# Patient Record
Sex: Male | Born: 1960 | Race: Asian | Hispanic: No | Marital: Single | Smoking: Former smoker
Health system: Southern US, Community
[De-identification: ages and names within clinical notes are randomized; demographics above are authoritative.]

## PROBLEM LIST (undated history)

## (undated) DIAGNOSIS — R197 Diarrhea, unspecified: Secondary | ICD-10-CM

## (undated) DIAGNOSIS — J90 Pleural effusion, not elsewhere classified: Secondary | ICD-10-CM

## (undated) DIAGNOSIS — H9319 Tinnitus, unspecified ear: Secondary | ICD-10-CM

## (undated) DIAGNOSIS — R7611 Nonspecific reaction to tuberculin skin test without active tuberculosis: Secondary | ICD-10-CM

## (undated) DIAGNOSIS — K635 Polyp of colon: Secondary | ICD-10-CM

## (undated) DIAGNOSIS — R200 Anesthesia of skin: Secondary | ICD-10-CM

## (undated) DIAGNOSIS — K5732 Diverticulitis of large intestine without perforation or abscess without bleeding: Secondary | ICD-10-CM

## (undated) DIAGNOSIS — K529 Noninfective gastroenteritis and colitis, unspecified: Secondary | ICD-10-CM

## (undated) DIAGNOSIS — N200 Calculus of kidney: Secondary | ICD-10-CM

## (undated) DIAGNOSIS — I10 Essential (primary) hypertension: Secondary | ICD-10-CM

## (undated) HISTORY — DX: Pleural effusion, not elsewhere classified: J90

## (undated) HISTORY — DX: Diarrhea, unspecified: R19.7

## (undated) HISTORY — DX: Tinnitus, unspecified ear: H93.19

## (undated) HISTORY — DX: Anesthesia of skin: R20.0

## (undated) HISTORY — DX: Calculus of kidney: N20.0

## (undated) HISTORY — DX: Polyp of colon: K63.5

## (undated) HISTORY — DX: Nonspecific reaction to tuberculin skin test without active tuberculosis: R76.11

## (undated) HISTORY — PX: COLON SURGERY: SHX602

## (undated) HISTORY — DX: Essential (primary) hypertension: I10

---

## 2001-11-30 HISTORY — PX: APPENDECTOMY: SHX54

## 2002-05-08 ENCOUNTER — Encounter (INDEPENDENT_AMBULATORY_CARE_PROVIDER_SITE_OTHER): Payer: Self-pay

## 2002-05-09 ENCOUNTER — Inpatient Hospital Stay (HOSPITAL_COMMUNITY): Admission: EM | Admit: 2002-05-09 | Discharge: 2002-05-12 | Payer: Self-pay | Admitting: Emergency Medicine

## 2003-06-25 ENCOUNTER — Encounter: Payer: Self-pay | Admitting: Surgery

## 2003-06-25 ENCOUNTER — Encounter: Admission: RE | Admit: 2003-06-25 | Discharge: 2003-06-25 | Payer: Self-pay | Admitting: Surgery

## 2008-04-25 ENCOUNTER — Encounter: Admission: RE | Admit: 2008-04-25 | Discharge: 2008-04-25 | Payer: Self-pay | Admitting: Otolaryngology

## 2009-12-31 DIAGNOSIS — K5732 Diverticulitis of large intestine without perforation or abscess without bleeding: Secondary | ICD-10-CM | POA: Diagnosis present

## 2009-12-31 HISTORY — PX: RIGHT COLECTOMY: SHX853

## 2009-12-31 HISTORY — DX: Diverticulitis of large intestine without perforation or abscess without bleeding: K57.32

## 2009-12-31 HISTORY — PX: CHOLECYSTECTOMY: SHX55

## 2010-01-10 ENCOUNTER — Emergency Department (HOSPITAL_COMMUNITY): Admission: EM | Admit: 2010-01-10 | Discharge: 2010-01-10 | Payer: Self-pay | Admitting: Emergency Medicine

## 2010-01-19 ENCOUNTER — Inpatient Hospital Stay (HOSPITAL_COMMUNITY): Admission: EM | Admit: 2010-01-19 | Discharge: 2010-01-28 | Payer: Self-pay | Admitting: Emergency Medicine

## 2010-01-23 HISTORY — PX: COLON SURGERY: SHX602

## 2010-01-24 ENCOUNTER — Encounter (INDEPENDENT_AMBULATORY_CARE_PROVIDER_SITE_OTHER): Payer: Self-pay

## 2010-06-05 ENCOUNTER — Encounter: Admission: RE | Admit: 2010-06-05 | Discharge: 2010-06-05 | Payer: Self-pay | Admitting: General Surgery

## 2010-06-06 ENCOUNTER — Ambulatory Visit: Payer: Self-pay | Admitting: Internal Medicine

## 2010-06-06 ENCOUNTER — Other Ambulatory Visit: Admission: RE | Admit: 2010-06-06 | Discharge: 2010-06-06 | Payer: Self-pay | Admitting: Internal Medicine

## 2010-06-06 DIAGNOSIS — J90 Pleural effusion, not elsewhere classified: Secondary | ICD-10-CM | POA: Insufficient documentation

## 2010-06-07 LAB — CONVERTED CEMR LAB: LD, Fluid: 482 units/L — ABNORMAL HIGH (ref 3–23)

## 2010-06-16 ENCOUNTER — Telehealth: Payer: Self-pay | Admitting: Internal Medicine

## 2010-06-18 ENCOUNTER — Encounter: Payer: Self-pay | Admitting: Internal Medicine

## 2010-07-24 ENCOUNTER — Ambulatory Visit: Payer: Self-pay | Admitting: Internal Medicine

## 2010-08-22 ENCOUNTER — Ambulatory Visit: Payer: Self-pay | Admitting: Internal Medicine

## 2010-08-22 DIAGNOSIS — R0600 Dyspnea, unspecified: Secondary | ICD-10-CM | POA: Insufficient documentation

## 2010-08-25 ENCOUNTER — Encounter: Payer: Self-pay | Admitting: Internal Medicine

## 2010-08-25 LAB — CONVERTED CEMR LAB
ALT: 11 units/L (ref 0–53)
AST: 17 units/L (ref 0–37)
Albumin: 3.9 g/dL (ref 3.5–5.2)
Alkaline Phosphatase: 82 units/L (ref 39–117)
BUN: 9 mg/dL (ref 6–23)
Basophils Absolute: 0 10*3/uL (ref 0.0–0.1)
Basophils Relative: 0.3 % (ref 0.0–3.0)
Bilirubin, Direct: 0.2 mg/dL (ref 0.0–0.3)
CO2: 30 meq/L (ref 19–32)
Calcium: 9.5 mg/dL (ref 8.4–10.5)
Chloride: 99 meq/L (ref 96–112)
Creatinine, Ser: 0.9 mg/dL (ref 0.4–1.5)
Eosinophils Absolute: 0.2 10*3/uL (ref 0.0–0.7)
Eosinophils Relative: 2.1 % (ref 0.0–5.0)
GFR calc non Af Amer: 95.96 mL/min (ref >60)
Glucose, Bld: 80 mg/dL (ref 70–99)
HCT: 38.1 % — ABNORMAL LOW (ref 39.0–52.0)
Hemoglobin: 12.5 g/dL — ABNORMAL LOW (ref 13.0–17.0)
Lymphocytes Relative: 10.7 % — ABNORMAL LOW (ref 12.0–46.0)
Lymphs Abs: 0.9 10*3/uL (ref 0.7–4.0)
MCHC: 32.9 g/dL (ref 30.0–36.0)
MCV: 88.6 fL (ref 78.0–100.0)
Monocytes Absolute: 0.6 10*3/uL (ref 0.1–1.0)
Monocytes Relative: 6.5 % (ref 3.0–12.0)
Neutro Abs: 7 10*3/uL (ref 1.4–7.7)
Neutrophils Relative %: 80.4 % — ABNORMAL HIGH (ref 43.0–77.0)
Platelets: 445 10*3/uL — ABNORMAL HIGH (ref 150.0–400.0)
Potassium: 3.9 meq/L (ref 3.5–5.1)
RBC: 4.3 M/uL (ref 4.22–5.81)
RDW: 14.4 % (ref 11.5–14.6)
Sed Rate: 55 mm/hr — ABNORMAL HIGH (ref 0–22)
Sodium: 138 meq/L (ref 135–145)
TSH: 0.37 microintl units/mL (ref 0.35–5.50)
Total Bilirubin: 0.7 mg/dL (ref 0.3–1.2)
Total Protein: 7.9 g/dL (ref 6.0–8.3)
WBC: 8.7 10*3/uL (ref 4.5–10.5)

## 2010-09-05 ENCOUNTER — Ambulatory Visit (HOSPITAL_COMMUNITY): Admission: RE | Admit: 2010-09-05 | Discharge: 2010-09-05 | Payer: Self-pay | Admitting: Internal Medicine

## 2010-09-11 ENCOUNTER — Ambulatory Visit: Payer: Self-pay | Admitting: Internal Medicine

## 2010-09-11 LAB — CONVERTED CEMR LAB: Sed Rate: 41 mm/hr — ABNORMAL HIGH (ref 0–22)

## 2010-10-09 ENCOUNTER — Ambulatory Visit: Payer: Self-pay | Admitting: Internal Medicine

## 2010-10-09 LAB — CONVERTED CEMR LAB: Sed Rate: 29 mm/hr — ABNORMAL HIGH (ref 0–22)

## 2010-11-04 ENCOUNTER — Encounter: Payer: Self-pay | Admitting: Internal Medicine

## 2010-11-05 ENCOUNTER — Ambulatory Visit: Payer: Self-pay | Admitting: Internal Medicine

## 2010-12-04 ENCOUNTER — Telehealth (INDEPENDENT_AMBULATORY_CARE_PROVIDER_SITE_OTHER): Payer: Self-pay | Admitting: *Deleted

## 2010-12-04 ENCOUNTER — Encounter
Admission: RE | Admit: 2010-12-04 | Discharge: 2010-12-04 | Payer: Self-pay | Source: Home / Self Care | Attending: Specialist | Admitting: Specialist

## 2010-12-08 ENCOUNTER — Ambulatory Visit
Admission: RE | Admit: 2010-12-08 | Discharge: 2010-12-08 | Payer: Self-pay | Source: Home / Self Care | Attending: Adult Health | Admitting: Adult Health

## 2010-12-08 DIAGNOSIS — R7611 Nonspecific reaction to tuberculin skin test without active tuberculosis: Secondary | ICD-10-CM

## 2010-12-08 HISTORY — DX: Nonspecific reaction to tuberculin skin test without active tuberculosis: R76.11

## 2010-12-10 ENCOUNTER — Ambulatory Visit
Admission: RE | Admit: 2010-12-10 | Discharge: 2010-12-10 | Payer: Self-pay | Source: Home / Self Care | Attending: Critical Care Medicine | Admitting: Critical Care Medicine

## 2010-12-10 DIAGNOSIS — R7611 Nonspecific reaction to tuberculin skin test without active tuberculosis: Secondary | ICD-10-CM | POA: Insufficient documentation

## 2010-12-11 ENCOUNTER — Telehealth: Payer: Self-pay | Admitting: Internal Medicine

## 2010-12-16 ENCOUNTER — Ambulatory Visit: Admit: 2010-12-16 | Payer: Self-pay | Admitting: Thoracic Surgery

## 2010-12-22 ENCOUNTER — Ambulatory Visit
Admission: RE | Admit: 2010-12-22 | Discharge: 2010-12-22 | Payer: Self-pay | Source: Home / Self Care | Attending: Internal Medicine | Admitting: Internal Medicine

## 2010-12-30 NOTE — Assessment & Plan Note (Signed)
Summary: Pulmonary/ ext f/u for L Pleural effusion    Copy to:  Dr. Johna Sheriff Primary Provider/Referring Provider:  none  CC:  Followup on thoracentesis results.  Pt states still has some chest discomfort and tightness- worse with inspiration.  He denies SOB or cough.  Appetite is poor.[name] Kitchen  History of Present Illness: 50 yo vietnamese male quit smoking 2010 extremely difficult hx due to language barrier, even thru fm member serving as interpreter  June 06, 2010  1st pulmonary office eval for large L effusion and co fatigue and sob x months. eval by [facility] with abd ct showing large effusion.  minimal L chest discomfort, wt loss ? amt. no cough or leg swelling. no fever or sweats rec tap -   600 cc serous yellow exudate 94% L  July 24, 2010 Followup on thoracentesis results.  Pt states still has some chest discomfort and tightness- worse with inspiration.  He denies SOB or cough.  Appetite is poor. Pt denies any significant sore throat, dysphagia, itching, sneezing,  nasal congestion or excess secretions,  fever, chills, sweats, unintended wt loss, pleuritic or exertional cp, hempoptysis, variability  in activity tolerance  orthopnea pnd or leg swelling. Pt also denies any obvious fluctuation in symptoms with weather or environmental change or other alleviating or aggravating factors.       Current Medications (verified): 1)  Tylenol 325 Mg Tabs (Acetaminophen) .... Per Bottle Instructions As Needed  Allergies (verified): No Known Drug Allergies  Past History:  Past Medical History: Diverticulosis     - s/p hemicolectomy 01/23/2010 L Pleural effusion     - Tcentesis June 06, 2010  600 cc serous yellow exudate 94% L     - Small/ moderated residual effuision July 24, 2010  Kidney stones      - bilateral, non obstructing by CT 06/05/10  Vital Signs:  Patient profile:   50 year old male Weight:      125.38 pounds O2 Sat:      95 % on Room air Temp:     98.1 degrees F oral Pulse  rate:   97 / minute BP sitting:   132 / 84  (right arm)  Vitals Entered By: [name] (July 24, 2010 9:03 AM)  O2 Flow:  Room air  Physical Exam  Additional Exam:  amb thin vietmamese male doesn't speak any English wt 129 > 125 July 24, 2010  HEENT: nl dentition, turbinates, and orophanx. Nl external ear canals without cough reflex NECK :  without JVD/Nodes/TM/ nl carotid upstrokes bilaterally LUNGS: no acc muscle use, decreased bs on left with dullness 2/3 up, without cough on insp or exp maneuvers CV:  RRR  no s3 or murmur or increase in P2, no edema  ABD:  soft and nontender with nl excursion in the supine position. No bruits or organomegaly, bowel sounds nl MS:  warm without deformities, calf tenderness, cyanosis or clubbing SKIN: warm and dry without lesions       CXR  Procedure date:  07/24/2010  Findings:      IMPRESSION: Significant decrease in size of the still large left pleural effusion since the examination 5 weeks ago.  Improved aeration in the left lower lobe and lingula, with moderate passive atelectasis persisting.  No new abnormalities.  CXR  Procedure date:  07/24/2010  Findings:      Significant decrease in size of the still large left pleural effusion since the examination 5 weeks ago.  Improved aeration in  the left lower lobe and lingula, with moderate passive atelectasis persisting.  No new abnormalities.  Impression & Recommendations:  Problem # 1:  PLEURAL EFFUSION (ICD-511.9) Recurrent but very slowly reaccumlulating and no need for intervention to day.  May benefit from flow cytometry on return but need him to come in with family member who speaks English to explain options or will need to arrange for interpreter in advance  DDX includes late parapneumonic, lymphoma or TB in that order.  Medications Added to Medication List This Visit: 1)  Tylenol 325 Mg Tabs (Acetaminophen) .... Per bottle instructions as needed  Other  Orders: T-2 View CXR (71020TC) Est. Patient Level IV (56213)  Patient Instructions: 1)  Please schedule a follow-up appointment in 3 weeks, sooner if needed with cxr on return and someone who speaks English to serve as interpreter

## 2010-12-30 NOTE — Assessment & Plan Note (Signed)
Summary: Pulmonary/ ext f/u with mod L effusion, esr 55/ ? vcd   Copy to:  Dr. Johna Sheriff Primary Provider/Referring Provider:  none  CC:  3 wk followup with cxr.  Pt c/o increased SOB- esp at in the early evening.  Pt states that he is SOB without any exertion at all.  Pt states has no appetite.  [name] Kitchen  History of Present Illness: 50 yo vietnamese male quit smoking 2010 extremely difficult hx due to language barrier, even thru fm member serving as interpreter  June 06, 2010  1st pulmonary office eval for large L effusion and co fatigue and sob x months. eval by [facility] with abd ct showing large effusion.  minimal L chest discomfort, wt loss ? amt. no cough or leg swelling. no fever or sweats rec tap -   600 cc serous yellow exudate 94% L> mature lymphocytes, rec flow cytometry if recurs.  July 24, 2010 Followup on thoracentesis results.  Pt states still has some chest discomfort and tightness- worse with inspiration.  He denies SOB or cough.  Appetite is poor.  cxr no real change, rec return in 3 weeks   August 22, 2010 3 wk followup with cxr.  Pt c/o increased SOB- esp at in the early evening.  Pt states that he is SOB without any exertion at all assoc with centralized cp with coughing only.   Pt states has no appetite.  Pt denies any significant sore throat, dysphagia, itching, sneezing,  nasal congestion or excess secretions,  fever, chills, sweats, unintended wt loss, pleuritic or exertional cp, hempoptysis, change in activity tolerance  orthopnea pnd or leg swelling    Current Medications (verified): 1)  Tylenol 325 Mg Tabs (Acetaminophen) .... Per Bottle Instructions As Needed  Allergies (verified): No Known Drug Allergies  Past History:  Past Medical History: Diverticulosis     - s/p hemicolectomy 01/23/2010 L Pleural effusion     - Tcentesis June 06, 2010  600 cc serous yellow exudate 94% L> mature lymphocytes     - Small/ moderated residual effuision July 24, 2010  Kidney  stones      - bilateral, non obstructing by CT 06/05/10  Vital Signs:  Patient profile:   50 year old male Weight:      123.38 pounds O2 Sat:      99 % on Room air Temp:     98.1 degrees F oral Pulse rate:   106 / minute BP sitting:   122 / 74  (left arm)  Vitals Entered By: [name] (August 22, 2010 11:44 AM)  O2 Flow:  Room air  Physical Exam  Additional Exam:  amb thin vietmamese male doesn't speak any English, hopeless and helpless affect/attitude with classic pseudowheeze resolves with purse lip maneuver  wt 129 > 125 July 24, 2010 > 123 August 22, 2010  HEENT: nl dentition, turbinates, and orophanx. Nl external ear canals without cough reflex NECK :  without JVD/Nodes/TM/ nl carotid upstrokes bilaterally LUNGS: no acc muscle use, decreased bs on left with dullness base only, without cough on insp or exp maneuvers CV:  RRR  no s3 or murmur or increase in P2, no edema  ABD:  soft and nontender with nl excursion in the supine position. No bruits or organomegaly, bowel sounds nl MS:  warm without deformities, calf tenderness, cyanosis or clubbing SKIN: warm and dry without lesions        Sodium  138 mEq/L                   135-145   Potassium                 3.9 mEq/L                   3.5-5.1   Chloride                  99 mEq/L                    96-112   Carbon Dioxide            30 mEq/L                    19-32   Glucose                   80 mg/dL                    04-54   BUN                       9 mg/dL                     0-98   Creatinine                0.9 mg/dL                   1.1-9.1   Calcium                   9.5 mg/dL                   4.7-82.9   GFR                       95.96 mL/min                >60  Tests: (2) CBC Platelet w/Diff (CBCD)   White Cell Count          8.7 K/uL                    4.5-10.5   Red Cell Count            4.30 Mil/uL                 4.22-5.81   Hemoglobin           [L]  12.5 g/dL                    56.2-13.0   Hematocrit           [L]  38.1 %                      39.0-52.0   MCV                       88.6 fl                     78.0-100.0   MCHC                      32.9 g/dL  30.0-36.0   RDW                       14.4 %                      11.5-14.6   Platelet Count       [H]  445.0 K/uL                  150.0-400.0   Neutrophil %         [H]  80.4 %                      43.0-77.0   Lymphocyte %         [L]  10.7 %                      12.0-46.0   Monocyte %                6.5 %                       3.0-12.0   Eosinophils%              2.1 %                       0.0-5.0   Basophils %               0.3 %                       0.0-3.0   Neutrophill Absolute      7.0 K/uL                    1.4-7.7   Lymphocyte Absolute       0.9 K/uL                    0.7-4.0   Monocyte Absolute         0.6 K/uL                    0.1-1.0  Eosinophils, Absolute                             0.2 K/uL                    0.0-0.7   Basophils Absolute        0.0 K/uL                    0.0-0.1  Tests: (3) Hepatic/Liver Function Panel (HEPATIC)   Total Bilirubin           0.7 mg/dL                   1.6-1.0   Direct Bilirubin          0.2 mg/dL                   9.6-0.4   Alkaline Phosphatase      82 U/L                      39-117   AST  17 U/L                      0-37   ALT                       11 U/L                      0-53   Total Protein             7.9 g/dL                    7.8-2.9   Albumin                   3.9 g/dL                    5.6-2.1  Tests: (4) TSH (TSH)   FastTSH                   0.37 uIU/mL                 0.35-5.50  Tests: (5) Sed Rate (ESR)   Sed Rate             [H]  55 mm/hr                    0-22  CXR  Procedure date:  08/22/2010  Findings:      No significant interval change in appearance of the chest with a moderate-sized left pleural effusion and associated compressive atelectasis.  Impression &  Recommendations:  Problem # 1:  PLEURAL EFFUSION (ICD-511.9) Chronic by cell count showing predominantly mature lymphs though have not excluded low grade lymphoma here and note esr is moderately elevated but don't have anything to compare this to.  It is not rapidly reaccumulating which would be typical of late organized parapneumonic process.  Only small amt posteriorly on lateral view with loops of bowel near it, not sure there is truly much raccumulation here - needs to return with lateral decub view  Problem # 2:  DYSPNEA (ICD-786.09)  Much more c/w airway problem than effusion, assoc with somewhat atypical centralized cp and vague gi c/os ? all gerd related  Try ppi two times a day x 2 weeks plus diet  Orders: Est. Patient Level IV (30865)  Medications Added to Medication List This Visit: 1)  Omeprazole 20 Mg Cpdr (Omeprazole) .... Take one 30-60 min before first and last meals of the day  Other Orders: Radiology Referral (Radiology) T-2 View CXR (71020TC) TLB-BMP (Basic Metabolic Panel-BMET) (80048-METABOL) TLB-CBC Platelet - w/Differential (85025-CBCD) TLB-Hepatic/Liver Function Pnl (80076-HEPATIC) TLB-TSH (Thyroid Stimulating Hormone) (84443-TSH) TLB-Sedimentation Rate (ESR) (85652-ESR)  Patient Instructions: 1)  Omeprazole 20 mg Take one 30-60 min before first and last meals of the day  2)  GERD (REFLUX)  is a common cause of respiratory symptoms. It commonly presents without heartburn and can be treated with medication, but also with lifestyle changes including avoidance of late meals, excessive alcohol, smoking cessation, and avoid fatty foods, chocolate, peppermint, colas, red wine, and acidic juices such as orange juice. NO MINT OR MENTHOL PRODUCTS SO NO COUGH DROPS  3)  USE SUGARLESS CANDY INSTEAD (jolley ranchers)  4)  NO OIL BASED VITAMINS 5)  Return in 2 weeks with a cxr to be done at [facility] a L Lateral decubitus  Prescriptions: OMEPRAZOLE 20 MG  CPDR  (OMEPRAZOLE) Take one 30-60 min before first and last meals of the day  #68 x 3   Entered and Authorized by:   Nyoka Cowden MD   Signed by:   Nyoka Cowden MD on 08/22/2010   Method used:   Print then Give to Patient   RxID:   [id number]

## 2010-12-30 NOTE — Assessment & Plan Note (Signed)
Summary: Pulmonary/ new pt eval for left effusion   Visit Type:  Initial Consult Copy to:  Dr. Johna Sheriff Primary Provider/Referring Provider:  none  CC:  Abnormal CT Chest.  History of Present Illness: 50 yo vietnamese male quit smoking 2010 extremely difficult hx due to language barrier, even thru fm member serving as interpreter  June 06, 2010  1st pulmonary office eval for large L effusion and co fatigue and sob x months. eval by [facility] with abd ct showing large effusion.  minimal L chest discomfort, wt loss ? amt. no cough or leg swelling. no fever or sweats  Current Medications (verified): 1)  None  Allergies (verified): No Known Drug Allergies  Past History:  Past Medical History: Diverticulosis     - s/p hemicolectomy 01/23/2010 L Pleural effusion     - Tcentesis June 06, 2010  600 cc serous yellow exudate 94% L Kidney stones      - bilateral, non obstructing by CT 06/05/10  Past Surgical History: Intestinal surgery 12/2009 = ileohemicolectomy and cholecystectomy Appendectomy 2003  Family History: no resp dz known  Social History: Single Children Former smoker.  Quit in 2010.  Smoked approx 40 yrs 1 ppd. 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  The patient denies shortness of breath with activity, productive cough, non-productive cough, coughing up blood, irregular heartbeats, acid heartburn, indigestion, abdominal pain, difficulty swallowing, sore throat, tooth/dental problems, nasal congestion/difficulty breathing through nose, sneezing, itching, ear ache, anxiety, depression, hand/feet swelling, joint stiffness or pain, rash, change in color of mucus, and fever.    Vital Signs:  Patient profile:   50 year old male Height:      63 inches Weight:      129 pounds BMI:     22.93 O2 Sat:      95 %  on Room air Temp:     98.2 degrees F oral Pulse rate:   105 / minute BP sitting:   94 / 60  (left arm)  Vitals Entered By: [name] (June 06, 2010 10:20 AM)  O2 Flow:  Room air  Physical Exam  Additional Exam:  amb thin vietmamese male doesn't speak any English HEENT: nl dentition, turbinates, and orophanx. Nl external ear canals without cough reflex NECK :  without JVD/Nodes/TM/ nl carotid upstrokes bilaterally LUNGS: no acc muscle use, decreased bs on left with dullness 2/3 up, without cough on insp or exp maneuvers CV:  RRR  no s3 or murmur or increase in P2, no edema  ABD:  soft and nontender with nl excursion in the supine position. No bruits or organomegaly, bowel sounds nl MS:  warm without deformities, calf tenderness, cyanosis or clubbing SKIN: warm and dry without lesions   NEURO:  alert, approp, no deficits   ! Color                     YELLOW                      YELLOW ! Clarity              [A]  HAZY                        CLEAR ! WBC                  [  H]  2680 cu mm                  0-1000 ! Neutrophil                4 %                         0-25 ! Lymphocyte                94 % ! Monocyte/Macrophage       1 % ! Eosinophil           [H]  1 %   Fluid Type                See Comment     UNKNOWN  Tests: (2) Glucose, Fluid (04540) ! Fluid Type                Y ! Glucose, Fluid            84 mg/dL     Fluid glucose levels of <60 mg/dL, or values of 40 mg/dL less than a     simultaneous serum level are considered decreased.  Tests: (3) Total Protein, Fluid (98119) ! Fluid Type                PLEURAL ! Total Protein, Fluid      5.3 g/dL  Tests: (4) LDH, Fluid (14782) ! Fluid Type                PLEURAL   LDH, Fluid           [H]  482 U/L                     3-23  Tests: (5) Triglycerides, Fluid (95621) ! Fluid Type                PLEURAL ! Triglyceride, Fluid       41 mg/dL  Tests: (6) Albumin, Fluid (30865) ! Albumin, Fluid            2.8 g/dL !  Fluid Type                PLEURAL  CXR  Procedure date:  06/06/2010  Findings:      Comparison: 01/10/2010   Findings: There is a new   large left pleural effusion.  There is significant consolidation / atelectasis in the left lower lung with air bronchograms.  Stable peripheral opacity at the left lung apex. Right lung clear.  Heart size difficult to assess due to adjacent opacity.  Vascular clips in the upper abdomen.   IMPRESSION:   1.  New   large left pleural effusion.    Impression & Recommendations:  Problem # 1:  PLEURAL EFFUSION (ICD-511.9) Sterile prep/ drape/1% xylocaine topically.  L thoracentesis performed using # 18 gauge needle at left base > 600 cc yellow serous fluid. Pt tolerated well, f/u cxr without pnemo    Findings: chronic exudative with predominant lymphocytes so could be tuberculous, malignant or late parapneumonic. Await cytology  Other Orders: T- * Misc. Laboratory test [id number]) T-2 View CXR (71020TC) New Patient Level III (62952) Thoracentesis  (84132)  Patient Instructions: 1)  we will call with reports

## 2010-12-30 NOTE — Assessment & Plan Note (Signed)
Summary: Pulmonary/ ext f/u ov with walking sats nl p 2 laps    Copy to:  Dr. Johna Sheriff Primary Corin Tilly/Referring Kellye Mizner:  none  CC:  Dyspnea- the same and no better or worse.Eduardo West  History of Present Illness: 50 yo vietnamese male quit smoking 2010 extremely difficult hx due to language barrier, even thru fm member serving as interpreter  June 06, 2010  1st pulmonary office eval for large L effusion and co fatigue and sob x months. eval by [facility] with abd ct showing large effusion.  minimal L chest discomfort, wt loss ? amt. no cough or leg swelling. no fever or sweats rec tap -   600 cc serous yellow exudate 94% L> mature lymphocytes, rec flow cytometry if recurs.  July 24, 2010 Followup on thoracentesis results.  Pt states still has some chest discomfort and tightness- worse with inspiration.  He denies SOB or cough.  Appetite is poor.  cxr no real change, rec return in 3 weeks   August 22, 2010 3 wk followup with cxr.  Pt c/o increased SOB- esp at in the early evening.  Pt states that he is SOB without any exertion at all assoc with centralized cp with coughing only. rec Omeprazole 20 mg Take one 30-60 min before first and last meals of the day  GERD  diet  September 11, 2010  cc Dyspnea- the same, no better or worse. not losing any weight.  no cough now. afraid to eat but not loosing any wt.  Pt denies any significant sore throat, dysphagia, itching, sneezing,  nasal congestion or excess secretions,  fever, chills, sweats, unintended wt loss, pleuritic or exertional cp, hempoptysis, change in activity tolerance  orthopnea pnd or leg swelling      Allergies (verified): No Known Drug Allergies  Past History:  Past Medical History: Diverticulosis     - s/p hemicolectomy 01/23/2010 L Pleural effusion     - Tcentesis June 06, 2010  600 cc serous yellow exudate 94% L>  mature lymphocytes     - Small/ moderated residual effusion  July 24, 2010 >> lat decub no sign layering  09/05/10 Kidney stones      - bilateral, non obstructing by CT 06/05/10  Vital Signs:  Patient profile:   50 year old male Weight:      124 pounds O2 Sat:      97 % on Room air Temp:     98.4 degrees F oral Pulse rate:   75 / minute BP sitting:   118 / 82  (left arm)  Vitals Entered By: [name] (September 11, 2010 11:32 AM)  O2 Flow:  Room air  Serial Vital Signs/Assessments:  Comments: 11:57 AM Ambulatory Pulse Oximetry  Resting; HR_78____    02 Sat_96%RA____  Lap1 (185 feet)   HR__96___   02 Sat_97%RA____ Lap2 (185 feet)   HR_90___   02 Sat_96%RA____    Lap3 (185 feet)   HR_____   02 Sat_____  ___Test Completed without Difficulty X__Test Stopped due ZO:XWRUEA 2 laps,c/o tired,requested to stop Elray Buba RN  September 11, 2010 11:59 AM    By: Elray Buba RN    Physical Exam  Additional Exam:  amb thin vietmamese male doesn't speak any English, hopeless and helpless affect/attitude with classic pseudowheeze resolves with purse lip maneuver  wt  125 July 24, 2010 > 123 August 22, 2010 > 124 September 11, 2010  HEENT: nl dentition, turbinates, and orophanx. Nl external ear canals without cough  reflex NECK :  without JVD/Nodes/TM/ nl carotid upstrokes bilaterally LUNGS: no acc muscle use, decreased bs on left with dullness base only, without cough on insp or exp maneuvers CV:  RRR  no s3 or murmur or increase in P2, no edema  ABD:  soft and nontender with nl excursion in the supine position. No bruits or organomegaly, bowel sounds nl MS:  warm without deformities, calf tenderness, cyanosis or clubbing SKIN: warm and dry without lesions       Sed Rate             [H]  41 mm/hr                    0-22  Impression & Recommendations:  Problem # 1:  PLEURAL EFFUSION (ICD-511.9)    Chronic by cell count showing predominantly mature lymphs though have not excluded low grade lymphoma here and note esr is moderately elevated but don't have anything to compare this  to.  It is not rapidly reaccumulating which would be typical of late organized parapneumonic process.  Only small amt posteriorly on lateral view with loops of bowel near it, not sure there is truly much raccumulation here -  lateral decub view > no free effusion  ESR  55  >>  41  September 11, 2010 so doubt lymphoma or empyema, follow conservatively  Problem # 2:  DYSPNEA (ICD-786.09)  Not able to reproduce this complaint, cough resolved, suspect this is all  Classic Upper airway cough syndrome, so named because it's frequently impossible to sort out how much is  CR/sinusitis with freq throat clearing (which can be related to primary GERD)   vs  causing  secondary extra esophageal GERD from wide swings in gastric pressure that occur with throat clearing, promoting self use of mint and menthol lozenges that reduce the lower esophageal sphincter tone and exacerbate the problem further These are the same pts who not infrequently have failed to tolerate ace inhibitors,  dry powder inhalers or biphosphonates or report having reflux symptoms that don't respond to standard doses of PPI   for now continue ppi high dose, f/u 4 weeks  Orders: Est. Patient Level IV (99214) Pulse Oximetry, Ambulatory (16109)  Other Orders: TLB-Sedimentation Rate (ESR) (85652-ESR)  Patient Instructions: 1)  Continue omeprazole 20mg   Take one 30-60 min before first and last meals of the day  2)  Please schedule a follow-up appointment in 4  weeks, sooner if needed  3)  Bring medications with you to all visits

## 2010-12-30 NOTE — Assessment & Plan Note (Signed)
Summary: Pulmonary/ f/u pleural effusion/ cp and sob    Copy to:  Dr. Johna Sheriff Primary Provider/Referring Provider:  none  CC:  4 week follow up.. Pt c/o increased SOB all the time with chest pain.  History of Present Illness: 50 yo vietnamese male quit smoking 2010 extremely difficult hx due to language barrier, even thru fm member serving as interpreter  June 06, 2010  1st pulmonary office eval for large L effusion and co fatigue and sob x months. eval by [facility] with abd ct showing large effusion.  minimal L chest discomfort, wt loss ? amt. no cough or leg swelling. no fever or sweats rec tap -   600 cc serous yellow exudate 94% L> mature lymphocytes, rec flow cytometry if recurs.  July 24, 2010 Followup on thoracentesis results.  Pt states still has some chest discomfort and tightness- worse with inspiration.  He denies SOB or cough.  Appetite is poor.  cxr no real change, rec return in 3 weeks   August 22, 2010 3 wk followup with cxr.  Pt c/o increased SOB- esp at in the early evening.  Pt states that he is SOB without any exertion at all assoc with centralized cp with coughing only. rec Omeprazole 20 mg Take one 30-60 min before first and last meals of the day  GERD  diet  September 11, 2010  cc Dyspnea- the same, no better or worse. not losing any weight.  no cough now. afraid to eat but not loosing any wt.  rec Continue omeprazole 20mg   Take one 30-60 min before first and last meals of the day  October 09, 2010  ov cp all the time but worse with deep breath and sob, not eating.  Pt denies any significant sore throat, dysphagia, itching, sneezing,  nasal congestion or excess secretions,  fever, chills, sweats, unintended wt loss,  exertional cp, hempoptysis, change in activity tolerance  orthopnea pnd or leg swelling. Pt also denies any obvious fluctuation in symptoms with weather or environmental change or other alleviating or aggravating factors.       Preventive  Screening-Counseling & Management  Alcohol-Tobacco     Smoking Status: quit     Packs/Day: 0.5     Year Quit: 2009  Current Medications (verified): 1)  Tylenol 325 Mg Tabs (Acetaminophen) .... Per Bottle Instructions As Needed 2)  Omeprazole 20 Mg  Cpdr (Omeprazole) .... Take One 30-60 Min Before First and Last Meals of The Day 3)  Oxycodone-Acetaminophen 5-325 Mg Tabs (Oxycodone-Acetaminophen) .... Take 1 To 2 Tablet By Mouth Every 4 Hours As Needed  Allergies (verified): No Known Drug Allergies   Past History:  Past Medical History: Diverticulosis     - s/p hemicolectomy 01/23/2010 L Pleural effusion     - Tcentesis June 06, 2010  600 cc serous yellow exudate 94% L>  mature lymphocytes     - Small/ moderated residual effusion  July 24, 2010 >> lat decub no sign layering 09/05/10     - ESR  55 > 40 > 29 October 09, 2010  Kidney stones      - bilateral, non obstructing by CT 06/05/10  Social History: Smoking Status:  quit Packs/Day:  0.5  Vital Signs:  Patient profile:   50 year old male Height:      63 inches Weight:      125.8 pounds BMI:     22.37 O2 Sat:      97 % on Room air Temp:  97.6 degrees F oral Pulse rate:   67 / minute BP sitting:   112 / 80  (right arm) Cuff size:   regular  Vitals Entered By: Zackery Barefoot CMA (October 09, 2010 11:11 AM)  O2 Flow:  Room air CC: 4 week follow up.. Pt c/o increased SOB all the time with chest pain Comments Medications reviewed with patient Verified contact number and pharmacy with patient Zackery Barefoot CMA  October 09, 2010 11:12 AM    Physical Exam  Additional Exam:  amb thin vietmamese male doesn't speak any English, hopeless and helpless affect/attitude with classic pseudowheeze resolves with purse lip maneuver  wt  125 July 24, 2010 > 123 August 22, 2010 > 124 September 11, 2010 > 125 October 09, 2010  HEENT: nl dentition, turbinates, and orophanx. Nl external ear canals without cough  reflex NECK :  without JVD/Nodes/TM/ nl carotid upstrokes bilaterally LUNGS: no acc muscle use, decreased bs on left with dullness base only, without cough on insp or exp maneuvers CV:  RRR  no s3 or murmur or increase in P2, no edema  ABD:  soft and nontender with nl excursion in the supine position. No bruits or organomegaly, bowel sounds nl MS:  warm without deformities, calf tenderness, cyanosis or clubbing SKIN: warm and dry without lesions     ESR = 29 (as 40 prev ov)  CXR  Procedure date:  10/09/2010  Findings:      No change in left effusion and basilar airspace disease  Impression & Recommendations:  Problem # 1:  PLEURAL EFFUSION (ICD-511.9)    ESR  55 > 40 > 29 October 09, 2010   Serial cxr's show if anything that his present status is improved vs the p thoracentesis view so doubt we can do any better here even with chest tube or vats, and there is no objective evidence of undrained pus and an unaddressed active pleural inflammatory issue.  Discussed in detail all the  indications, usual  risks and alternatives  relative to the benefits with patient who agrees to proceed with conservative rx.   Problem # 2:  DYSPNEA (ICD-786.09)  Not reproducible x 2 laps, suspect element of depression and deconditioning  Medications Added to Medication List This Visit: 1)  Oxycodone-acetaminophen 5-325 Mg Tabs (Oxycodone-acetaminophen) .... Take 1 to 2 tablet by mouth every 4 hours as needed  Other Orders: Pulse Oximetry, Ambulatory ([address]) Est. Patient Level IV (99214) T-2 View CXR (71020TC) TLB-Sedimentation Rate (ESR) (85652-ESR)  Patient Instructions: 1)   Please schedule a follow-up appointment in 4 weeks, sooner if needed with cxr on return

## 2010-12-30 NOTE — Letter (Signed)
Summary: [facility]  [address]   [place], [place] [address]   Phone: [phone]  Fax: [phone]    06/18/2010  KRISS ISHLER [address] [place], [place]  [address]  Dear Mr. SPANO,   Please call our office to schedule a followup appointment with Dr Sherene Sires to review your recent test results. Our number is [phone].        Sincerely,   [facility]

## 2010-12-30 NOTE — Assessment & Plan Note (Signed)
Summary: Pulmonary/ f/u  effusion > no change    Copy to:  Dr. Johna Sheriff Primary Provider/Referring Provider:  none  CC:  Dyspnea- " a little better"..  History of Present Illness: 50 yo vietnamese male quit smoking 2010 extremely difficult hx due to language barrier, even thru fm member serving as interpreter  June 06, 2010  1st pulmonary office eval for large L effusion and co fatigue and sob x months. eval by [facility] with abd ct showing large effusion.  minimal L chest discomfort, wt loss ? amt. no cough or leg swelling. no fever or sweats rec tap -   600 cc serous yellow exudate 94% L> mature lymphocytes, rec flow cytometry if recurs.  July 24, 2010 Followup on thoracentesis results.  Pt states still has some chest discomfort and tightness- worse with inspiration.  He denies SOB or cough.  Appetite is poor.  cxr no real change, rec return in 3 weeks   August 22, 2010 3 wk followup with cxr.  Pt c/o increased SOB- esp at in the early evening.  Pt states that he is SOB without any exertion at all assoc with centralized cp with coughing only. rec Omeprazole 20 mg Take one 30-60 min before first and last meals of the day  GERD  diet  September 11, 2010  cc Dyspnea- the same, no better or worse. not losing any weight.  no cough now. afraid to eat but not loosing any wt.  rec Continue omeprazole 20mg   Take one 30-60 min before first and last meals of the day  October 09, 2010  ov cp all the time but worse with deep breath and sob, not eating.   rec conservative f/u  November 05, 2010 ov no change doe/ cp  on ppi.  Pt denies any significant sore throat, dysphagia, itching, sneezing,  nasal congestion or excess secretions,  fever, chills, sweats, unintended wt loss,  classic pleuritic or exertional cp, hempoptysis, change in activity tolerance  orthopnea pnd or leg swelling   Current Medications (verified): 1)  Tylenol 325 Mg Tabs (Acetaminophen) .... Per Bottle Instructions As Needed 2)   Omeprazole 20 Mg  Cpdr (Omeprazole) .... Take One 30-60 Min Before First and Last Meals of The Day 3)  Oxycodone-Acetaminophen 5-325 Mg Tabs (Oxycodone-Acetaminophen) .... Take 1 To 2 Tablet By Mouth Every 4 Hours As Needed  Allergies (verified): No Known Drug Allergies  Past History:  Past Medical History: Diverticulosis     - s/p hemicolectomy 01/23/2010 L Pleural effusion     - Tcentesis June 06, 2010  600 cc serous yellow exudate 94% L>  mature lymphocytes     - Small/ moderated residual effusion  July 24, 2010 >>   lat decub no sign layering 09/05/10     - ESR  55 > 40 > 29 October 09, 2010  Kidney stones      - bilateral, non obstructing by CT 06/05/10  Vital Signs:  Patient profile:   50 year old male Weight:      126.13 pounds O2 Sat:      97 % on Room air Temp:     98.0 degrees F oral Pulse rate:   83 / minute BP sitting:   130 / 96  (right arm)  Vitals Entered By: [name] (November 05, 2010 11:04 AM)  O2 Flow:  Room air  Physical Exam  Additional Exam:  amb thin vietmamese male doesn't speak any English, hopeless and helpless  affect/attitude with classic pseudowheeze resolves with purse lip maneuver  wt  125 July 24, 2010 > 123 August 22, 2010 > 124 September 11, 2010 > 125 October 09, 2010 > 126 November 05, 2010  HEENT: nl dentition, turbinates, and orophanx. Nl external ear canals without cough reflex NECK :  without JVD/Nodes/TM/ nl carotid upstrokes bilaterally LUNGS: no acc muscle use, decreased bs on left with dullness base only, without cough on insp or exp maneuvers CV:  RRR  no s3 or murmur or increase in P2, no edema  ABD:  soft and nontender with nl excursion in the supine position. No bruits or organomegaly, bowel sounds nl MS:  warm without deformities, calf tenderness, cyanosis or clubbing SKIN: warm and dry without lesions       Impression & Recommendations:  Problem # 1:  PLEURAL EFFUSION (ICD-511.9)    ESR  55 > 40 > 29 October 09, 2010   no evidence of significant worsening clinically or radiographically > continue to follow conservatively  See instructions for specific recommendations   Orders: Est. Patient Level III ([address])  Patient Instructions: 1)  Please schedule a follow-up appointment in 6 weeks, sooner if needed  2)  Try off the opmeprazole to see if it makes any difference

## 2010-12-30 NOTE — Letter (Signed)
Summary: [facility]  [address]   [place], [place] [address]   Phone: [phone]  Fax: [phone]    08/25/2010  HOYT LEANOS [address] [place], [place]  [address]  Dear Mr. RUSSETT,   Please call our office at [phone] to obtain recent lab results. Thank You.        Sincerely,   [facility]

## 2010-12-30 NOTE — Miscellaneous (Signed)
Summary: Orders Update  Clinical Lists Changes  Orders: Added new Test order of T-2 View CXR (71020TC) - Signed 

## 2010-12-30 NOTE — Progress Notes (Signed)
Summary: Cytology still pending,  needs f/u ov----[id number]  Phone Note Call from Patient   Caller: Patient Call For: Gibril Mastro Summary of Call: pt wants lab results [phone] Initial call taken by: [name], CNA,  June 16, 2010 2:30 PM  Follow-up for Phone Call        pt calling for test results of thoracentesis done at OV with MW on 06-06-2010.  Please advise.  Thanks.  Aundra Millet Reynolds LPN  June 16, 2010 2:39 PM  cytology still pending, check results - he'll need f/u anyway this week so get him to come in Thursday am with cxr Follow-up by: Nyoka Cowden MD,  June 16, 2010 3:01 PM  Additional Follow-up for Phone Call Additional follow up Details #1::        ATC pt at # provided.  Line busy.  Will try back later.  Aundra Millet Reynolds LPN  June 16, 2010 3:02 PM     Additional Follow-up for Phone Call Additional follow up Details #2::    [id number].  Aundra Millet Reynolds LPN  June 16, 2010 3:53 PM   ATC pt back pt- someone answered but did not speak english and [place] up the phone- Will try again later [name]  June 17, 2010 11:15 AM   called number given and this lady keeps answering the phone and does not understand what i am saying and hangs up the phone.  will await for pt to call back for results [name] CMA  June 18, 2010 9:08 AM

## 2011-01-01 NOTE — Assessment & Plan Note (Signed)
Summary: Acute NP office visit - dyspnea   Copy to:  Dr. Johna Sheriff Primary Provider/Referring Provider:  none  CC:  acute ov today for SOB especially at night, some heaviness in chest, and occ dry cough - worse since last ov x1week.  History of Present Illness: 50 yo vietnamese male quit smoking 2010 extremely difficult hx due to language barrier, even thru fm member serving as interpreter  June 06, 2010  1st pulmonary office eval for large L effusion and co fatigue and sob x months. eval by [facility] with abd ct showing large effusion.  minimal L chest discomfort, wt loss ? amt. no cough or leg swelling. no fever or sweats rec tap -   600 cc serous yellow exudate 94% L> mature lymphocytes, rec flow cytometry if recurs.  July 24, 2010 Followup on thoracentesis results.  Pt states still has some chest discomfort and tightness- worse with inspiration.  He denies SOB or cough.  Appetite is poor.  cxr no real change, rec return in 3 weeks   August 22, 2010 3 wk followup with cxr.  Pt c/o increased SOB- esp at in the early evening.  Pt states that he is SOB without any exertion at all assoc with centralized cp with coughing only. rec Omeprazole 20 mg Take one 30-60 min before first and last meals of the day  GERD  diet  September 11, 2010  cc Dyspnea- the same, no better or worse. not losing any weight.  no cough now. afraid to eat but not loosing any wt.  rec Continue omeprazole 20mg   Take one 30-60 min before first and last meals of the day  October 09, 2010  ov cp all the time but worse with deep breath and sob, not eating.   rec conservative f/u  November 05, 2010 ov no change doe/ cp  on ppi.    December 08, 2010 --Presents for an acute office visit.Complains of  SOB especially at night, some heaviness in chest, occ dry cough. Comes and goes. Was seen by PCP last week and sent for xray which showed no change in left effusion. He cont to feel bad w/no energy.Cough persists w/ intermittent  fevers, pain in left rib area. Interpreter from [employer] is here. Denies orthopnea, hemoptysis, fever, n/v/d, edema, headache.  Medications Prior to Update: 1)  Tylenol 325 Mg Tabs (Acetaminophen) .... Per Bottle Instructions As Needed 2)  Oxycodone-Acetaminophen 5-325 Mg Tabs (Oxycodone-Acetaminophen) .... Take 1 To 2 Tablet By Mouth Every 4 Hours As Needed  Current Medications (verified): 1)  Tylenol 325 Mg Tabs (Acetaminophen) .... Per Bottle Instructions As Needed 2)  Oxycodone-Acetaminophen 5-325 Mg Tabs (Oxycodone-Acetaminophen) .... Take 1 To 2 Tablet By Mouth Every 4 Hours As Needed  Allergies (verified): No Known Drug Allergies  Past History:  Past Surgical History: Last updated: 06/06/2010 Intestinal surgery 12/2009 = ileohemicolectomy and cholecystectomy Appendectomy 2003  Family History: Last updated: 06/06/2010 no resp dz known  Social History: Last updated: 12/08/2010 Single Children Former smoker.  Quit in 2010.  Smoked approx 40 yrs 1 ppd. 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 abscess2/24/2011  L Pleural effusion     - Tcentesis June 06, 2010  600 cc serous yellow exudate 94% L>  mature lymphocytes     - Small/ moderated residual effusion  July 24, 2010 >>   lat decub no sign layering 09/05/10     - ESR  55 > 40 > 29 October 09, 2010  Kidney stones      - bilateral, non obstructing by CT 06/05/10  Social History: Single Children Former smoker.  Quit in 2010.  Smoked approx 40 yrs 1 ppd. 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 Has been in [place] since 1992 Does not speak english  4 people live in home (kids/girlfriend)   Review of Systems      See  HPI  Vital Signs:  Patient profile:   50 year old male Height:      63 inches Weight:      129.38 pounds BMI:     23.00 O2 Sat:      98 % on Room air Temp:     97.9 degrees F oral Pulse rate:   89 / minute BP sitting:   144 / 84  (left arm) Cuff size:   regular  Vitals Entered By: Boone Master CNA/MA (December 08, 2010 9:04 AM)  O2 Flow:  Room air CC: acute ov today for SOB especially at night, some heaviness in chest, occ dry cough - worse since last ov x1week Is Patient Diabetic? No Comments Medications reviewed with patient Daytime contact number verified with patient. Boone Master CNA/MA  December 08, 2010 9:05 AM    Physical Exam  Additional Exam:  amb thin vietmamese male doesn't speak any English, hopeless and helpless affect/attitude   wt  125 July 24, 2010 > 123 August 22, 2010 > 124 September 11, 2010 > 125 October 09, 2010 > 126 November 05, 2010 >129 December 08, 2010 HEENT: nl dentition, turbinates, and orophanx. Nl external ear canals without cough reflex NECK :  without JVD/Nodes/TM/ nl carotid upstrokes bilaterally LUNGS: coarse BS w/ diminshed BS in bases, tender along mid and lateral chest wall.  CV:  RRR  no s3 or murmur or increase in P2, no edema  ABD:  soft and nontender with nl excursion in the supine position. No bruits or organomegaly, bowel sounds nl MS:  warm without deformities, calf tenderness, cyanosis or clubbing SKIN: warm and dry without lesions       Impression & Recommendations:  Problem # 1:  PLEURAL EFFUSION (ICD-511.9)  Persistent left pleural effusion ?associated infection  will tx w/ abx-Avelox  warm heat /nsaids.  place PPD  Plan:  You will need to return on 12/10/10- Wednesday this week.  Warm heat to chest ,  Advil 200mg  2 tabs two times a day for 5 days Avelox 400mg  once daily for 7 days  follow up Dr. Sherene Sires in 1 week  Please contact office for sooner follow up if symptoms do not improve or worsen   Orders: Est.  Patient Level IV (04540)  Medications Added to Medication List This Visit: 1)  Avelox 400 Mg Tabs (Moxifloxacin hcl) .[name] Kitchen.. 1 by mouth once daily  Patient Instructions: 1)  You will need to return on 12/10/10- Wednesday this week.  2)  Warm heat to chest ,  3)  Advil 200mg  2 tabs two times a day for 5 days 4)  Avelox 400mg  once daily for 7 days  5)  follow up Dr. Sherene Sires in 1 week  6)  Please contact office for sooner follow  up if symptoms do not improve or worsen  Prescriptions: AVELOX 400 MG TABS (MOXIFLOXACIN HCL) 1 by mouth once daily  #7 x 0   Entered and Authorized by:   Rubye Oaks NP   Signed by:   Rubye Oaks NP on 12/08/2010   Method used:   Electronically to        [facility]. [phone]* (retail)       [address]       [place], [place]  [address]       Ph: [phone]       Fax: [phone]   RxID:   [id number]   Appended Document: Acute NP office visit - dyspnea    Clinical Lists Changes  Medications: Rx of AVELOX 400 MG TABS (MOXIFLOXACIN HCL) 1 by mouth once daily;  #7 x 0;  Signed;  Entered by: Boone Master CNA/MA;  Authorized by: Mishel Sans NP;  Method used: Reprint    Prescriptions: AVELOX 400 MG TABS (MOXIFLOXACIN HCL) 1 by mouth once daily  #7 x 0   Entered by:   Boone Master CNA/MA   Authorized by:   Rubye Oaks NP   Signed by:   Boone Master CNA/MA on 12/08/2010   Method used:   Reprint   RxID:   [id number]   pt requested a hard-copy of his rx for avelox as he has to provide ID.  rx printed, signed by TP and handed to pt. Boone Master CNA/MA  December 08, 2010 10:07 AM   Appended Document: PPD placement    Clinical Lists Changes  Orders: Added new Service order of TB Skin Test [phone]) - Signed Added new Service order of Admin 1st Vaccine (56433) - Signed Observations: Added new observation of TB PPDRESULT: positive (12/08/2010 10:11) Added new observation of PPD RESULT: > 15mm  (12/08/2010 10:11) Added new observation of TB-PPD RDDTE: 12/10/2010 (12/08/2010 10:11) Added new observation of TB-PPD LOT#: [id number] (12/08/2010 10:11) Added new observation of TB-PPD EXP: 03/25/2012 (12/08/2010 10:11) Added new observation of TB-PPD BY: Boone Master CNA/MA (12/08/2010 10:11) Added new observation of TB-PPD RTE: ID (12/08/2010 10:11) Added new observation of TB-PPD DSE: 0.1 ml (12/08/2010 10:11) Added new observation of TB-PPD MFR: Sanofi Pasteur (12/08/2010 10:11) Added new observation of TB-PPD SITE: right forearm (12/08/2010 10:11) Added new observation of TB-PPD: PPD (12/08/2010 10:11)       Immunizations Administered:  PPD Skin Test:    Vaccine Type: PPD    Site: right forearm    Mfr: Sanofi Pasteur    Dose: 0.1 ml    Route: ID    Given by: Boone Master CNA/MA    Exp. Date: 03/25/2012    Lot #: [id number]  PPD Results    Date of reading: 12/10/2010    Results: > 15mm    Interpretation: positive   Appended Document: Acute NP office visit - dyspnea Pt was seen by Dr. Delford Field today for positive PPD.

## 2011-01-01 NOTE — Progress Notes (Signed)
Summary: pleural effusion- dr Mayford Knife office calling  Phone Note From Other Clinic   Caller: Sander Radon w/ dr Karren Burly williams  Call For: wert Summary of Call: pt had a cxr today per caller that shows pleural effusion. caller wants to know if dr wert was going to have a thoracentesis set up. (pt having SOB). call Sander Radon at [phone] asap Initial call taken by: [name], CNA,  December 04, 2010 1:59 PM  Follow-up for Phone Call        Spoke with Pink.  Pt seeing Dr Mayford Knife as PCP now.  Was seen there today, having some SOB and cxr was ordered.  Dr Mayford Knife was wanting to know if MW planned to do another thora any time soon.  I advised that we just saw the pt x 1 month ago and will fax over the last note to him, and also will fax copy of last cytology per her request to [phone].  Dr Sherene Sires, report todays cxr states effusion is stable in comparison with films from 12/7.  Pls advise thanks! Follow-up by: [name],  December 04, 2010 4:46 PM  Additional Follow-up for Phone Call Additional follow up Details #1::        will need to see pt in office if breathing is worse regardless of whether effusion is worse as he is very difficult to sort out even with interpreter present   Additional Follow-up by: Nyoka Cowden MD,  December 04, 2010 5:15 PM    Additional Follow-up for Phone Call Additional follow up Details #2::    I called and spoke with pt's friend [place], who comes with him to all of his appts here.  I advised that if pt's breathing is worse, then we need to get him in for appt asap.  I asked that he pls call the pt and discuss this with him and call for appt tommorrow.  Will await a call back [name]  December 04, 2010 5:41 PM  Spoke with pr's friend [place].  Pt still c/o increased SOB. Appt with Tammy Parrett on 12-08-10 at 9:00.  Called for translator for appt.Abigail Miyamoto RN  December 05, 2010 1:18 PM

## 2011-01-01 NOTE — Assessment & Plan Note (Signed)
Summary: Pulmonary/ ext f/u ov    Copy to:  Dr. Johna Sheriff Primary Provider/Referring Provider:  none  CC:  Dyspnea- the same.  History of Present Illness: 50 yo vietnamese male quit smoking 2010 extremely difficult hx due to language barrier, even thru fm member serving as interpreter  June 06, 2010  1st pulmonary office eval for large L effusion and co fatigue and sob x months. eval by [facility] with abd ct showing large effusion.  minimal L chest discomfort, wt loss ? amt. no cough or leg swelling. no fever or sweats rec tap -   600 cc serous yellow exudate 94% L> mature lymphocytes, rec flow cytometry if recurs.  July 24, 2010 Followup on thoracentesis results.  Pt states still has some chest discomfort and tightness- worse with inspiration.  He denies SOB or cough.  Appetite is poor.  cxr no real change, rec return in 3 weeks   August 22, 2010 3 wk followup with cxr.  Pt c/o increased SOB- esp at in the early evening.  Pt states that he is SOB without any exertion at all assoc with centralized cp with coughing only. effusion did not worsen vs the pots tap previous xrary therefore rec Omeprazole 20 mg Take one 30-60 min before first and last meals of the day  GERD  diet  September 11, 2010  cc Dyspnea- the same, no better or worse. not losing any weight.  no cough now. afraid to eat but not loosing any wt.  rec Continue omeprazole 20mg   Take one 30-60 min before first and last meals of the day  October 09, 2010  ov cp all the time but worse with deep breath and sob, not eating.   rec conservative f/u  November 05, 2010 ov no change doe/ cp  on ppi.   December 08, 2010 --Presents for an acute office visit.Complains of  SOB especially at night, some heaviness in chest, occ dry cough. Comes and goes. Was seen by PCP last week and sent for xray which showed no change in left effusion. He cont to feel bad w/no energy.Cough persists w/ intermittent fevers, pain in left rib area. Interpreter from  [place]. rec IPPD  December 10, 2010 seen by Dr Delford Field PPD is positive.  Has chronic L effusion.  No family exposure.  Came to [place] 1992.  Was pos on PPD in the past took 9months INH, 1992 Now with chronic effusion.    Pt cont to have dry cough and weakness.  Pt has intermittent fever.  Pt cont to feel very bad. 2-3 months ago had fever and sweating.  rec health dept eval and t surgery eval  December 22, 2010 ov no apparent change in any symptoms x tired from being on tb rx per Health dept.  no convincing NS, wt loss, no change on serial cxrs since his orginal tap. Pt denies any significant sore throat, dysphagia, itching, sneezing,  nasal congestion or excess secretions,  fever, chills, sweats, unintended wt loss, classic or persistent pleuritic or exertional cp, hempoptysis, change in activity tolerance  orthopnea pnd or leg swelling   Current Medications (verified): 1)  Tylenol 325 Mg Tabs (Acetaminophen) .... Per Bottle Instructions As Needed 2)  Oxycodone-Acetaminophen 5-325 Mg Tabs (Oxycodone-Acetaminophen) .... Take 1 To 2 Tablet By Mouth Every 4 Hours As Needed 3)  Isoniazid 300 Mg Tabs (Isoniazid) .[name] Kitchen.. 1 Once Daily 4)  Pyrazinamide 500 Mg Tabs (Pyrazinamide) .... 3 Once Daily 5)  Vitamin B-6  25 Mg Tabs (Pyridoxine Hcl) .[name] Kitchen.. 1 Once Daily 6)  Myambutol 400 Mg Tabs (Ethambutol Hcl) .... 3 Once Daily 7)  Rifampin 300 Mg Caps (Rifampin) .... 2 Once Daily  Allergies (verified): No Known Drug Allergies  Past History:  Past Medical History: Diverticulosis     - s/p hemicolectomy and cholecystectomy for ? abscess 01/23/2010  L Pleural effusion developed post op     - Tcentesis June 06, 2010  600 cc serous yellow exudate 94% L>  mature lymphocytes     - Small/ moderated residual effusion  July 24, 2010 >>   lat decub no sign layering 09/05/10     - ESR  55 > 40 > 29 October 09, 2010  Kidney stones      - bilateral, non obstructing by CT 06/05/10  Vital Signs:  Patient profile:   50 year old  male Weight:      128.25 pounds O2 Sat:      97 % on Room air Temp:     97.5 degrees F oral Pulse rate:   106 / minute BP sitting:   160 / 90  (left arm)  Vitals Entered By: [name] (December 22, 2010 10:11 AM)  O2 Flow:  Room air  Physical Exam  Additional Exam:  amb thin vietmamese male doesn't speak any English wt  125 July 24, 2010 > 123 August 22, 2010 > 124 September 11, 2010 > 125 October 09, 2010 > 126 November 05, 2010 >129 December 08, 2010 > 128 December 22, 2010  HEENT: nl dentition, turbinates, and orophanx. Nl external ear canals without cough reflex NECK :  without JVD/Nodes/TM/ nl carotid upstrokes bilaterally LUNGS: coarse BS w/ diminshed BS in bases, tender along mid and lateral chest wall.  CV:  RRR  no s3 or murmur or increase in P2, no edema  ABD:  soft and nontender with nl excursion in the supine position. No bruits or organomegaly, bowel sounds nl MS:  warm without deformities, calf tenderness, cyanosis or clubbing SKIN: warm and dry without lesions       Impression & Recommendations:  Problem # 1:  PLEURAL EFFUSION (ICD-511.9)  This effusion only occurred in a post op setting where he had previously develped an abscess therefore not at all likley to be TB Needs Gamma Interferon Assay if not already done.  I had an extended discussion with the patient today lasting 15 to 20 minutes of a 25 minute visit and subsequent to the ov with Dr Burnice Logan at the health dept:  no need for vats, rx until sputum returns then Dr Roxan Hockey will decide best option and return here when released by Dr Roxan Hockey and the health dept:     Medications Added to Medication List This Visit: 1)  Isoniazid 300 Mg Tabs (Isoniazid) .[name] Kitchen.. 1 once daily 2)  Pyrazinamide 500 Mg Tabs (Pyrazinamide) .... 3 once daily 3)  Vitamin B-6 25 Mg Tabs (Pyridoxine hcl) .[name] Kitchen.. 1 once daily 4)  Myambutol 400 Mg Tabs (Ethambutol hcl) .... 3 once daily 5)  Rifampin 300 Mg Caps (Rifampin) .... 2  once daily  Other Orders: Est. Patient Level IV (16109)  Patient Instructions: 1)  When the health dept dismisses you then set up appt to return to see me to re-establish follow up 2)

## 2011-01-01 NOTE — Progress Notes (Signed)
Summary: question about TB diagnosis  Phone Note From Other Clinic Call back at [phone] or [phone]   Caller: [name] RN at health dept Call For: MW Summary of Call: Spoke to [name] and she states they received the pt paperwork at the health dept but the MD there wants to know why now do we think the pt has TB, because there has been no change in his pleural effusion and the last thoracentisis did not show TB. She was also requesting I fax over cyology report from last thora. which I did.  Please advise. Carron Curie CMA  December 11, 2010 11:21 AM I strongly doubt TB and suspect this was a reaction to BCG but didn't know about the referral until after the fact.  If they agree with me then no further f/u from them is needed  Initial call taken by: Nyoka Cowden MD,  December 11, 2010 1:30 PM  Follow-up for Phone Call        I spoke with [name] at health dept and advised of MW recs as stated above. Carron Curie CMA  December 11, 2010 2:24 PM

## 2011-01-01 NOTE — Assessment & Plan Note (Signed)
Summary: Pulmonary OV   Copy to:  Dr. Johna Sheriff Primary Provider/Referring Provider:  none  CC:  Follow up - ppd.  .  History of Present Illness: 50 yo vietnamese male quit smoking 2010 extremely difficult hx due to language barrier, even thru fm member serving as interpreter  June 06, 2010  1st pulmonary office eval for large L effusion and co fatigue and sob x months. eval by [facility] with abd ct showing large effusion.  minimal L chest discomfort, wt loss ? amt. no cough or leg swelling. no fever or sweats rec tap -   600 cc serous yellow exudate 94% L> mature lymphocytes, rec flow cytometry if recurs.  July 24, 2010 Followup on thoracentesis results.  Pt states still has some chest discomfort and tightness- worse with inspiration.  He denies SOB or cough.  Appetite is poor.  cxr no real change, rec return in 3 weeks   August 22, 2010 3 wk followup with cxr.  Pt c/o increased SOB- esp at in the early evening.  Pt states that he is SOB without any exertion at all assoc with centralized cp with coughing only. rec Omeprazole 20 mg Take one 30-60 min before first and last meals of the day  GERD  diet  September 11, 2010  cc Dyspnea- the same, no better or worse. not losing any weight.  no cough now. afraid to eat but not loosing any wt.  rec Continue omeprazole 20mg   Take one 30-60 min before first and last meals of the day  October 09, 2010  ov cp all the time but worse with deep breath and sob, not eating.   rec conservative f/u  November 05, 2010 ov no change doe/ cp  on ppi.    December 08, 2010 --Presents for an acute office visit.Complains of  SOB especially at night, some heaviness in chest, occ dry cough. Comes and goes. Was seen by PCP last week and sent for xray which showed no change in left effusion. He cont to feel bad w/no energy.Cough persists w/ intermittent fevers, pain in left rib area. Interpreter from [place] is here. Denies orthopnea, hemoptysis, fever, n/v/d, edema,  headache.  December 10, 2010 10:59 AM PPD is positive.  Has chronic L effusion.  No family exposure.  Came to [place] 1992.  Was pos on PPD in the past took 9months INH, 1992 Now with chronic effusion.    Pt cont to have dry cough and weakness.  Pt has intermittent fever.  Pt cont to feel very bad. 2-3 months ago had fever and sweating.    Preventive Screening-Counseling & Management  Alcohol-Tobacco     Smoking Status: quit     Packs/Day: 0.5     Year Quit: 2009  Current Medications (verified): 1)  Tylenol 325 Mg Tabs (Acetaminophen) .... Per Bottle Instructions As Needed 2)  Oxycodone-Acetaminophen 5-325 Mg Tabs (Oxycodone-Acetaminophen) .... Take 1 To 2 Tablet By Mouth Every 4 Hours As Needed 3)  Avelox 400 Mg Tabs (Moxifloxacin Hcl) .[name] Kitchen.. 1 By Mouth Once Daily  Allergies (verified): No Known Drug Allergies  Past History:  Past medical, surgical, family and social histories (including risk factors) reviewed, and no changes noted (except as noted below).  Past Medical History: Reviewed history from 12/08/2010 and no changes required. Diverticulosis     - s/p hemicolectomy and cholecystectomy for ? abscess2/24/2011  L Pleural effusion     - Tcentesis June 06, 2010  600 cc serous yellow  exudate 94% L>  mature lymphocytes     - Small/ moderated residual effusion  July 24, 2010 >>   lat decub no sign layering 09/05/10     - ESR  55 > 40 > 29 October 09, 2010  Kidney stones      - bilateral, non obstructing by CT 06/05/10  Past Surgical History: Reviewed history from 06/06/2010 and no changes required. Intestinal surgery 12/2009 = ileohemicolectomy and cholecystectomy Appendectomy 2003  Family History: Reviewed history from 06/06/2010 and no changes required. no resp dz known  Social History: Reviewed history from 12/08/2010 and no changes required. Single Children Former smoker.  Quit in 2010.  Smoked approx 40 yrs 1 ppd. 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  The patient denies coughing up blood, chest pain, irregular heartbeats, acid heartburn, indigestion, loss of appetite, abdominal pain, difficulty swallowing, sore throat, tooth/dental problems, headaches, nasal congestion/difficulty breathing through nose, sneezing, itching, ear ache, anxiety, depression, hand/feet swelling, joint stiffness or pain, and rash.         night sweats  Vital Signs:  Patient profile:   50 year old male O2 Sat:      95 % on Room air Temp:     97.0 degrees F oral Pulse rate:   77 / minute BP sitting:   110 / 84  (left arm) Cuff size:   regular  Vitals Entered By: Gweneth Dimitri RN (December 10, 2010 9:56 AM)  O2 Flow:  Room air CC: Follow up - ppd.   Comments Medications reviewed with patient Daytime contact number verified with patient. Gweneth Dimitri RN  December 10, 2010 10:37 AM    Physical Exam  Additional Exam:  amb thin vietmamese male doesn't speak any English, wt  125 July 24, 2010 > 123 August 22, 2010 > 124 September 11, 2010 > 125 October 09, 2010 > 126 November 05, 2010 >129 December 08, 2010 HEENT: nl dentition, turbinates, and orophanx. Nl external ear canals without cough reflex NECK :  without JVD/Nodes/TM/ nl carotid upstrokes bilaterally LUNGS: coarse BS w/ diminshed BS in bases, tender along mid and lateral chest wall.  CV:  RRR  no s3 or murmur or increase in P2, no edema  ABD:  soft and nontender with nl excursion in the supine position. 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  This pt received INH in 1993 for 9months with pos PPD upon entry to this country ?recrudescent TB plan referral to [facility] for 5 drug therapy referral to Dr Edwyna Shell for vats pleural biopsy for r/o granulomas and culture of pleural space for TB close contacts evaluation Orders: Est. Patient Level V (28413) Surgical Referral (Surgery) Misc. Referral (Misc. Ref)  Complete Medication List: 1)  Tylenol 325 Mg Tabs (Acetaminophen) .... Per bottle instructions as needed 2)  Oxycodone-acetaminophen 5-325 Mg Tabs (  Oxycodone-acetaminophen) .... Take 1 to 2 tablet by mouth every 4 hours as needed 3)  Avelox 400 Mg Tabs (Moxifloxacin hcl) .[name] Kitchen.. 1 by mouth once daily  Patient Instructions: 1)  You will be referred to the health Dept for TB treatment 2)  You will be referred to Dr Edwyna Shell, for a biopsy of your lung

## 2011-01-14 ENCOUNTER — Encounter: Payer: Self-pay | Admitting: Internal Medicine

## 2011-01-14 ENCOUNTER — Ambulatory Visit (INDEPENDENT_AMBULATORY_CARE_PROVIDER_SITE_OTHER): Payer: Medicaid Other | Admitting: Internal Medicine

## 2011-01-14 DIAGNOSIS — R0609 Other forms of dyspnea: Secondary | ICD-10-CM

## 2011-01-14 DIAGNOSIS — J90 Pleural effusion, not elsewhere classified: Secondary | ICD-10-CM

## 2011-01-14 DIAGNOSIS — R0989 Other specified symptoms and signs involving the circulatory and respiratory systems: Secondary | ICD-10-CM

## 2011-01-19 ENCOUNTER — Telehealth: Payer: Self-pay | Admitting: Internal Medicine

## 2011-01-21 NOTE — Assessment & Plan Note (Signed)
Summary: Pulmonary/ ext ov with walking sats = ok x 3 laps   Copy to:  Dr. Johna Sheriff Primary Provider/Referring Provider:  none  CC:  Dyspnea- the same.  History of Present Illness: Eduardo West quit smoking 2010 extremely difficult hx due to language barrier, even thru fm member serving as interpreter  June 06, 2010  1st pulmonary office eval for large L effusion and co fatigue and sob x months. eval by [facility] with abd ct showing large effusion.  minimal L chest discomfort, wt loss ? amt. no cough or leg swelling. no fever or sweats rec tap -   600 cc serous yellow exudate 94% L> mature lymphocytes, rec flow cytometry if recurs.  July 24, 2010 Followup on thoracentesis results.  Pt states still has some chest discomfort and tightness- worse with inspiration.  He denies SOB or cough.  Appetite is poor.  cxr no real change, rec return in 3 weeks   August 22, 2010 3 wk followup with cxr.  Pt c/o increased SOB- esp at in the early evening.  Pt states that he is SOB without any exertion at all assoc with centralized cp with coughing only. effusion did not worsen vs the pots tap previous xrary therefore rec Omeprazole 20 mg Take one 30-60 min before first and last meals of the day  GERD  diet  September 11, 2010  cc Dyspnea- the same, no better or worse. not losing any weight.  no cough now. afraid to eat but not loosing any wt.  rec Continue omeprazole 20mg   Take one 30-60 min before first and last meals of the day  October 09, 2010  ov cp all the time but worse with deep breath and sob, not eating.   rec conservative f/u  November 05, 2010 ov no change doe/ cp  on ppi.   December 08, 2010 --Presents for an acute office visit.Complains of  SOB especially at night, some heaviness in chest, occ dry cough. Comes and goes. Was seen by PCP last week and sent for xray which showed no change in left effusion. He cont to feel bad w/no energy.Cough persists w/ intermittent fevers, pain in left  rib area. Interpreter from [place]. rec IPPD  December 10, 2010 seen by Dr Delford Field PPD is positive.  Has chronic L effusion.  No family exposure.  Came to [place] 1992.  Was pos on PPD in the past took 9months INH, 1992 Now with chronic effusion.    Pt cont to have dry cough and weakness.  Pt has intermittent fever.  Pt cont to feel very bad. 2-3 months ago had fever and sweating.  >  health dept eval    December 22, 2010 ov no apparent change in any symptoms x tired from being on tb rx per Health dept.  no convincing NS, wt loss, no change on serial cxrs since his orginal tap. rec f/u per HD and return here when released  January 14, 2011 cc doe/ fatigue, not doing activity at all, requesting narcotics for chronic abd pain. Pt denies any significant sore throat, dysphagia, itching, sneezing,  nasal congestion or excess secretions,  fever, chills, sweats, unintended wt loss, pleuritic or exertional cp, hempoptysis, change in activity tolerance  orthopnea pnd or leg swelling Pt also denies any obvious fluctuation in symptoms with weather or environmental change or other alleviating or aggravating factors.       Current Medications (verified): 1)  Tylenol 325 Mg Tabs (Acetaminophen) .[name] KitchenMarland KitchenMarland Kitchen  Per Bottle Instructions As Needed 2)  Oxycodone-Acetaminophen 5-325 Mg Tabs (Oxycodone-Acetaminophen) .... Take 1 To 2 Tablet By Mouth Every 4 Hours As Needed 3)  Isoniazid 300 Mg Tabs (Isoniazid) .[name] Kitchen.. 1 Once Daily 4)  Pyrazinamide 500 Mg Tabs (Pyrazinamide) .... 3 Once Daily 5)  Vitamin B-6 25 Mg Tabs (Pyridoxine Hcl) .[name] Kitchen.. 1 Once Daily 6)  Myambutol 400 Mg Tabs (Ethambutol Hcl) .... 3 Once Daily 7)  Rifampin 300 Mg Caps (Rifampin) .... 2 Once Daily  Allergies (verified): No Known Drug Allergies  Past History:  Past Medical History: Diverticulosis     - s/p hemicolectomy and cholecystectomy for ? abscess 01/23/2010  L Pleural effusion developed post op     - Tcentesis June 06, 2010  600 cc serous yellow exudate 94%  L>  mature lymphocytes     - Small/ moderated residual effusion  July 24, 2010 >>   lat decub no sign layering 09/05/10     - ESR  55 > 40 > 29 October 09, 2010  Kidney stones      - bilateral, non obstructing by CT 06/05/10 Positive IPPD Dec 08 2010 ................................[name]     - Rx started Jan 2012   Vital Signs:  Patient profile:   50 year old [place] Weight:      128 pounds O2 Sat:      96 % on Room air Temp:     98.0 degrees F oral Pulse rate:   88 / minute BP sitting:   134 / 90  (left arm)  Vitals Entered By: [name] (January 14, 2011 11:29 AM)  O2 Flow:  Room air  Serial Vital Signs/Assessments:  Comments: 11:49 AM Ambulatory Pulse Oximetry  Resting; HR__81___    02 Sat__98%ra___  Lap1 (185 feet)   HR__96___   02 Sat__96%ra___ Lap2 (185 feet)   HR__98___   02 Sat__97%ra___    Lap3 (185 feet)   HR__93___   02 Sat__97%Ra___  _x__Test Completed without Difficulty ___Test Stopped due to:   By: [name]    Physical Exam  Additional Exam:  amb thin vietmamese West doesn't speak any English, depressed with hopeless helpless affect wt  125 July 24, 2010 > 123 August 22, 2010 > 124 September 11, 2010 > 125 October 09, 2010 > 126 November 05, 2010 >129 December 08, 2010 > 128 December 22, 2010 > 128 January 14, 2011  HEENT: nl dentition, turbinates, and orophanx. Nl external ear canals without cough reflex NECK :  without JVD/Nodes/TM/ nl carotid upstrokes bilaterally LUNGS: decreased bs / dullness at L base CV:  RRR  no s3 or murmur or increase in P2, no edema  ABD:  soft and nontender with nl excursion in the supine position. No bruits or organomegaly, bowel sounds nl MS:  warm without deformities, calf tenderness, cyanosis or clubbing SKIN: warm and dry without lesions       Impression & Recommendations:  Problem # 1:  PLEURAL EFFUSION (ICD-511.9)  This effusion only occurred in a post op setting    where developed L subphrenic abscess therefore not at all likley to be TB but has Pos IPPD so being treated by health dept with f/u here planned after completes rx    Orders: Est. Patient Level III (52841)  Problem # 2:  DYSPNEA (ICD-786.09)  3 laps 185 ft each  no problem with sob or desat,  appears to be suffering from decondtioning/depression and also ? drug seeking for narcotics for  chronic abd pain, nothing further to add to his care at this point.   Orders: Est. Patient Level III (16109) Pulse Oximetry, Ambulatory (60454)  Patient Instructions: 1)  Return here after the health department releases you unless your breathing worsens  2)  You need to walk daily at the pace you did todayx 30 min on flat surface  to rebuild strength and endurance 3)  No pain medications needed for your lung problems

## 2011-01-27 NOTE — Progress Notes (Signed)
Summary: health dept phoned med list corrected  Phone Note From Other Clinic   Caller: [name] Call For: [name] Summary of Call: [name] with the health dept phoned stated that they are also following Eduardo West and they received our office note from 2/15 and there the medicaiton list is not correct there are alot of meds listed that have been discontinued because they have ruled him out at an active TB patient. Please call [name] at [phone] or [phone] Initial call taken by: Vedia Coffer,  January 19, 2011 4:08 PM  Follow-up for Phone Call        [email or web address] for margaret at health dept [name] CMA  January 19, 2011 4:55 PM  [name] w/ health dept returned your call. call cell [phone] (1st) then desk [phone]. [name], CNA  January 21, 2011 1:36 PM  Additional Follow-up for Phone Call Additional follow up Details #1::        Spoke with [name] with the HD.  She wanted to let MW know that prior to his last ov here with [place], they stopped all TB meds except for the rifampin. Med list was updated. She states that they do not plan to followup with pt anymore.  Will forward to MW.  Additional Follow-up by: [name],  January 21, 2011 1:42 PM    Additional Follow-up for Phone Call Additional follow up Details #2::    ok, noted Follow-up by: Nyoka Cowden MD,  January 21, 2011 4:19 PM

## 2011-02-18 LAB — URINALYSIS, ROUTINE W REFLEX MICROSCOPIC
Bilirubin Urine: NEGATIVE
Glucose, UA: NEGATIVE mg/dL
Hgb urine dipstick: NEGATIVE
Ketones, ur: NEGATIVE mg/dL
Nitrite: NEGATIVE
Protein, ur: NEGATIVE mg/dL
Specific Gravity, Urine: 1.011 (ref 1.005–1.030)
Urobilinogen, UA: 0.2 mg/dL (ref 0.0–1.0)
pH: 7 (ref 5.0–8.0)

## 2011-02-18 LAB — CBC
HCT: 37.7 % — ABNORMAL LOW (ref 39.0–52.0)
Hemoglobin: 12.6 g/dL — ABNORMAL LOW (ref 13.0–17.0)
MCHC: 33.5 g/dL (ref 30.0–36.0)
MCV: 97.3 fL (ref 78.0–100.0)
Platelets: 243 10*3/uL (ref 150–400)
RBC: 3.88 MIL/uL — ABNORMAL LOW (ref 4.22–5.81)
RDW: 12.7 % (ref 11.5–15.5)
WBC: 14.4 10*3/uL — ABNORMAL HIGH (ref 4.0–10.5)

## 2011-02-18 LAB — DIFFERENTIAL
Basophils Absolute: 0 10*3/uL (ref 0.0–0.1)
Basophils Relative: 0 % (ref 0–1)
Eosinophils Absolute: 0.3 10*3/uL (ref 0.0–0.7)
Eosinophils Relative: 2 % (ref 0–5)
Lymphocytes Relative: 7 % — ABNORMAL LOW (ref 12–46)
Lymphs Abs: 1 10*3/uL (ref 0.7–4.0)
Monocytes Absolute: 0.3 10*3/uL (ref 0.1–1.0)
Monocytes Relative: 2 % — ABNORMAL LOW (ref 3–12)
Neutro Abs: 12.8 10*3/uL — ABNORMAL HIGH (ref 1.7–7.7)
Neutrophils Relative %: 89 % — ABNORMAL HIGH (ref 43–77)

## 2011-02-18 LAB — COMPREHENSIVE METABOLIC PANEL
ALT: 14 U/L (ref 0–53)
AST: 18 U/L (ref 0–37)
Albumin: 3.9 g/dL (ref 3.5–5.2)
Alkaline Phosphatase: 59 U/L (ref 39–117)
BUN: 12 mg/dL (ref 6–23)
CO2: 27 mEq/L (ref 19–32)
Calcium: 9.4 mg/dL (ref 8.4–10.5)
Chloride: 107 mEq/L (ref 96–112)
Creatinine, Ser: 0.75 mg/dL (ref 0.4–1.5)
GFR calc Af Amer: >60 mL/min (ref >60)
GFR calc non Af Amer: >60 mL/min (ref >60)
Glucose, Bld: 105 mg/dL — ABNORMAL HIGH (ref 70–99)
Potassium: 3.2 mEq/L — ABNORMAL LOW (ref 3.5–5.1)
Sodium: 141 mEq/L (ref 135–145)
Total Bilirubin: 1.3 mg/dL — ABNORMAL HIGH (ref 0.3–1.2)
Total Protein: 6.6 g/dL (ref 6.0–8.3)

## 2011-02-18 LAB — LIPASE, BLOOD: Lipase: 49 U/L (ref 11–59)

## 2011-02-19 LAB — CBC
HCT: 33.9 % — ABNORMAL LOW (ref 39.0–52.0)
HCT: 34.3 % — ABNORMAL LOW (ref 39.0–52.0)
HCT: 35.4 % — ABNORMAL LOW (ref 39.0–52.0)
HCT: 36.4 % — ABNORMAL LOW (ref 39.0–52.0)
Hemoglobin: 11.3 g/dL — ABNORMAL LOW (ref 13.0–17.0)
Hemoglobin: 11.6 g/dL — ABNORMAL LOW (ref 13.0–17.0)
Hemoglobin: 11.7 g/dL — ABNORMAL LOW (ref 13.0–17.0)
Hemoglobin: 12 g/dL — ABNORMAL LOW (ref 13.0–17.0)
Hemoglobin: 12.1 g/dL — ABNORMAL LOW (ref 13.0–17.0)
Hemoglobin: 12.1 g/dL — ABNORMAL LOW (ref 13.0–17.0)
Hemoglobin: 12.2 g/dL — ABNORMAL LOW (ref 13.0–17.0)
Hemoglobin: 12.3 g/dL — ABNORMAL LOW (ref 13.0–17.0)
MCHC: 33.7 g/dL (ref 30.0–36.0)
MCHC: 34 g/dL (ref 30.0–36.0)
MCHC: 34 g/dL (ref 30.0–36.0)
MCHC: 34.1 g/dL (ref 30.0–36.0)
MCHC: 34.2 g/dL (ref 30.0–36.0)
MCHC: 34.3 g/dL (ref 30.0–36.0)
MCHC: 34.4 g/dL (ref 30.0–36.0)
MCV: 94.9 fL (ref 78.0–100.0)
MCV: 95.8 fL (ref 78.0–100.0)
MCV: 96 fL (ref 78.0–100.0)
MCV: 96 fL (ref 78.0–100.0)
MCV: 96.1 fL (ref 78.0–100.0)
Platelets: 216 10*3/uL (ref 150–400)
Platelets: 218 10*3/uL (ref 150–400)
Platelets: 331 10*3/uL (ref 150–400)
Platelets: 530 10*3/uL — ABNORMAL HIGH (ref 150–400)
RBC: 3.45 MIL/uL — ABNORMAL LOW (ref 4.22–5.81)
RBC: 3.52 MIL/uL — ABNORMAL LOW (ref 4.22–5.81)
RBC: 3.57 MIL/uL — ABNORMAL LOW (ref 4.22–5.81)
RBC: 3.59 MIL/uL — ABNORMAL LOW (ref 4.22–5.81)
RBC: 3.68 MIL/uL — ABNORMAL LOW (ref 4.22–5.81)
RBC: 3.69 MIL/uL — ABNORMAL LOW (ref 4.22–5.81)
RBC: 3.71 MIL/uL — ABNORMAL LOW (ref 4.22–5.81)
RDW: 11.8 % (ref 11.5–15.5)
RDW: 12.1 % (ref 11.5–15.5)
RDW: 12.2 % (ref 11.5–15.5)
RDW: 12.3 % (ref 11.5–15.5)
RDW: 12.4 % (ref 11.5–15.5)
RDW: 12.4 % (ref 11.5–15.5)
RDW: 12.4 % (ref 11.5–15.5)
WBC: 12.2 10*3/uL — ABNORMAL HIGH (ref 4.0–10.5)
WBC: 14 10*3/uL — ABNORMAL HIGH (ref 4.0–10.5)
WBC: 14.8 10*3/uL — ABNORMAL HIGH (ref 4.0–10.5)
WBC: 15.3 10*3/uL — ABNORMAL HIGH (ref 4.0–10.5)

## 2011-02-19 LAB — BASIC METABOLIC PANEL
BUN: 2 mg/dL — ABNORMAL LOW (ref 6–23)
CO2: 27 mEq/L (ref 19–32)
CO2: 29 mEq/L (ref 19–32)
CO2: 30 mEq/L (ref 19–32)
CO2: 33 mEq/L — ABNORMAL HIGH (ref 19–32)
Calcium: 8.5 mg/dL (ref 8.4–10.5)
Calcium: 8.7 mg/dL (ref 8.4–10.5)
Calcium: 8.9 mg/dL (ref 8.4–10.5)
Calcium: 9 mg/dL (ref 8.4–10.5)
Chloride: 96 mEq/L (ref 96–112)
Chloride: 98 mEq/L (ref 96–112)
Creatinine, Ser: 0.77 mg/dL (ref 0.4–1.5)
GFR calc Af Amer: >60 mL/min (ref >60)
GFR calc Af Amer: >60 mL/min (ref >60)
GFR calc non Af Amer: >60 mL/min (ref >60)
GFR calc non Af Amer: >60 mL/min (ref >60)
Glucose, Bld: 122 mg/dL — ABNORMAL HIGH (ref 70–99)
Glucose, Bld: 123 mg/dL — ABNORMAL HIGH (ref 70–99)
Glucose, Bld: 128 mg/dL — ABNORMAL HIGH (ref 70–99)
Potassium: 3.5 mEq/L (ref 3.5–5.1)
Potassium: 3.7 mEq/L (ref 3.5–5.1)
Sodium: 131 mEq/L — ABNORMAL LOW (ref 135–145)
Sodium: 132 mEq/L — ABNORMAL LOW (ref 135–145)
Sodium: 135 mEq/L (ref 135–145)
Sodium: 136 mEq/L (ref 135–145)

## 2011-02-19 LAB — DIFFERENTIAL
Basophils Absolute: 0 10*3/uL (ref 0.0–0.1)
Basophils Relative: 0 % (ref 0–1)
Eosinophils Absolute: 0.3 10*3/uL (ref 0.0–0.7)
Eosinophils Relative: 3 % (ref 0–5)
Lymphocytes Relative: 12 % (ref 12–46)
Lymphs Abs: 1.5 10*3/uL (ref 0.7–4.0)
Monocytes Absolute: 0.7 10*3/uL (ref 0.1–1.0)
Monocytes Relative: 6 % (ref 3–12)
Neutro Abs: 9.6 10*3/uL — ABNORMAL HIGH (ref 1.7–7.7)
Neutrophils Relative %: 79 % — ABNORMAL HIGH (ref 43–77)

## 2011-02-19 LAB — URINALYSIS, ROUTINE W REFLEX MICROSCOPIC
Bilirubin Urine: NEGATIVE
Glucose, UA: NEGATIVE mg/dL
Hgb urine dipstick: NEGATIVE
Ketones, ur: NEGATIVE mg/dL
Nitrite: NEGATIVE
Protein, ur: NEGATIVE mg/dL
Specific Gravity, Urine: 1.013 (ref 1.005–1.030)
Urobilinogen, UA: 0.2 mg/dL (ref 0.0–1.0)
pH: 7.5 (ref 5.0–8.0)

## 2011-02-19 LAB — COMPREHENSIVE METABOLIC PANEL
ALT: 12 U/L (ref 0–53)
AST: 20 U/L (ref 0–37)
Albumin: 3.4 g/dL — ABNORMAL LOW (ref 3.5–5.2)
Alkaline Phosphatase: 69 U/L (ref 39–117)
BUN: 13 mg/dL (ref 6–23)
CO2: 27 mEq/L (ref 19–32)
Calcium: 7.8 mg/dL — ABNORMAL LOW (ref 8.4–10.5)
Chloride: 100 mEq/L (ref 96–112)
Creatinine, Ser: 1.02 mg/dL (ref 0.4–1.5)
GFR calc Af Amer: >60 mL/min (ref >60)
GFR calc non Af Amer: >60 mL/min (ref >60)
Glucose, Bld: 91 mg/dL (ref 70–99)
Potassium: 3.6 mEq/L (ref 3.5–5.1)
Sodium: 132 mEq/L — ABNORMAL LOW (ref 135–145)
Total Bilirubin: 1.3 mg/dL — ABNORMAL HIGH (ref 0.3–1.2)
Total Protein: 6.3 g/dL (ref 6.0–8.3)

## 2011-02-19 LAB — URINE CULTURE
Colony Count: NO GROWTH
Culture: NO GROWTH

## 2011-04-17 NOTE — Discharge Summary (Signed)
[facility]  Patient:    Eduardo West, Eduardo West Visit Number: [id number] MRN: [id number]          Service Type: MED Location: [phone] Attending Physician:  Caleen Essex Dictated by:   Ollen Gross. Vernell Morgans, M.D. Admit Date:  05/08/2002 Discharge Date: 05/12/2002                             Discharge Summary  HISTORY OF PRESENT ILLNESS:  The patient is a 50 year old, [place] gentleman who presented with right lower quadrant pain and elevated white count.  He was found to have appendicitis and taken to the operating room for an attempted laparoscopic and subsequently open appendectomy.  HOSPITAL COURSE:  Because of a large abscess cavity and some evidence for perforation of the appendix, his postoperative course included a slow increase in his ability to tolerate his diet.  He was treated with antibiotics IV because of the infection in his abdomen and he tolerated all this well.  By June 13, he was tolerating a regular diet.  His wound was healing well with dressings and he was ready for discharge home.  DISCHARGE MEDICATIONS: 1. Resume home medications. 2. Prescription for Vicodin for pain.  DIET:  As tolerated.  ACTIVITY:  No heavy lifting.  WOUND CARE:  He is to shower and keep his wound clean.  FOLLOWUP:  Follow up with Dr. Carolynne Edouard in one week in the clinic.  DISCHARGE DIAGNOSIS:  Appendicitis.  CONDITION ON DISCHARGE:  Stable and ready for discharge home. Dictated by: Ollen Gross. Vernell Morgans, M.D. Attending Physician:  Caleen Essex DD:  06/19/02 TD:  06/25/02 Job: [id number]

## 2011-04-17 NOTE — Op Note (Signed)
[facility]  Patient:    Eduardo West, REVOLORIO Visit Number: [id number] MRN: [id number]          Service Type: MED Location: [id number] [id number] Attending Physician:  Caleen Essex Dictated by:   Ollen Gross. Vernell Morgans, M.D. Proc. Date: 05/08/02 Admit Date:  05/08/2002 Discharge Date: 05/12/2002                             Operative Report  PREOPERATIVE DIAGNOSIS:  Appendicitis.  POSTOPERATIVE DIAGNOSIS:  Appendicitis.  PROCEDURE:  Attempted laparoscopic and subsequent open appendectomy.  SURGEON:  Ollen Gross. Vernell Morgans, M.D.  ANESTHESIA:  General endotracheal.  DESCRIPTION OF PROCEDURE:  After informed consent was obtained, the patient was brought to the operating room and placed in a supine position on the operating room table.  After adequate induction of general anesthesia, the patients abdomen was prepped with Betadine, and draped in the usual sterile manner.  A small infraumbilical vertical-oriented incision was made with a 15 blade knife.  This incision was carried down through the subcutaneous tissues using blunt dissection with the Kelly clamp and [employer] retractors. The linea alba was also incised with the 15 blade knife and each side was grasped with Kocher clamps and elevated anteriorly.  The preperitoneal space was entered bluntly with a hemostat until the peritoneum was opened and access was gained to the abdominal cavity.  A 0 Vicryl pursestring stitch was placed in the fascia surrounding this opening.  A Hasson cannula was placed through this opening and anchored in place with the previously placed Vicryl pursestring stitch.  The abdomen was then insufflated with carbon dioxide without difficulty.  The laparoscope was placed through the Hasson cannula and the right lower quadrant was inspected.  There was an obvious inflamed appendix that could be visualized.  Next, a 5 mm port was placed just below the Hasson cannula, and just to the left of the midline  by infiltrating this area with 0.25% Marcaine, making a small stab incision with the 15 blade knife, and then placing the 5 mm port bluntly through this incision into the abdominal cavity under direct vision. Near the suprapubic area, another site was chosen for placement of a 12 mm port, and this area was infiltrated with 0.25% Marcaine.  A small transverse incision was made and the 12 mm port was placed bluntly through this incision into the abdominal cavity under direct vision.  Blunt graspers were then used to manipulate the appendix.  There was obvious pus coming from the base of the appendix, and there was a question about whether the appendix was ruptured.  At this point, a site was chosen for placement of a right lower quadrant transverse incision, and this incision was made with the 15 blade knife.  This incision was carried down through the skin and subcutaneous tissues using the Bovie electrocautery.  The external oblique fascia was then also opened with the electrocautery and the muscles were also divided with the electrocautery. The peritoneum was identified and grasped between hemostats and opened with the Metzenbaum scissors.  The rest of the incision was then opened under direct vision.  The right lower quadrant was then explored.  The appendix and cecum were mobile and were able to be brought up into the field.  Initially, the mesentery near the appendix appeared to be somewhat necrotic.  On further dissection, the base of the appendix appeared to be intact.  The base of  the appendix was then stapled across with the GIA-55 stapler.  The abdomen was then irrigated with copious amounts of saline.  THe incision was then closed with two layers of running #1 PDS.  The subcutaneous tissue was left open except for a single staple to bring the edges of the wound together and the wounds were all packed with gauze.  The infraumbilical incision was closed with the previously placed  Vicryl pursestring stitch.  Sterile dressings were applied.  The patient tolerated the procedure well.  At the end of the case, all needle, sponge, and instrument counts were correct.  The patient was then awakened and taken to the recovery room in stable condition. Dictated by:   Ollen Gross. Vernell Morgans, M.D. Attending Physician:  Caleen Essex DD:  05/15/02 TD:  05/17/02 Job: [id number]

## 2011-04-23 ENCOUNTER — Encounter: Payer: Self-pay | Admitting: Internal Medicine

## 2011-05-06 ENCOUNTER — Encounter: Payer: Self-pay | Admitting: Internal Medicine

## 2011-05-06 ENCOUNTER — Ambulatory Visit (INDEPENDENT_AMBULATORY_CARE_PROVIDER_SITE_OTHER): Payer: Medicaid Other | Admitting: Internal Medicine

## 2011-05-06 ENCOUNTER — Ambulatory Visit (INDEPENDENT_AMBULATORY_CARE_PROVIDER_SITE_OTHER)
Admission: RE | Admit: 2011-05-06 | Discharge: 2011-05-06 | Disposition: A | Discharge status: Home / Self Care | Payer: Medicaid Other | Source: Ambulatory Visit | Attending: Internal Medicine | Admitting: Internal Medicine

## 2011-05-06 ENCOUNTER — Other Ambulatory Visit (INDEPENDENT_AMBULATORY_CARE_PROVIDER_SITE_OTHER): Payer: Medicaid Other

## 2011-05-06 VITALS — BP 140/96 | HR 71 | Temp 97.7°F | Wt 136.8 lb

## 2011-05-06 DIAGNOSIS — J90 Pleural effusion, not elsewhere classified: Secondary | ICD-10-CM

## 2011-05-06 LAB — BASIC METABOLIC PANEL
Calcium: 9.2 mg/dL (ref 8.4–10.5)
GFR: 99.49 mL/min (ref >60.00)
Potassium: 3.5 mEq/L (ref 3.5–5.1)
Sodium: 140 mEq/L (ref 135–145)

## 2011-05-06 LAB — CBC WITH DIFFERENTIAL/PLATELET
Basophils Absolute: 0 10*3/uL (ref 0.0–0.1)
Basophils Relative: 0.3 % (ref 0.0–3.0)
Eosinophils Absolute: 0.3 10*3/uL (ref 0.0–0.7)
Eosinophils Relative: 5 % (ref 0.0–5.0)
HCT: 41.7 % (ref 39.0–52.0)
Hemoglobin: 13.9 g/dL (ref 13.0–17.0)
Lymphocytes Relative: 26.4 % (ref 12.0–46.0)
Lymphs Abs: 1.5 10*3/uL (ref 0.7–4.0)
MCHC: 33.4 g/dL (ref 30.0–36.0)
MCV: 96.6 fl (ref 78.0–100.0)
Monocytes Absolute: 0.3 10*3/uL (ref 0.1–1.0)
Monocytes Relative: 5.8 % (ref 3.0–12.0)
Neutro Abs: 3.6 10*3/uL (ref 1.4–7.7)
Neutrophils Relative %: 62.5 % (ref 43.0–77.0)
Platelets: 253 10*3/uL (ref 150.0–400.0)
RBC: 4.32 Mil/uL (ref 4.22–5.81)
RDW: 12.5 % (ref 11.5–14.6)
WBC: 5.8 10*3/uL (ref 4.5–10.5)

## 2011-05-06 LAB — SEDIMENTATION RATE: Sed Rate: 6 mm/hr (ref 0–22)

## 2011-05-06 NOTE — Progress Notes (Signed)
Subjective:     Patient ID: Eduardo West, male   DOB: May 30, 1961, 50 y.o.   MRN: [id number]  HPI 50 yo vietnamese male quit smoking 2010 extremely difficult hx due to language barrier, even thru fm member serving as interpreter   June 06, 2010 1st pulmonary office eval for large L effusion and co fatigue and sob x months. eval by [facility] with abd ct showing large effusion. minimal L chest discomfort, wt loss ? amt. no cough or leg swelling. no fever or sweats rec tap - 600 cc serous yellow exudate 94% L> mature lymphocytes, rec flow cytometry if recurs.   July 24, 2010 Followup on thoracentesis results. Pt states still has some chest discomfort and tightness- worse with inspiration. He denies SOB or cough. Appetite is poor. cxr no real change, rec return in 3 weeks   August 22, 2010 3 wk followup with cxr. Pt c/o increased SOB- esp at in the early evening. Pt states that he is SOB without any exertion at all assoc with centralized cp with coughing only. effusion did not worsen vs the pots tap previous xrary therefore rec Omeprazole 20 mg Take one 30-60 min before first and last meals of the day  GERD diet   September 11, 2010 cc Dyspnea- the same, no better or worse. not losing any weight. no cough now. afraid to eat but not loosing any wt. rec Continue omeprazole 20mg  Take one 30-60 min before first and last meals of the day   October 09, 2010 ov cp all the time but worse with deep breath and sob, not eating. rec conservative f/u   November 05, 2010 ov no change doe/ cp on ppi. December 08, 2010 --Presents for an acute office visit.Complains of SOB especially at night, some heaviness in chest, occ dry cough. Comes and goes. Was seen by PCP last week and sent for xray which showed no change in left effusion. He cont to feel bad w/no energy.Cough persists w/ intermittent fevers, pain in left rib area. Interpreter from [place]. rec IPPD   December 10, 2010 seen by Dr Delford Field  PPD   positive. Has chronic  L effusion. No family exposure. Came to [place] 1992. Was pos on PPD in the past took 9months INH, 1992  Now with chronic effusion. Pt cont to have dry cough and weakness. Pt has intermittent fever. Pt cont to feel very bad.  2-3 months ago had fever and sweating. > health dept eval   December 22, 2010 ov no apparent change in any symptoms x tired from being on tb rx per Health dept. no convincing NS, wt loss, no change on serial cxrs since his orginal tap. rec f/u per HD and return here when released    05/06/2011 ov/Jeremiah Curci discharged by HD and  no change at all in chronic chest and abd pain and doe. No cough. Good appetite.  Pt denies any significant sore throat, dysphagia, itching, sneezing,  nasal congestion or excess/ purulent secretions,  fever, chills, sweats, unintended wt loss, pleuritic or exertional cp, hempoptysis, orthopnea pnd or leg swelling.    Also denies any obvious fluctuation of symptoms with weather or environmental changes or other aggravating or alleviating factors.   Past Medical History:  Diverticulosis  - s/p hemicolectomy and cholecystectomy for ? abscess 01/23/2010  L Pleural effusion developed post op 12/2009 p hemicolectomy - Tcentesis June 06, 2010 600 cc serous yellow exudate 94% L> mature lymphocytes  - Small/ moderated residual effusion July 24, 2010 >>  lat decub no sign layering 09/05/10 > improved 05/06/2011  - ESR 55 > 40 > 29 October 09, 2010  Kidney stones  - bilateral, non obstructing by CT 06/05/10  [facility] Dec 08 2010 [name]  - Rx started Jan 2012 > stopped by health dep 03/2011     Review of Systems     Objective:   Physical Exam amb thin vietmamese male doesn't speak any English, depressed with hopeless/ helpless affect / sits slouched over against wall wt 125 July 24, 2010 >  > 125 October 09, 2010 >   128 December 22, 2010 > 136 05/06/2011   HEENT: nl dentition, turbinates, and orophanx. Nl  external ear canals without cough reflex  NECK : without JVD/Nodes/TM/ nl carotid upstrokes bilaterally  LUNGS: decreased bs / dullness at L base  CV: RRR no s3 or murmur or increase in P2, no edema  ABD: soft and nontender with nl excursion in the supine position. No bruits or organomegaly, bowel sounds nl  MS: warm without deformities, calf tenderness, cyanosis or clubbing  SKIN: warm and dry without lesions     Assessment:          Plan:

## 2011-05-06 NOTE — Assessment & Plan Note (Signed)
Still feel this was a sympathetic effusion post op and strongly doubt it has anything with his chronic pain or sob.  Discussed in detail all the  indications, usual  risks and alternatives  relative to the benefits with patient who agrees to proceed with conservative f/u since improving.

## 2011-05-06 NOTE — Progress Notes (Deleted)
ccc

## 2011-05-06 NOTE — Patient Instructions (Signed)
You need a family doctor   Please schedule a follow up visit in 3 months but call sooner if needed with cxr on return -  If you have worse breathing or worse pain breathing in we need to soon you sooner

## 2011-05-07 ENCOUNTER — Other Ambulatory Visit: Payer: Self-pay | Admitting: Specialist

## 2011-05-07 ENCOUNTER — Ambulatory Visit
Admission: RE | Admit: 2011-05-07 | Discharge: 2011-05-07 | Disposition: A | Discharge status: Home / Self Care | Payer: Medicaid Other | Source: Ambulatory Visit | Attending: Specialist | Admitting: Specialist

## 2011-05-07 DIAGNOSIS — J90 Pleural effusion, not elsewhere classified: Secondary | ICD-10-CM

## 2011-05-13 NOTE — Progress Notes (Signed)
Quick Note:  Spoke with pt's friend and interpreter [place] and notified of results and he verbalized understanding and states will inform the pt. ______

## 2011-05-13 NOTE — Progress Notes (Signed)
Quick Note:  Spoke with [place], pt's friend and interpreter and notified of results per Dr Sherene Sires. He verbalized understanding and states will inform the pt. ______

## 2011-05-22 ENCOUNTER — Other Ambulatory Visit (INDEPENDENT_AMBULATORY_CARE_PROVIDER_SITE_OTHER): Payer: Self-pay | Admitting: General Surgery

## 2011-05-22 DIAGNOSIS — R1013 Epigastric pain: Secondary | ICD-10-CM

## 2011-05-22 DIAGNOSIS — R197 Diarrhea, unspecified: Secondary | ICD-10-CM

## 2011-05-25 ENCOUNTER — Ambulatory Visit
Admission: RE | Admit: 2011-05-25 | Discharge: 2011-05-25 | Disposition: A | Discharge status: Home / Self Care | Payer: Medicaid Other | Source: Ambulatory Visit | Attending: General Surgery | Admitting: General Surgery

## 2011-05-25 DIAGNOSIS — R1013 Epigastric pain: Secondary | ICD-10-CM

## 2011-05-25 DIAGNOSIS — R197 Diarrhea, unspecified: Secondary | ICD-10-CM

## 2011-05-25 MED ORDER — IOHEXOL 300 MG/ML  SOLN
100.0000 mL | Freq: Once | INTRAMUSCULAR | Status: AC | PRN
  Administered 2011-05-25: 100 mL via INTRAVENOUS

## 2011-08-10 ENCOUNTER — Ambulatory Visit (INDEPENDENT_AMBULATORY_CARE_PROVIDER_SITE_OTHER): Payer: Medicaid Other | Admitting: Internal Medicine

## 2011-08-10 ENCOUNTER — Encounter: Payer: Self-pay | Admitting: Internal Medicine

## 2011-08-10 ENCOUNTER — Ambulatory Visit (INDEPENDENT_AMBULATORY_CARE_PROVIDER_SITE_OTHER)
Admission: RE | Admit: 2011-08-10 | Discharge: 2011-08-10 | Disposition: A | Discharge status: Home / Self Care | Payer: Medicaid Other | Source: Ambulatory Visit | Attending: Internal Medicine | Admitting: Internal Medicine

## 2011-08-10 VITALS — BP 138/84 | HR 84 | Temp 97.6°F | Ht 66.0 in | Wt 143.0 lb

## 2011-08-10 DIAGNOSIS — R7611 Nonspecific reaction to tuberculin skin test without active tuberculosis: Secondary | ICD-10-CM

## 2011-08-10 DIAGNOSIS — J90 Pleural effusion, not elsewhere classified: Secondary | ICD-10-CM

## 2011-08-10 DIAGNOSIS — R0989 Other specified symptoms and signs involving the circulatory and respiratory systems: Secondary | ICD-10-CM

## 2011-08-10 DIAGNOSIS — R0609 Other forms of dyspnea: Secondary | ICD-10-CM

## 2011-08-10 NOTE — Assessment & Plan Note (Signed)
No convincing evidence of progression or need to retap or VATS this small effusion that is likely sympathetic from Surgery 12/2009  Will do one final f/u in 3 months then turn him over to his primary doctor if he'll bring me the name of his new doctor on return

## 2011-08-10 NOTE — Patient Instructions (Addendum)
Please schedule a follow up visit in 3 months but call sooner if needed with cxr - when you return bring the name of your primary doctor with you.  In the meantime,   Advil or aleve can be used as needed with meals for leg pain

## 2011-08-10 NOTE — Progress Notes (Signed)
Subjective:     Patient ID: Eduardo West, male   DOB: 12-May-1961, 50 y.o.   MRN: [id number]  HPI  Primary =  [facility] [address]  50 yo vietnamese male quit smoking 2010 extremely difficult hx due to language barrier, even thru fm member serving as interpreter   June 06, 2010 1st pulmonary office eval for large L effusion and co fatigue and sob x months. eval by [facility] with abd ct showing large effusion. minimal L chest discomfort, wt loss ? amt. no cough or leg swelling. no fever or sweats rec tap - 600 cc serous yellow exudate 94% L> mature lymphocytes, rec flow cytometry if recurs.   July 24, 2010 Followup on thoracentesis results. Pt states still has some chest discomfort and tightness- worse with inspiration. He denies SOB or cough. Appetite is poor. cxr no real change, rec return in 3 weeks   August 22, 2010 3 wk followup with cxr. Pt c/o increased SOB- esp at in the early evening. Pt states that he is SOB without any exertion at all assoc with centralized cp with coughing only. effusion did not worsen vs the pots tap previous xrary therefore rec Omeprazole 20 mg Take one 30-60 min before first and last meals of the day  GERD diet   September 11, 2010 cc Dyspnea- the same, no better or worse. not losing any weight. no cough now. afraid to eat but not loosing any wt. rec Continue omeprazole 20mg  Take one 30-60 min before first and last meals of the day   October 09, 2010 ov cp all the time but worse with deep breath and sob, not eating. rec conservative f/u   November 05, 2010 ov no change doe/ cp on ppi. December 08, 2010 --Presents for an acute office visit.Complains of SOB especially at night, some heaviness in chest, occ dry cough. Comes and goes. Was seen by PCP last week and sent for xray which showed no change in left effusion. He cont to feel bad w/no energy.Cough persists w/ intermittent fevers, pain in left rib area. Interpreter from [place]. rec IPPD    December 10, 2010 seen by Dr Delford Field  PPD   positive. Has chronic L effusion. No family exposure. Came to [place] 1992. Was pos on PPD in the past took 9months INH, 1992  Now with chronic effusion. Pt cont to have dry cough and weakness. Pt has intermittent fever. Pt cont to feel very bad.  2-3 months ago had fever and sweating. > health dept eval   December 22, 2010 ov no apparent change in any symptoms x tired from being on tb rx per Health dept. no convincing NS, wt loss, no change on serial cxrs since his orginal tap. rec f/u per HD and return here when released    05/06/2011 ov/Makira Holleman discharged by Health dept and  no change at all in chronic chest and abd pain and doe.  rec no change rx, f/u q 3 months until gets primary doctor to follow    08/10/2011 f/u ov/Vandy Tsuchiya has appt with Clinic in [address] one week cc fatigue and pain/ numbness x one month R leg - has not tried otcs, no fever, no change L cp/ doe x across the parking lot no change  No cough. Good appetite.   ROS At present neg for  any significant sore throat, dysphagia, itching, sneezing,  nasal congestion or excess/ purulent secretions,  fever, chills, sweats, unintended wt loss, classically pleuritic or  exertional cp, hempoptysis, orthopnea pnd or leg swelling.    Also denies any obvious fluctuation of symptoms with weather or environmental changes or other aggravating or alleviating factors.      Past Medical History:  Diverticulosis  - s/p hemicolectomy and cholecystectomy for ? abscess 01/23/2010  L Pleural effusion developed post op 12/2009 p hemicolectomy - Tcentesis June 06, 2010 600 cc serous yellow exudate 94% L> mature lymphocytes  - Small/ moderated residual effusion July 24, 2010 >> lat decub no sign layering 09/05/10 > improved 05/06/2011  - ESR 55 > 40 > 29 October 09, 2010  Kidney stones  - bilateral, non obstructing by CT 06/05/10  Positive [facility]D Dec 08 2010 [name]  - Rx started Jan 2012 > stopped by health dep 03/2011     Review of Systems     Objective:   Physical Exam amb thin vietmamese male doesn't speak any English, depressed with hopeless/ helpless affect / sits slouched over against wall wt 125 July 24, 2010 >  > 125 October 09, 2010 >   128 December 22, 2010 > 136 05/06/2011  > 143 08/10/2011  HEENT: nl dentition, turbinates, and orophanx. Nl external ear canals without cough reflex  NECK : without JVD/Nodes/TM/ nl carotid upstrokes bilaterally  LUNGS: decreased bs / dullness at L base  CV: RRR no s3 or murmur or increase in P2, no edema  ABD: soft and nontender with nl excursion in the supine position. No bruits or organomegaly, bowel sounds nl  MS: warm without deformities, calf tenderness, cyanosis or clubbing  SKIN: warm and dry without lesions     cxr 08/10/2011 IMPRESSION: Unchanged residual loculated left pleural effusion and associated left basilar pulmonary opacity. No new findings identified.    Assessment:          Plan:

## 2011-08-10 NOTE — Assessment & Plan Note (Signed)
Not able to reproduce this problem in the office - strongly suspect deconditioning and doubt related to effusion

## 2011-08-11 ENCOUNTER — Encounter: Payer: Self-pay | Admitting: Internal Medicine

## 2011-08-13 ENCOUNTER — Ambulatory Visit
Admission: RE | Admit: 2011-08-13 | Discharge: 2011-08-13 | Disposition: A | Discharge status: Home / Self Care | Payer: Medicaid Other | Source: Ambulatory Visit | Attending: *Deleted | Admitting: *Deleted

## 2011-08-13 ENCOUNTER — Other Ambulatory Visit: Payer: Self-pay | Admitting: *Deleted

## 2011-08-13 ENCOUNTER — Telehealth: Payer: Self-pay | Admitting: Internal Medicine

## 2011-08-13 DIAGNOSIS — M545 Low back pain, unspecified: Secondary | ICD-10-CM

## 2011-08-13 NOTE — Telephone Encounter (Signed)
Will forward message to MW so he can call Dr. Vilinda Blanks.

## 2011-08-17 ENCOUNTER — Telehealth (INDEPENDENT_AMBULATORY_CARE_PROVIDER_SITE_OTHER): Payer: Self-pay | Admitting: General Surgery

## 2011-08-17 NOTE — Telephone Encounter (Signed)
We rec'd refill request for Questran. Dr Johna Sheriff out of town so brought to Dr Dwain Sarna. Rx denied. Last office note states patient stopped this medication. Denial faxed back to pharmacy. If patient wants to restart medication, needs to wait for Dr Johna Sheriff per Dr Dwain Sarna.

## 2011-08-17 NOTE — Telephone Encounter (Signed)
08/10/11 ov note was faxed to Dr Lerry Liner at the number provided.

## 2011-08-17 NOTE — Telephone Encounter (Signed)
Fax numb [phone]  Lerry Liner needs copy of last ov

## 2011-09-04 ENCOUNTER — Encounter (INDEPENDENT_AMBULATORY_CARE_PROVIDER_SITE_OTHER): Payer: Self-pay | Admitting: General Surgery

## 2011-09-04 ENCOUNTER — Ambulatory Visit (INDEPENDENT_AMBULATORY_CARE_PROVIDER_SITE_OTHER): Payer: Medicaid Other | Admitting: General Surgery

## 2011-09-04 VITALS — BP 142/108 | HR 60 | Temp 98.2°F | Resp 20 | Ht 63.0 in | Wt 144.2 lb

## 2011-09-04 DIAGNOSIS — Z9889 Other specified postprocedural states: Secondary | ICD-10-CM

## 2011-09-04 MED ORDER — CHOLESTYRAMINE POWD: Status: DC

## 2011-09-04 NOTE — Patient Instructions (Signed)
Call for questions or if you need a refill.

## 2011-09-04 NOTE — Progress Notes (Signed)
Patient returns to the office with a remote history of ileocolectomy for perforated cecal diverticulitis. He has chronic abdominal pain which has been extensively worked up in the past as well as chronic diarrhea. We have put him on Questran which he states helped his diarrhea a lot and doesn't improve his abdominal discomfort as well. He wanted a refill and was told he needed to come to the office for recheck first. Generally he has no new symptoms. He also has chronic back pain and numbness in his leg which he has had the past. His primary physician is following and treating this.  Examination abdomen today shows a healed midline incision without hernias. His abdomen is soft and nontender.  Assessment and plan: Chronic diarrhea following ileocolectomy. The Questran helped significantly we will refill this as needed. A return here as needed.

## 2011-09-04 NOTE — Progress Notes (Signed)
Addended by: Maryan Puls on: 09/04/2011 11:33 AM   Modules accepted: Orders

## 2011-11-12 ENCOUNTER — Ambulatory Visit (INDEPENDENT_AMBULATORY_CARE_PROVIDER_SITE_OTHER)
Admission: RE | Admit: 2011-11-12 | Discharge: 2011-11-12 | Disposition: A | Discharge status: Home / Self Care | Payer: Medicaid Other | Source: Ambulatory Visit | Attending: Internal Medicine | Admitting: Internal Medicine

## 2011-11-12 ENCOUNTER — Encounter: Payer: Self-pay | Admitting: Internal Medicine

## 2011-11-12 ENCOUNTER — Ambulatory Visit (INDEPENDENT_AMBULATORY_CARE_PROVIDER_SITE_OTHER): Payer: Medicaid Other | Admitting: Internal Medicine

## 2011-11-12 VITALS — BP 100/62 | HR 76 | Temp 98.4°F | Ht 66.0 in | Wt 144.0 lb

## 2011-11-12 DIAGNOSIS — J90 Pleural effusion, not elsewhere classified: Secondary | ICD-10-CM

## 2011-11-12 DIAGNOSIS — R0989 Other specified symptoms and signs involving the circulatory and respiratory systems: Secondary | ICD-10-CM

## 2011-11-12 NOTE — Assessment & Plan Note (Signed)
cxr stable x 2 years, no further f/u planned

## 2011-11-12 NOTE — Progress Notes (Signed)
Subjective:     Patient ID: Eduardo West, male   DOB: 1961-10-25, 50 y.o.   MRN: [id number]  HPI  Primary [facility] [address]  50 yo vietnamese male quit smoking 2010 extremely difficult hx due to language barrier, even thru fm member serving as interpreter   June 06, 2010 1st pulmonary office eval for large L effusion and co fatigue and sob x months. eval by [facility] with abd ct showing large effusion. minimal L chest discomfort, wt loss ? amt. no cough or leg swelling. no fever or sweats rec tap - 600 cc serous yellow exudate 94% L> mature lymphocytes, rec flow cytometry if recurs.   July 24, 2010 Followup on thoracentesis results. Pt states still has some chest discomfort and tightness- worse with inspiration. He denies SOB or cough. Appetite is poor. cxr no real change, rec return in 3 weeks   August 22, 2010 3 wk followup with cxr. Pt c/o increased SOB- esp at in the early evening. Pt states that he is SOB without any exertion at all assoc with centralized cp with coughing only. effusion did not worsen vs the pots tap previous xrary therefore rec Omeprazole 20 mg Take one 30-60 min before first and last meals of the day  GERD diet   September 11, 2010 cc Dyspnea- the same, no better or worse. not losing any weight. no cough now. afraid to eat but not loosing any wt. rec Continue omeprazole 20mg  Take one 30-60 min before first and last meals of the day   October 09, 2010 ov cp all the time but worse with deep breath and sob, not eating. rec conservative f/u   November 05, 2010 ov no change doe/ cp on ppi. December 08, 2010 --Presents for an acute office visit.Complains of SOB especially at night, some heaviness in chest, occ dry cough. Comes and goes. Was seen by PCP last week and sent for xray which showed no change in left effusion. He cont to feel bad w/no energy.Cough persists w/ intermittent fevers, pain in left rib area. Interpreter from [place]. rec IPPD    December 10, 2010 seen by Dr Delford Field  PPD   positive. Has chronic L effusion. No family exposure. Came to [place] 1992. Was pos on PPD in the past took 9months INH, 1992  Now with chronic effusion. Pt cont to have dry cough and weakness. Pt has intermittent fever. Pt cont to feel very bad.  2-3 months ago had fever and sweating. > health dept eval   December 22, 2010 ov no apparent change in any symptoms x tired from being on tb rx per Health dept. no convincing NS, wt loss, no change on serial cxrs since his orginal tap. rec f/u per HD and return here when released    05/06/2011 ov/Eduardo West discharged by Health dept and  no change at all in chronic chest and abd pain and doe.  rec no change rx, f/u q 3 months until gets primary doctor to follow    08/10/2011 f/u ov/Eduardo West has appt with Clinic in [address] one week cc fatigue and pain/ numbness x one month R leg - has not tried otcs, no fever, no change L cp/ doe x across the parking lot no change No cough. Good appetite.  rec Please schedule a follow up visit in 3 months but call sooner if needed with cxr - when you return bring the name of your primary doctor with you.  In  the meantime,   Advil or aleve can be used as needed with meals for leg pai  11/12/2011 f/u ov/Eduardo West cc exhausted when wakes up, stuffy nose and sob but able to walk - turns out up all night with urinary issues and has f/u with urology scheduled  ROS At present neg for  any significant sore throat, dysphagia, itching, sneezing,  nasal congestion or excess/ purulent secretions,  fever, chills, sweats, unintended wt loss, classically pleuritic or exertional cp, hempoptysis, orthopnea pnd or leg swelling.    Also denies any obvious fluctuation of symptoms with weather or environmental changes or other aggravating or alleviating factors.      Past Medical History:  Diverticulosis  - s/p hemicolectomy and cholecystectomy for ? abscess 01/23/2010  L Pleural effusion developed post op  12/2009 p hemicolectomy - Tcentesis June 06, 2010 600 cc serous yellow exudate 94% L> mature lymphocytes  - Small/ moderated residual effusion July 24, 2010 >> lat decub no sign layering 09/05/10 > improved 05/06/2011  - ESR 55 > 40 > 29 October 09, 2010  Kidney stones  - bilateral, non obstructing by CT 06/05/10  Positive [facility]D Dec 08 2010 [name]  - Rx started Jan 2012 > stopped by health dep 03/2011           Objective:   Physical Exam amb thin vietmamese male doesn't speak any English, depressed with hopeless/ helpless affect / sits slouched over against wall wt 125 July 24, 2010 >  > 125 October 09, 2010 >   128 December 22, 2010 > 136 05/06/2011  > 143 08/10/2011 > 11/12/2011  144  HEENT: nl dentition, turbinates, and orophanx. Nl external ear canals without cough reflex  NECK : without JVD/Nodes/TM/ nl carotid upstrokes bilaterally  LUNGS: decreased bs / dullness at L base  CV: RRR no s3 or murmur or increase in P2, no edema  ABD: soft and nontender with nl excursion in the supine position. No bruits or organomegaly, bowel sounds nl  MS: warm without deformities, calf tenderness, cyanosis or clubbing  SKIN: warm and dry without lesions       CXR  11/12/2011 :  Left basilar scarring with small loculated left pleural fluid and/or thickening.      Assessment:          Plan:

## 2011-11-12 NOTE — Patient Instructions (Addendum)
The fluid in your lung is improving with minimal scarring on the chest xray  Follow up in the pulmonary clinic is only as needed at this point but you need regular follow up by your primary care doctor and urologist

## 2011-11-12 NOTE — Assessment & Plan Note (Addendum)
He appears depressed/ fatigued more than sob ? Related to nocturia but either way  no further pulmonary w/u indicated  Note entire lab battery including esr wnl in 05/2011

## 2012-02-04 ENCOUNTER — Ambulatory Visit (INDEPENDENT_AMBULATORY_CARE_PROVIDER_SITE_OTHER): Payer: Medicaid Other | Admitting: Internal Medicine

## 2012-02-04 ENCOUNTER — Encounter: Payer: Self-pay | Admitting: Internal Medicine

## 2012-02-04 ENCOUNTER — Ambulatory Visit (INDEPENDENT_AMBULATORY_CARE_PROVIDER_SITE_OTHER)
Admission: RE | Admit: 2012-02-04 | Discharge: 2012-02-04 | Disposition: A | Discharge status: Home / Self Care | Payer: Medicaid Other | Source: Ambulatory Visit | Attending: Internal Medicine | Admitting: Internal Medicine

## 2012-02-04 VITALS — BP 122/98 | HR 92 | Temp 97.5°F | Ht 64.0 in | Wt 147.8 lb

## 2012-02-04 DIAGNOSIS — M549 Dorsalgia, unspecified: Secondary | ICD-10-CM | POA: Insufficient documentation

## 2012-02-04 DIAGNOSIS — J90 Pleural effusion, not elsewhere classified: Secondary | ICD-10-CM

## 2012-02-04 DIAGNOSIS — R079 Chest pain, unspecified: Secondary | ICD-10-CM

## 2012-02-04 NOTE — Progress Notes (Signed)
Subjective:     Patient ID: Eduardo West, Eduardo West   DOB: 1961-10-25, 51 y.o.   MRN: [id number]  HPI  Primary =  [facility] [address]  51 yo vietnamese Eduardo West quit smoking 2010 extremely difficult hx due to language barrier, even thru fm member serving as interpreter   June 06, 2010 1st pulmonary office eval for large L effusion and co fatigue and sob x months. eval by [facility] with abd ct showing large effusion. minimal L chest discomfort, wt loss ? amt. no cough or leg swelling. no fever or sweats rec tap - 600 cc serous yellow exudate 94% L> mature lymphocytes, rec flow cytometry if recurs.   July 24, 2010 Followup on thoracentesis results. Eduardo West states still West some chest discomfort and tightness- worse with inspiration. Eduardo West denies SOB or cough. Appetite is poor. cxr no real change, rec return in 3 weeks   August 22, 2010 3 wk followup with cxr. Eduardo West c/o increased SOB- esp at in the early evening. Eduardo West states that Eduardo West is SOB without any exertion at all assoc with centralized cp with coughing only. effusion did not worsen vs the pots tap previous xrary therefore rec Omeprazole 20 mg Take one 30-60 min before first and last meals of the day  GERD diet   September 11, 2010 cc Dyspnea- the same, no better or worse. not losing any weight. no cough now. afraid to eat but not loosing any wt. rec Continue omeprazole 20mg  Take one 30-60 min before first and last meals of the day   October 09, 2010 ov cp all the time but worse with deep breath and sob, not eating. rec conservative f/u   November 05, 2010 ov no change doe/ cp on ppi. December 08, 2010 --Presents for an acute office visit.Complains of SOB especially at night, some heaviness in chest, occ dry cough. Comes and goes. Was seen by PCP last week and sent for xray which showed no change in left effusion. Eduardo West West to feel bad w/no energy.Cough persists w/ intermittent fevers, pain in left rib area. Interpreter from [place]. rec IPPD    December 10, 2010 seen by Dr Eduardo West  PPD   positive. West chronic L effusion. No family exposure. Came to [place] 1992. Was pos on PPD in the past took 9months INH, 1992  Now with chronic effusion. Eduardo West to have dry cough and weakness. Eduardo West intermittent fever. Eduardo West to feel very bad.  2-3 months ago had fever and sweating. > health dept eval   December 22, 2010 ov no apparent change in any symptoms x tired from being on tb rx per Health dept. no convincing NS, wt loss, no change on serial cxrs since his orginal tap. rec f/u per HD and return here when released    05/06/2011 ov/Eduardo West discharged by Health dept and  no change at all in chronic chest and abd pain and doe.  rec no change rx, f/u q 3 months until gets primary doctor to follow    08/10/2011 f/u ov/Eduardo West appt with Clinic in [address] one week cc fatigue and pain/ numbness x one month R leg - West not tried otcs, no fever, no change L cp/ doe x across the parking lot no change No cough. Good appetite.  rec Please schedule a follow up visit in 3 months but call sooner if needed with cxr - when you return bring the name of your primary doctor with you.  In  the meantime,   Advil or aleve can be used as needed with meals for leg pai  11/12/2011 f/u ov/Eduardo West cc exhausted when wakes up, stuffy nose and sob but able to walk - turns out up all night with urinary issues and West f/u with urology scheduled. rec The fluid in your lung is improving with minimal scarring on the chest xray Follow up in the pulmonary clinic is only as needed at this point but you need regular follow up by your primary care doctor and urologist    02/04/2012 f/u ov/Eduardo West cc new onset bilateral mid back pain positional, better supine first noted over a year ago. Breathing ok and no cough. Overall pain pattern waxes and wanes but at present is no where near as severe as before. No limiting sob. Never found anything that makes it better  Sleeping ok without nocturnal  or  early am exacerbation  of respiratory  c/o's or need for noct saba. Also denies any obvious fluctuation of symptoms with weather or environmental changes or other aggravating or alleviating factors except as outlined above   ROS At present neg for  any significant sore throat, dysphagia, itching, sneezing,  nasal congestion or excess/ purulent secretions,  fever, chills, sweats, unintended wt loss, classically pleuritic or exertional cp, hempoptysis, orthopnea pnd or leg swelling.        Past Medical History:  Diverticulosis  - s/p hemicolectomy and cholecystectomy for ? abscess 01/23/2010  L Pleural effusion developed post op 12/2009 p hemicolectomy - Tcentesis June 06, 2010 600 cc serous yellow exudate 94% L> mature lymphocytes  - Small/ moderated residual effusion July 24, 2010 >> lat decub no sign layering 09/05/10 > improved 05/06/2011  - ESR 55 > 40 > 29 October 09, 2010  Kidney stones  - bilateral, non obstructing by CT 06/05/10  [facility] Dec 08 2010 [name]  - Rx started Jan 2012 > stopped by health dep 03/2011           Objective:   Physical Exam amb thin vietmamese Eduardo West doesn't speak any English, depressed with hopeless/ helpless affect / sits slouched over against wall wt 125 July 24, 2010 >  > 125 October 09, 2010 >   128 December 22, 2010 > 136 05/06/2011  > 143 08/10/2011 > 11/12/2011  144 > 147 02/04/2012  HEENT: nl dentition, turbinates, and orophanx. Nl external ear canals without cough reflex  NECK : without JVD/Nodes/TM/ nl carotid upstrokes bilaterally  LUNGS: decreased bs / dullness at L base  CV: RRR no s3 or murmur or increase in P2, no edema  ABD: soft and nontender with nl excursion in the supine position. No bruits or organomegaly, bowel sounds nl  MS: warm without deformities, calf tenderness, cyanosis or clubbing  SKIN: warm and dry without lesions       CXR  11/12/2011 :  Left basilar scarring  with small loculated left pleural fluid and/or thickening.    CXR  02/04/2012 :  Stable tiny residual left pleural effusion versus pleural thickening.    Assessment:          Plan:

## 2012-02-04 NOTE — Patient Instructions (Signed)
Please remember to go to the lab and  x-ray department downstairs for your tests - we will call you with the results when they are available.    Treatment consists of avoiding foods that cause gas (especially beans and raw vegetables like spinach and salads and boiled eggs)  and  Take citrucel 1 heaping tsp twice daily with a large glass of water.  Pain should improve w/in 2 weeks and if not then consider further work up - return to your primary doctor first.

## 2012-02-07 DIAGNOSIS — R079 Chest pain, unspecified: Secondary | ICD-10-CM | POA: Insufficient documentation

## 2012-02-07 NOTE — Assessment & Plan Note (Signed)
This has all the typical features of ibs although it is posterior instead of anterior ? Because of prior abd surgery?   Classic subdiaphragmatic pain pattern suggests ibs:  Stereotypical, migratory with a very limited distribution of pain locations, daytime, not exacerbated by ex or coughing, worse in sitting position, associated with generalized abd bloating, not present supine due to the dome effect of the diaphragm is  canceled in that position. Frequently these patients have had multiple negative GI workups and CT scans.  Treatment consists of avoiding foods that cause gas (especially beans and raw vegetables like spinach and salads)  and citrucel 1 heaping tsp twice daily with a large glass of water.  Pain should improve w/in 2 weeks and if not then consider further GI work up.

## 2012-02-07 NOTE — Assessment & Plan Note (Signed)
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

## 2012-03-04 ENCOUNTER — Ambulatory Visit (INDEPENDENT_AMBULATORY_CARE_PROVIDER_SITE_OTHER): Payer: Medicaid Other | Admitting: General Surgery

## 2012-03-04 ENCOUNTER — Encounter (INDEPENDENT_AMBULATORY_CARE_PROVIDER_SITE_OTHER): Payer: Self-pay | Admitting: General Surgery

## 2012-03-04 DIAGNOSIS — R197 Diarrhea, unspecified: Secondary | ICD-10-CM

## 2012-03-04 DIAGNOSIS — K529 Noninfective gastroenteritis and colitis, unspecified: Secondary | ICD-10-CM

## 2012-03-04 DIAGNOSIS — R109 Unspecified abdominal pain: Secondary | ICD-10-CM

## 2012-03-04 DIAGNOSIS — G8929 Other chronic pain: Secondary | ICD-10-CM | POA: Insufficient documentation

## 2012-03-04 HISTORY — DX: Noninfective gastroenteritis and colitis, unspecified: K52.9

## 2012-03-04 NOTE — Progress Notes (Signed)
Chief complaint: Abdominal pain and diarrhea  History: Patient returns to the office. He has a history of emergency partial right colectomy as well as cholecystectomy for perforated cecal diverticulitis. I have seen him on a number of occasions since then with complaints essentially identical to today. He complains of recurrent abdominal pain which is in his central abdomen and around his incision. This is related both to eating and to activity. He also has significant diarrhea after eating. He complains of bloating and borborygmi. I had previously placed him on Questran powder which had helped the diarrhea and he is requesting a refill of this. His symptoms really are no better or no worse than they were when I saw him last fall. We obtained a CT scan at that time which really showed no significant abdominal issues. He did have a large pleural effusion last year treated by Dr. Sherene Sires and possibly due to TB. This had resolved on followup.  Exam: BP 124/90  Pulse 89  Temp(Src) 97.6 F (36.4 C) (Temporal)  Ht 5\' 5"  (1.651 m)  Wt 146 lb 3.2 oz (66.316 kg)  BMI 24.33 kg/m2  SpO2 98%  Gen.: He appears well-nourished and in no distress. Lymph nodes: No cervical, supraclavicular or inguinal palpable Lungs:Breathsoundsareclearandequalbilaterally Abdomen: Nondistended. Healed midline and right lower quadrant incisions. Bowel sounds normal. There is mild diffuse tenderness and no masses or organomegaly.  Assessment and plan: Chronic abdominal pain and diarrhea since ileocolectomy and cholecystectomy in February of 2011 for perforated cecal diverticulitis. His diarrhea certainly could be explained on the basis of his surgical procedures. We will refill his Lanetta Inch which has helped in the past. I do not have a good explanation for his abdominal pain. I had previously suggested GI referral to consider endoscopy or other options for medical management and he would like to do this.

## 2012-03-15 ENCOUNTER — Telehealth: Payer: Self-pay | Admitting: Internal Medicine

## 2012-03-15 NOTE — Telephone Encounter (Signed)
Called, spoke with Marisue Humble with [facility] office.  States on March 26 she faxed a letter requesting Dr. Sherene Sires to fill out a form which was attached or write a letter.  She is calling to check on the status of this as pt has a Social Security/Disability Hearing on Monday, April 22.  She has called Medical Records and was told this would have been forwarded to Dr. Sherene Sires.  Dr. Sherene Sires, have you seen this?  Note: Marisue Humble refaxed this to triage incase it was not received prior.  Fax placed in MW's to do.

## 2012-03-15 NOTE — Telephone Encounter (Signed)
Haven't seen any forms

## 2012-03-16 NOTE — Telephone Encounter (Signed)
Form faxed to [phone], Marisue Humble is aware. Carron Curie, CMA

## 2012-03-16 NOTE — Telephone Encounter (Signed)
Forms completed 03/16/2012

## 2012-03-16 NOTE — Telephone Encounter (Signed)
Forms are in your lookat now

## 2012-04-04 ENCOUNTER — Ambulatory Visit (INDEPENDENT_AMBULATORY_CARE_PROVIDER_SITE_OTHER)
Admission: RE | Admit: 2012-04-04 | Discharge: 2012-04-04 | Disposition: A | Discharge status: Home / Self Care | Payer: Medicaid Other | Source: Ambulatory Visit | Attending: Internal Medicine | Admitting: Internal Medicine

## 2012-04-04 ENCOUNTER — Encounter: Payer: Self-pay | Admitting: Internal Medicine

## 2012-04-04 ENCOUNTER — Ambulatory Visit (INDEPENDENT_AMBULATORY_CARE_PROVIDER_SITE_OTHER): Payer: Medicaid Other | Admitting: Internal Medicine

## 2012-04-04 VITALS — BP 122/90 | HR 88 | Temp 98.0°F | Wt 144.0 lb

## 2012-04-04 DIAGNOSIS — J90 Pleural effusion, not elsewhere classified: Secondary | ICD-10-CM

## 2012-04-04 DIAGNOSIS — M549 Dorsalgia, unspecified: Secondary | ICD-10-CM

## 2012-04-04 NOTE — Patient Instructions (Addendum)
Please remember to go to the x-ray department downstairs for your tests - we will call you with the results when they are available- if xray is stable no further pulmonary follow up is needed  Return to Dr Mayford Knife, your primary for further evaluation of your back pain

## 2012-04-04 NOTE — Assessment & Plan Note (Signed)
L Pleural effusion developed post op 12/2009 p hemicolectomy - Tcentesis June 06, 2010 600 cc serous yellow exudate 94% L> mature lymphocytes  - Small/ moderated residual effusion July 24, 2010 >> lat decub no sign layering 09/05/10 > improved 05/06/2011  - ESR 55 > 40 > 29 October 09, 2010  cxr no change 04/04/2012 > no further f/u needed

## 2012-04-04 NOTE — Assessment & Plan Note (Signed)
C/w mechanical/ musculoskeletal pain, bilateral mid to low back, not assoc with L effusion/ pleural scarring, f/u per Primary md

## 2012-04-04 NOTE — Progress Notes (Signed)
Subjective:     Patient ID: Eduardo West, male   DOB: 1961/08/12, 51 y.o.   MRN: [id number]  HPI  [facility] [address]  51 yo vietnamese male quit smoking 2010 extremely difficult hx due to language barrier, even thru fm member serving as interpreter   June 06, 2010 1st pulmonary office eval for large L effusion and co fatigue and sob x months. eval by [facility] with abd ct showing large effusion. minimal L chest discomfort, wt loss ? amt. no cough or leg swelling. no fever or sweats rec tap - 600 cc serous yellow exudate 94% L> mature lymphocytes, rec flow cytometry if recurs.   July 24, 2010 Followup on thoracentesis results. Pt states still has some chest discomfort and tightness- worse with inspiration. He denies SOB or cough. Appetite is poor. cxr no real change, rec return in 3 weeks   August 22, 2010 3 wk followup with cxr. Pt c/o increased SOB- esp at in the early evening. Pt states that he is SOB without any exertion at all assoc with centralized cp with coughing only. effusion did not worsen vs the pots tap previous xrary therefore rec Omeprazole 20 mg Take one 30-60 min before first and last meals of the day  GERD diet   September 11, 2010 cc Dyspnea- the same, no better or worse. not losing any weight. no cough now. afraid to eat but not loosing any wt. rec Continue omeprazole 20mg  Take one 30-60 min before first and last meals of the day   October 09, 2010 ov cp all the time but worse with deep breath and sob, not eating. rec conservative f/u   November 05, 2010 ov no change doe/ cp on ppi. December 08, 2010 --Presents for an acute office visit.Complains of SOB especially at night, some heaviness in chest, occ dry cough. Comes and goes. Was seen by PCP last week and sent for xray which showed no change in left effusion. He cont to feel bad w/no energy.Cough persists w/ intermittent fevers, pain in left rib area. Interpreter from [place]. rec IPPD    December 10, 2010 seen by Dr Delford Field  PPD   positive. Has chronic L effusion. No family exposure. Came to [place] 1992. Was pos on PPD in the past took 9months INH, 1992  Now with chronic effusion. Pt cont to have dry cough and weakness. Pt has intermittent fever. Pt cont to feel very bad.  2-3 months ago had fever and sweating. > health dept eval   December 22, 2010 ov no apparent change in any symptoms x tired from being on tb rx per Health dept. no convincing NS, wt loss, no change on serial cxrs since his orginal tap. rec f/u per HD and return here when released    05/06/2011 ov/[name] discharged by Health dept and  no change at all in chronic chest and abd pain and doe.  rec no change rx, f/u q 3 months until gets primary doctor to follow        02/04/2012 f/u ov/[name] cc new onset bilateral mid back pain positional, better supine first noted over a year ago. Breathing ok and no cough. Overall pain pattern waxes and wanes but at present is no where near as severe as before. No limiting sob. Never found anything that makes it better. rec rx as IBS, no pulmonary f/u needed.    04/04/2012 f/u ov/[name] cc no better bilateral positional mid back pain, no  assoc cough or sob. Not clear he tried any of the measures rec to date nor has a primary doctor.   Sleeping ok without nocturnal  or early am exacerbation  of respiratory  c/o's or need for noct saba. Also denies any obvious fluctuation of symptoms with weather or environmental changes or other aggravating or alleviating factors except as outlined above   ROS At present neg for  any significant sore throat, dysphagia, itching, sneezing,  nasal congestion or excess/ purulent secretions,  fever, chills, sweats, unintended wt loss, classically pleuritic or exertional cp, hempoptysis, orthopnea pnd or leg swelling.        Past Medical History:  Diverticulosis  - s/p hemicolectomy and cholecystectomy for ? abscess 01/23/2010  L Pleural effusion  developed post op 12/2009 p hemicolectomy - Tcentesis June 06, 2010 600 cc serous yellow exudate 94% L> mature lymphocytes  - Small/ moderated residual effusion July 24, 2010 >> lat decub no sign layering 09/05/10 > improved 05/06/2011  - ESR 55 > 40 > 29 October 09, 2010  Kidney stones  - bilateral, non obstructing by CT 06/05/10  [facility] Dec 08 2010 [name]  - Rx started Jan 2012 > stopped by health dep 03/2011           Objective:   Physical Exam amb thin vietmamese male doesn't speak any English, depressed with hopeless/ helpless affect / sits slouched over against wall wt 125 July 24, 2010 >  > 125 October 09, 2010 >   128 December 22, 2010 > 136 05/06/2011  > 143 08/10/2011 > 11/12/2011  144 > 147 02/04/2012 > 04/04/2012 144 HEENT: nl dentition, turbinates, and orophanx. Nl external ear canals without cough reflex  NECK : without JVD/Nodes/TM/ nl carotid upstrokes bilaterally  LUNGS: decreased bs / dullness at L base  CV: RRR no s3 or murmur or increase in P2, no edema  ABD: soft and nontender with nl excursion in the supine position. No bruits or organomegaly, bowel sounds nl  MS: warm without deformities, calf tenderness, cyanosis or clubbing  SKIN: warm and dry without lesions       CXR  04/04/2012 :  Stable appearance of the chest with chronically blunted left lateral costophrenic angle, and scarring at the left lung apex.     Assessment:          Plan:

## 2012-04-22 ENCOUNTER — Telehealth (INDEPENDENT_AMBULATORY_CARE_PROVIDER_SITE_OTHER): Payer: Self-pay

## 2012-04-22 NOTE — Telephone Encounter (Signed)
Patient came into office today requesting to schedule an appointment with Dr. Johna Sheriff, patient was last seen in April and referred to [facility] for further evaluation of his abdominal pain.  [name] previously called Eduardo West to schedule and appointment but they were not successful in contacting the patient or him returning their call.  Phone number & address given to patient to call and schedule appointment at his convenience.

## 2012-05-09 ENCOUNTER — Encounter: Payer: Self-pay | Admitting: Gastroenterology

## 2012-06-17 ENCOUNTER — Telehealth (INDEPENDENT_AMBULATORY_CARE_PROVIDER_SITE_OTHER): Payer: Self-pay

## 2012-06-17 NOTE — Telephone Encounter (Signed)
Called patient and tried to explain that he need's to see his PCP for his preventative health concerns.  Patient appointment for 06/21/12 has been cancelled due to surgery add on, will review with Dr. Johna Sheriff and call patient next week.

## 2012-06-21 ENCOUNTER — Encounter (INDEPENDENT_AMBULATORY_CARE_PROVIDER_SITE_OTHER): Payer: Medicaid Other | Admitting: General Surgery

## 2012-07-15 ENCOUNTER — Other Ambulatory Visit: Payer: Self-pay | Admitting: Specialist

## 2012-07-15 DIAGNOSIS — H538 Other visual disturbances: Secondary | ICD-10-CM

## 2012-07-19 ENCOUNTER — Ambulatory Visit
Admission: RE | Admit: 2012-07-19 | Discharge: 2012-07-19 | Disposition: A | Discharge status: Home / Self Care | Payer: Medicaid Other | Source: Ambulatory Visit | Attending: Specialist | Admitting: Specialist

## 2012-07-19 DIAGNOSIS — H538 Other visual disturbances: Secondary | ICD-10-CM

## 2012-08-23 ENCOUNTER — Ambulatory Visit (INDEPENDENT_AMBULATORY_CARE_PROVIDER_SITE_OTHER)
Admission: RE | Admit: 2012-08-23 | Discharge: 2012-08-23 | Disposition: A | Discharge status: Home / Self Care | Payer: Medicaid Other | Source: Ambulatory Visit | Attending: Internal Medicine | Admitting: Internal Medicine

## 2012-08-23 ENCOUNTER — Ambulatory Visit (INDEPENDENT_AMBULATORY_CARE_PROVIDER_SITE_OTHER): Payer: Medicaid Other | Admitting: Internal Medicine

## 2012-08-23 ENCOUNTER — Encounter: Payer: Self-pay | Admitting: Internal Medicine

## 2012-08-23 VITALS — BP 120/90 | HR 80 | Temp 97.5°F | Ht 66.0 in | Wt 147.0 lb

## 2012-08-23 DIAGNOSIS — J90 Pleural effusion, not elsewhere classified: Secondary | ICD-10-CM

## 2012-08-23 DIAGNOSIS — R0609 Other forms of dyspnea: Secondary | ICD-10-CM

## 2012-08-23 DIAGNOSIS — R0989 Other specified symptoms and signs involving the circulatory and respiratory systems: Secondary | ICD-10-CM

## 2012-08-23 NOTE — Assessment & Plan Note (Signed)
L Pleural effusion developed post op 12/2009 p hemicolectomy - Tcentesis June 06, 2010 600 cc serous yellow exudate 94% L> mature lymphocytes  - Small/ moderated residual effusion July 24, 2010 >> lat decub no sign layering 09/05/10 > improved 05/06/2011  > no change 08/23/2012  - ESR 55 > 40 > 29 October 09, 2010  Not likely to see any further changes on cxr at this point and unless symptoms change convincingly does not need further pulmonary eval or repeat cxr

## 2012-08-23 NOTE — Assessment & Plan Note (Addendum)
-   08/10/2011  Walked RA x 2laps @ 185 ft each stopped due to fatigue and leg pains, no desat     - 08/23/2012   Walked RA x one lap @ 185 stopped due to  Tired, not really tachypeic, tachycardic nor desat  Appears to be largely functional sob at this point with depression/deconditioning likely, no further pulmonary eval indicated > referred back to primary

## 2012-08-23 NOTE — Progress Notes (Signed)
Subjective:     Patient ID: Eduardo West, male   DOB: [birth year] .   MRN: [id number]  HPI  Primary =  [facility] [address]  51 yo vietnamese male quit smoking 2010 extremely difficult hx due to language barrier, even thru fm member serving as interpreter   June 06, 2010 1st pulmonary office eval for large L effusion and co fatigue and sob x months. eval by [facility] with abd ct showing large effusion. minimal L chest discomfort, wt loss ? amt. no cough or leg swelling. no fever or sweats rec tap - 600 cc serous yellow exudate 94% L> mature lymphocytes, rec flow cytometry if recurs.   July 24, 2010 Followup on thoracentesis results. Pt states still has some chest discomfort and tightness- worse with inspiration. He denies SOB or cough. Appetite is poor. cxr no real change, rec return in 3 weeks   August 22, 2010 3 wk followup with cxr. Pt c/o increased SOB- esp at in the early evening. Pt states that he is SOB without any exertion at all assoc with centralized cp with coughing only. effusion did not worsen vs the pots tap previous xrary therefore rec Omeprazole 20 mg Take one 30-60 min before first and last meals of the day  GERD diet   September 11, 2010 cc Dyspnea- the same, no better or worse. not losing any weight. no cough now. afraid to eat but not loosing any wt. rec Continue omeprazole 81m Take one 30-60 min before first and last meals of the day   October 09, 2010 ov cp all the time but worse with deep breath and sob, not eating. rec conservative f/u   November 05, 2010 ov no change doe/ cp on ppi. December 08, 2010 --Presents for an acute office visit.Complains of SOB especially at night, some heaviness in chest, occ dry cough. Comes and goes. Was seen by PCP last week and sent for xray which showed no change in left effusion. He cont to feel bad w/no energy.Cough persists w/ intermittent fevers, pain in left rib area. Interpreter [place] [facility]   December 10, 2010 seen by Dr WJoya Gaskins PPD   positive. Has chronic L effusion. No family exposure. [id number] Was pos on PPD in the past took 948monthINH, 1992  Now with chronic effusion. Pt cont to have dry cough and weakness. Pt has intermittent fever. Pt cont to feel very bad.  2-3 months ago had fever and sweating. > health dept eval   December 22, 2010 ov no apparent change in any symptoms x tired from being on tb rx per Health dept. no convincing NS, wt loss, no change on serial cxrs since his orginal tap. rec f/u per HD and return here when released    05/06/2011 ov/Tempie Gibeault discharged by Health dept and  no change at all in chronic chest and abd pain and doe.  rec no change rx, f/u q 3 months until gets primary doctor to follow.        02/04/2012 f/u ov/Shakemia Madera cc new onset bilateral mid back pain positional, better supine first noted over a year ago. Breathing ok and no cough. Overall pain pattern waxes and wanes but at present is no where near as severe as before. No limiting sob. Never found anything that makes it better. rec rx as IBS, no pulmonary f/u needed.    04/04/2012 f/u ov/Keilee Denman cc no better bilateral positional mid back pain, no assoc  cough or sob. Not clear he tried any of the measures rec to date nor has a primary doctor. rec Please remember to go to the x-ray > no change  Return to Dr Mayford Knife, your primary for further evaluation of your back pain   08/23/2012 f/u ov/Lily Velasquez cc "no better" cc feeling tired, doe x 50 ft ,  No change pattern of chest pain, No obvious daytime variabilty or assoc chronic cough chest tightness, subjective wheeze overt sinus or hb symptoms. No unusual exp hx    Sleeping ok without nocturnal  or early am exacerbation  of respiratory  c/o's or need for noct saba. Also denies any obvious fluctuation of symptoms with weather or environmental changes or other aggravating or alleviating factors except as outlined above   ROS  The following are not active complaints  unless bolded sore throat, dysphagia, dental problems, itching, sneezing,  nasal congestion or excess/ purulent secretions, ear ache,   fever, chills, sweats, unintended wt loss, pleuritic or exertional cp, hemoptysis,  orthopnea pnd or leg swelling, presyncope, palpitations, heartburn, abdominal pain, anorexia, nausea, vomiting, diarrhea  or change in bowel or urinary habits, change in stools or urine, dysuria,hematuria,  rash, arthralgias, visual complaints, headache, numbness weakness or ataxia or problems with walking or coordination,  change in mood/affect or memory.          Past Medical History:  Diverticulosis  - s/p hemicolectomy and cholecystectomy for ? abscess 01/23/2010  L Pleural effusion developed post op 12/2009 p hemicolectomy - Tcentesis June 06, 2010 600 cc serous yellow exudate 94% L> mature lymphocytes  - Small/ moderated residual effusion July 24, 2010 >> lat decub no sign layering 09/05/10 > improved 05/06/2011  - ESR 55 > 40 > 29 October 09, 2010  Kidney stones  - bilateral, non obstructing by CT 06/05/10  [facility] Dec 08 2010 [name]  - Rx started Jan 2012 > stopped by health dep 03/2011           Objective:   Physical Exam amb thin vietmamese male doesn't speak any [place], depressed with hopeless/ helpless affect / sits slouched over against wall wt 125 July 24, 2010 >  > 125 October 09, 2010 >   128 December 22, 2010 > 136 05/06/2011  > 143 08/10/2011 > 11/12/2011  144 > 147 02/04/2012 > 04/04/2012 144 > 08/23/2012 147 HEENT: nl dentition, turbinates, and orophanx. Nl external ear canals without cough reflex  NECK : without JVD/Nodes/TM/ nl carotid upstrokes bilaterally  LUNGS: decreased bs / dullness at L base  CV: RRR no s3 or murmur or increase in P2, no edema  ABD: soft and nontender with nl excursion in the supine position. No bruits or organomegaly, bowel sounds nl  MS: warm without deformities,  calf tenderness, cyanosis or clubbing  SKIN: warm and dry without lesions       CXR  08/23/2012 : No change L pleural scar      Assessment:          Plan:

## 2012-08-23 NOTE — Patient Instructions (Signed)
Please remember to go to the x-ray department downstairs for your tests - we will call you with the results when they are available.  Dr Mayford Knife can you refer you back here if needed

## 2012-10-10 ENCOUNTER — Emergency Department (HOSPITAL_COMMUNITY): Payer: Medicaid Other

## 2012-10-10 ENCOUNTER — Encounter (HOSPITAL_COMMUNITY): Payer: Self-pay

## 2012-10-10 ENCOUNTER — Inpatient Hospital Stay (HOSPITAL_COMMUNITY)
Admission: EM | Admit: 2012-10-10 | Discharge: 2012-10-15 | DRG: 389 | Disposition: A | Discharge status: Home / Self Care | Payer: Medicaid Other | Attending: General Surgery | Admitting: General Surgery

## 2012-10-10 DIAGNOSIS — Z9049 Acquired absence of other specified parts of digestive tract: Secondary | ICD-10-CM

## 2012-10-10 DIAGNOSIS — R109 Unspecified abdominal pain: Secondary | ICD-10-CM | POA: Diagnosis present

## 2012-10-10 DIAGNOSIS — J90 Pleural effusion, not elsewhere classified: Secondary | ICD-10-CM | POA: Diagnosis present

## 2012-10-10 DIAGNOSIS — R7611 Nonspecific reaction to tuberculin skin test without active tuberculosis: Secondary | ICD-10-CM | POA: Diagnosis present

## 2012-10-10 DIAGNOSIS — E876 Hypokalemia: Secondary | ICD-10-CM | POA: Diagnosis present

## 2012-10-10 DIAGNOSIS — K56609 Unspecified intestinal obstruction, unspecified as to partial versus complete obstruction: Principal | ICD-10-CM | POA: Diagnosis present

## 2012-10-10 HISTORY — DX: Noninfective gastroenteritis and colitis, unspecified: K52.9

## 2012-10-10 HISTORY — DX: Diverticulitis of large intestine without perforation or abscess without bleeding: K57.32

## 2012-10-10 LAB — POCT I-STAT, CHEM 8
BUN: 16 mg/dL (ref 6–23)
Calcium, Ion: 1.09 mmol/L — ABNORMAL LOW (ref 1.12–1.23)
Chloride: 102 mEq/L (ref 96–112)
HCT: 43 % (ref 39.0–52.0)
Potassium: 4.2 mEq/L (ref 3.5–5.1)

## 2012-10-10 LAB — COMPREHENSIVE METABOLIC PANEL
AST: 43 U/L — ABNORMAL HIGH (ref 0–37)
Albumin: 3.7 g/dL (ref 3.5–5.2)
Alkaline Phosphatase: 73 U/L (ref 39–117)
Chloride: 100 mEq/L (ref 96–112)
Potassium: 4 mEq/L (ref 3.5–5.1)
Total Bilirubin: 1.4 mg/dL — ABNORMAL HIGH (ref 0.3–1.2)
Total Protein: 6.9 g/dL (ref 6.0–8.3)

## 2012-10-10 LAB — URINALYSIS, ROUTINE W REFLEX MICROSCOPIC
Glucose, UA: NEGATIVE mg/dL
Hgb urine dipstick: NEGATIVE
Ketones, ur: 15 mg/dL — AB
Leukocytes, UA: NEGATIVE
Protein, ur: NEGATIVE mg/dL
pH: 6 (ref 5.0–8.0)

## 2012-10-10 LAB — CBC WITH DIFFERENTIAL/PLATELET
Basophils Absolute: 0 10*3/uL (ref 0.0–0.1)
Basophils Relative: 0 % (ref 0–1)
Eosinophils Absolute: 0 10*3/uL (ref 0.0–0.7)
MCH: 31.1 pg (ref 26.0–34.0)
MCHC: 33.2 g/dL (ref 30.0–36.0)
Neutro Abs: 11.8 10*3/uL — ABNORMAL HIGH (ref 1.7–7.7)
Neutrophils Relative %: 89 % — ABNORMAL HIGH (ref 43–77)
Platelets: 308 10*3/uL (ref 150–400)
RDW: 11.8 % (ref 11.5–15.5)

## 2012-10-10 LAB — LACTIC ACID, PLASMA: Lactic Acid, Venous: 1.6 mmol/L (ref 0.5–2.2)

## 2012-10-10 MED ORDER — SODIUM CHLORIDE 0.9 % IV BOLUS (SEPSIS)
1000.0000 mL | Freq: Once | INTRAVENOUS | Status: AC
  Administered 2012-10-10: 1000 mL via INTRAVENOUS

## 2012-10-10 MED ORDER — HYDROMORPHONE HCL PF 1 MG/ML IJ SOLN
1.0000 mg | Freq: Once | INTRAMUSCULAR | Status: AC
  Administered 2012-10-10: 1 mg via INTRAVENOUS
  Filled 2012-10-10: qty 1

## 2012-10-10 MED ORDER — LIDOCAINE HCL 2 % EX GEL
CUTANEOUS | Status: AC
  Administered 2012-10-10: 10
  Filled 2012-10-10: qty 10

## 2012-10-10 MED ORDER — IOHEXOL 300 MG/ML  SOLN
80.0000 mL | Freq: Once | INTRAMUSCULAR | Status: AC | PRN
  Administered 2012-10-10: 80 mL via INTRAVENOUS

## 2012-10-10 MED ORDER — ONDANSETRON HCL 4 MG/2ML IJ SOLN
4.0000 mg | Freq: Once | INTRAMUSCULAR | Status: AC
  Administered 2012-10-10: 4 mg via INTRAVENOUS
  Filled 2012-10-10: qty 2

## 2012-10-10 MED ORDER — HYDROMORPHONE HCL PF 1 MG/ML IJ SOLN
1.0000 mg | Freq: Once | INTRAMUSCULAR | Status: AC
  Administered 2012-10-10: 1 mg via INTRAVENOUS

## 2012-10-10 MED ORDER — HYDROMORPHONE HCL PF 1 MG/ML IJ SOLN
1.0000 mg | Freq: Once | INTRAMUSCULAR | Status: DC
  Filled 2012-10-10: qty 1

## 2012-10-10 NOTE — ED Notes (Signed)
Pt tolerated NG tube well. Hooked to low interment suction. Pt informed of plan of care.

## 2012-10-10 NOTE — ED Provider Notes (Addendum)
History     CSN: [id number]  Arrival date & time 10/10/12  1709   First MD Initiated Contact with Patient 10/10/12 1748      Chief Complaint  Patient presents with  . Abdominal Pain    (Consider location/radiation/quality/duration/timing/severity/associated sxs/prior treatment) Patient is a 51 y.o. male presenting with abdominal pain. The history is provided by the patient and a relative. The history is limited by a language barrier. Language interpreter used: daughter interprets.  Abdominal Pain The primary symptoms of the illness include abdominal pain, fever, nausea and vomiting. The primary symptoms of the illness do not include shortness of breath, diarrhea or dysuria. Episode onset: started at 1am today. The onset of the illness was sudden. The problem has not changed since onset. The pain came on suddenly. The abdominal pain has been unchanged since its onset. The abdominal pain is located in the periumbilical region (most of the pain is around the surgical incision of his abd). The abdominal pain does not radiate. The severity of the abdominal pain is 10/10. The abdominal pain is relieved by nothing. The abdominal pain is exacerbated by vomiting, movement and eating.  The vomiting began today. Vomiting occurs 2 to 5 times per day.  The patient has not had a change in bowel habit. Additional symptoms associated with the illness include chills and anorexia. Symptoms associated with the illness do not include urgency, frequency or back pain. Significant associated medical issues include gallstones and diverticulitis.    Past Medical History  Diagnosis Date  . Diverticulosis   . Pleural effusion, left   . Kidney stones   . 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 Hypertension   . Diarrhea   . Abdominal pain   . Buzzing in ear   . Right leg numbness     Past Surgical History  Procedure Date  . Ileohemicolectomy 12/2009  . Cholecystectomy 12/2009  . Appendectomy 2003    Family History    Problem Relation Age of Onset  . Cancer Mother     History  Substance Use Topics  . Smoking status: Former Smoker -- 0.5 packs/day for 20 years    Quit date: 12/01/2007  . Smokeless tobacco: Never Used  . Alcohol Use: No      Review of Systems  Constitutional: Positive for fever and chills.  Respiratory: Negative for shortness of breath.   Gastrointestinal: Positive for nausea, vomiting, abdominal pain and anorexia. Negative for diarrhea.  Genitourinary: Negative for dysuria, urgency and frequency.  Musculoskeletal: Negative for back pain.  All other systems reviewed and are negative.    Allergies  Review of patient's allergies indicates no known allergies.  Home Medications   Current Outpatient Rx  Name  Route  Sig  Dispense  Refill  . BUTALBITAL-APAP-CAFFEINE 50-325-40 MG PO TABS   Oral   Take 1 tablet by mouth 2 (two) times daily as needed. migraine         . HYDROXYZINE PAMOATE 25 MG PO CAPS   Oral   Take 25 mg by mouth at bedtime.         [name] [facility] NAPROXEN 500 MG PO TABS   Oral   Take 500 mg by mouth 2 (two) times daily as needed. Pain         . TAMSULOSIN HCL 0.4 MG PO CAPS   Oral   Take 0.4 mg by mouth at bedtime.           BP 155/89  Pulse 84  Temp 98.6 F (37  C) (Oral)  Resp 18  SpO2 98%  Physical Exam  Nursing note and vitals reviewed. Constitutional: He is oriented to person, place, and time. He appears well-developed and well-nourished. He appears distressed.       Writhing around in the bed grabbing his abdomen  HENT:  Head: Normocephalic and atraumatic.  Mouth/Throat: Oropharynx is clear and moist. Mucous membranes are dry.  Eyes: Conjunctivae normal and EOM are normal. Pupils are equal, round, and reactive to light.  Neck: Normal range of motion. Neck supple.  Cardiovascular: Normal rate, regular rhythm and intact distal pulses.   No murmur heard. Pulmonary/Chest: Effort normal and breath sounds normal. No respiratory distress. He  has no wheezes. He has no rales.  Abdominal: Soft. He exhibits no distension and no ascites. There is tenderness in the periumbilical area. There is guarding. There is no rebound and no CVA tenderness.    Musculoskeletal: Normal range of motion. He exhibits no edema and no tenderness.  Neurological: He is alert and oriented to person, place, and time.  Skin: Skin is warm and dry. No rash noted. No erythema.  Psychiatric: He has a normal mood and affect. His behavior is normal.    ED Course  Procedures (including critical care time)  Labs Reviewed  CBC WITH DIFFERENTIAL - Abnormal; Notable for the following:    WBC 13.2 (*)     Neutrophils Relative 89 (*)     Neutro Abs 11.8 (*)     Lymphocytes Relative 7 (*)     All other components within normal limits  COMPREHENSIVE METABOLIC PANEL - Abnormal; Notable for the following:    Glucose, Bld 115 (*)     AST 43 (*)     Total Bilirubin 1.4 (*)     All other components within normal limits  POCT I-STAT, CHEM 8 - Abnormal; Notable for the following:    Glucose, Bld 111 (*)     Calcium, Ion 1.09 (*)     All other components within normal limits  LACTIC ACID, PLASMA  URINALYSIS, ROUTINE W REFLEX MICROSCOPIC   Ct Abdomen Pelvis W Contrast  10/10/2012  *RADIOLOGY REPORT*  Clinical Data: Evaluate for small bowel obstruction  CT ABDOMEN AND PELVIS WITH CONTRAST  Technique:  Multidetector CT imaging of the abdomen and pelvis was performed following the standard protocol during bolus administration of intravenous contrast.  Contrast: 80mL OMNIPAQUE IOHEXOL 300 MG/ML  SOLN  Comparison: 05/25/2011  Findings:  Lung bases:  There are very small bilateral effusions.  Atelectasis is noted within both lung bases.  No pericardial effusion.  Abdomen/pelvis:  There are multiple low attenuation structures within the liver parenchyma which appear similar to previous exam and likely represents cysts.  Previous:  The cystectomy.  The common bile duct measures 9  mm.  No intrahepatic bile duct dilatation.  The pancreas appears within normal limits.  There is a cyst small low attenuation structure within the spleen measuring 5.4 mm.  Nonspecific but likely benign.  Both adrenal glands are normal.  Nonobstructing right renal calculi are identified measuring up to 2-3 mm.  Several tiny nonobstructing left renal calculi are also noted.  There are several small low attenuation lesions within the left kidney.  Too small reliably characterize.  The urinary bladder appears normal.  There are no enlarged upper abdominal lymph nodes.  No pelvic or inguinal adenopathy identified.  There is a small amount of perihepatic ascites.  Moderate distention of the stomach is noted. The small bowel loops  are increased in caliber measuring up to 3.2 cm.  Transition to collapsed distal small bowel is noted within the right abdomen, the colon is relatively decreased in caliber.  Bones/Musculoskeletal:  Review of the visualized bony structures is significant for mild degenerative disc disease.  IMPRESSION:  1.  Small bowel obstruction with transition point in the right abdomen proximal to the enterocolonic anastomoses. 2.  Status post right hemicolectomy. 3.  Bilateral nonobstructing renal calculi. 4.  Liver cysts 5.  Perihepatic ascites. 6.  Small bilateral effusions with bibasilar atelectasis.   Original Report Authenticated By: Signa Kell, M.D.    Dg Abd Acute W/chest  10/10/2012  *RADIOLOGY REPORT*  Clinical Data: Abdominal pain, nausea, vomiting and blood in stool.  ACUTE ABDOMEN SERIES (ABDOMEN 2 VIEW & CHEST 1 VIEW)  Comparison: Chest radiograph 08/23/2012.  Findings: Frontal view of the chest shows midline trachea and normal heart size.  Pleural parenchymal scarring at the base of the left hemithorax.  Lungs are otherwise clear.  No pleural fluid.  Two views of the abdomen show dilated loops of small bowel with associated air fluid levels.  Stool is seen in the colon.  There are  postoperative changes in the right abdomen.  IMPRESSION: Bowel gas pattern favors a small bowel obstruction.   Original Report Authenticated By: Leanna Battles, M.D.      1. Small bowel obstruction       MDM   Patient with abrupt onset of abdominal pain last night that has persisted all day. He has a lower abdominal scar from an ileo-hemicolectomy due to diverticulosis. He is status post cholecystectomy and appendectomy. The majority of his pain is around his surgical incision site. He denies any urinary symptoms and has no flank pain. The patient. Uncomfortable on exam.  Concern for bowel obstruction versus perforated viscus versus diverticulitis. Lower concern for kidney stone just based on patient's symptoms.  10:05 PM Pt with SBO with transition point.  Will discuss with surgery.  NGT placed. CBC, CMP, lactate, UA, acute abdominal series pending. Patient given IV fluids, pain and nausea medication   7:53 PM Mucus that this is a 13,000. Acute abdominal series with possible small bowel obstruction. Lactate, CMP within normal limits. CT ordered for further evaluation.  10:13 PM CT shows small bowel obstruction with transition point just proximal to the anastomosis. Will discuss with surgery.  Gwyneth Sprout, MD 10/10/12 2206  Gwyneth Sprout, MD 10/10/12 2213

## 2012-10-10 NOTE — ED Notes (Signed)
Pt states blood noted with BM bright  Red.

## 2012-10-10 NOTE — ED Notes (Signed)
Per EMS pt started with abdominal pain and vomiting 1 am today. Pt grimacing, moaning and rubbing generalized abdomin. Pt alert x4 speaks minimal english. respirations easy non labored skin warm and dry.

## 2012-10-10 NOTE — ED Notes (Signed)
In and out cath dark urine noted. Pain medication given pt denies nausea.

## 2012-10-10 NOTE — ED Notes (Signed)
[id number]<AV> Expected date:<BR> Expected time:<BR> Means of arrival:Ambulance<BR> Comments:<BR> EMS- abd pain-vietnamese pt

## 2012-10-11 ENCOUNTER — Encounter (HOSPITAL_COMMUNITY): Payer: Self-pay | Admitting: Surgery

## 2012-10-11 DIAGNOSIS — R109 Unspecified abdominal pain: Secondary | ICD-10-CM

## 2012-10-11 DIAGNOSIS — K56609 Unspecified intestinal obstruction, unspecified as to partial versus complete obstruction: Secondary | ICD-10-CM

## 2012-10-11 MED ORDER — LACTATED RINGERS IV BOLUS (SEPSIS): 1000.0000 mL | Freq: Three times a day (TID) | INTRAVENOUS | Status: AC | PRN

## 2012-10-11 MED ORDER — LACTATED RINGERS IV BOLUS (SEPSIS): 1000.0000 mL | Freq: Once | INTRAVENOUS | Status: DC

## 2012-10-11 MED ORDER — BISACODYL 10 MG RE SUPP
10.0000 mg | Freq: Two times a day (BID) | RECTAL | Status: DC | PRN
  Filled 2012-10-11: qty 1

## 2012-10-11 MED ORDER — PHENOL 1.4 % MT LIQD: 2.0000 | OROMUCOSAL | Status: DC | PRN

## 2012-10-11 MED ORDER — ALUM & MAG HYDROXIDE-SIMETH 200-200-20 MG/5ML PO SUSP
30.0000 mL | Freq: Four times a day (QID) | ORAL | Status: DC | PRN
Start: 2012-10-11 — End: 2012-10-15

## 2012-10-11 MED ORDER — DIPHENHYDRAMINE HCL 50 MG/ML IJ SOLN: 12.5000 mg | Freq: Four times a day (QID) | INTRAMUSCULAR | Status: DC | PRN

## 2012-10-11 MED ORDER — DIPHENHYDRAMINE HCL 12.5 MG/5ML PO ELIX: 12.5000 mg | ORAL_SOLUTION | Freq: Four times a day (QID) | ORAL | Status: DC | PRN

## 2012-10-11 MED ORDER — DEXTROSE IN LACTATED RINGERS 5 % IV SOLN
INTRAVENOUS | Status: DC
  Administered 2012-10-11 – 2012-10-13 (×5): via INTRAVENOUS
  Filled 2012-10-11: qty 1000

## 2012-10-11 MED ORDER — ACETAMINOPHEN 325 MG PO TABS: 650.0000 mg | ORAL_TABLET | Freq: Four times a day (QID) | ORAL | Status: DC | PRN

## 2012-10-11 MED ORDER — ACETAMINOPHEN 650 MG RE SUPP: 650.0000 mg | Freq: Four times a day (QID) | RECTAL | Status: DC | PRN

## 2012-10-11 MED ORDER — PROMETHAZINE HCL 25 MG/ML IJ SOLN
12.5000 mg | Freq: Four times a day (QID) | INTRAMUSCULAR | Status: DC | PRN
  Filled 2012-10-11: qty 1

## 2012-10-11 MED ORDER — HYDROMORPHONE HCL PF 1 MG/ML IJ SOLN
0.5000 mg | INTRAMUSCULAR | Status: DC | PRN
  Administered 2012-10-11 (×2): 1 mg via INTRAVENOUS
  Filled 2012-10-11 (×2): qty 1

## 2012-10-11 MED ORDER — METOPROLOL TARTRATE 1 MG/ML IV SOLN
5.0000 mg | Freq: Four times a day (QID) | INTRAVENOUS | Status: DC | PRN
  Filled 2012-10-11: qty 5

## 2012-10-11 MED ORDER — MAGIC MOUTHWASH
15.0000 mL | Freq: Four times a day (QID) | ORAL | Status: DC | PRN
  Filled 2012-10-11: qty 15

## 2012-10-11 MED ORDER — MENTHOL 3 MG MT LOZG: 1.0000 | LOZENGE | OROMUCOSAL | Status: DC | PRN

## 2012-10-11 MED ORDER — LIP MEDEX EX OINT
1.0000 "application " | TOPICAL_OINTMENT | Freq: Two times a day (BID) | CUTANEOUS | Status: DC
  Administered 2012-10-11 – 2012-10-15 (×7): 1 via TOPICAL
  Filled 2012-10-11 (×3): qty 7

## 2012-10-11 MED ORDER — ONDANSETRON HCL 4 MG/2ML IJ SOLN: 4.0000 mg | Freq: Four times a day (QID) | INTRAMUSCULAR | Status: DC | PRN

## 2012-10-11 MED ORDER — FLEET ENEMA 7-19 GM/118ML RE ENEM
1.0000 | ENEMA | Freq: Once | RECTAL | Status: AC
  Administered 2012-10-11: 1 via RECTAL
  Filled 2012-10-11: qty 1

## 2012-10-11 NOTE — ED Notes (Addendum)
Surgery at bedside.

## 2012-10-11 NOTE — ED Notes (Signed)
Pt vomited large amount of green emesis with surgery at bedside. Surgeon request ng change to 37fr. Pt notified and agreeable. NG place 400 ml green return noted.Pt tolerated well.

## 2012-10-11 NOTE — Care Management Note (Signed)
  Page 1 of 1   10/11/2012     12:09:26 PM   CARE MANAGEMENT NOTE 10/11/2012  Patient:  Eduardo West, Eduardo West   Account Number:  [id number]  Date Initiated:  10/11/2012  Documentation initiated by:  Lorenda Ishihara  Subjective/Objective Assessment:   51 yo male admitted with SBO. PTA lived at home     Action/Plan:   home when stable.   Anticipated DC Date:  10/14/2012   Anticipated DC Plan:  HOME/SELF CARE      DC Planning Services  CM consult      Choice offered to / List presented to:             Status of service:  In process, will continue to follow Medicare Important Message given?   (If response is "NO", the following Medicare IM given date fields will be blank) Date Medicare IM given:   Date Additional Medicare IM given:    Discharge Disposition:    Per UR Regulation:  Reviewed for med. necessity/level of care/duration of stay  If discussed at Long Length of Stay Meetings, dates discussed:    Comments:

## 2012-10-11 NOTE — H&P (Signed)
Eduardo West  March 28, 1961 [id number]   This patient is a 51 y.o.male who presents today for surgical evaluation at the request of Dr. Anitra Lauth, [facility] ED.   Reason for evaluation: Abdominal pain and nausea and vomiting.  Probable bowel obstruction.  Pleasant gentleman originally from [place].  [place] English only fairly.  Has a 24 hour history of worsening nausea and vomiting.  Worsening crampy abdominal pain.  Came emergency room vomiting.  Initially came in with his daughter who acted as an interpreter to the ER physician.  She is not available right now.  He had cecal diverticulitis.  Required emergent open right hemicolectomy and cholecystectomy in 2011 by Dr. Purcell Nails.  Some issues of intermittent crampy abdominal pain and diarrhea since then.  Usually improved on Questran.  Notes no bowel movement for the past day.  Vomited five times.  Very intense pain.  Family was concerned & brought him to the emergency room.  Exam showing significant tenderness.  A nasogastric tube placed.  Pain went down.  He has vomited around the nasogastric tube.  No sick contacts.  No travel history.  No history of prior bowel obstructions.  No hematuria or dark urine.  Urine output down a little.  No hematochezia or melena.    Past Medical History  Diagnosis Date  . Diverticulitis of cecum 12/2009    Resected 2011:  COLON, SEGMENTAL RESECTION, RIGHT AND TERMINAL ILEUM : - SEGMENT OF COLON  . Pleural effusion, left   . Kidney stones   . 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 Hypertension   . Diarrhea   . Abdominal pain   . Buzzing in ear   . Right leg numbness     Past Surgical History  Procedure Date  . Cholecystectomy 12/2009    Dr. Johna Sheriff  . Appendectomy 2003    Dr. Carolynne Edouard:  Attempted laparoscopic and subsequent open appendectomy  . Right colectomy 12/2009    Dr Johna Sheriff: open resection for cecal diverticulitis    History   Social History  . Marital Status: Single    Spouse Name: N/A    Number of Children: N/A  .  Years of Education: N/A   Occupational History  . unemployed    Social History Main Topics  . Smoking status: Former Smoker -- 0.5 packs/day for 20 years    Quit date: 12/01/2007  . Smokeless tobacco: Never Used  . Alcohol Use: No  . Drug Use: No  . Sexually Active: Not on file   Other Topics Concern  . Not on file   Social History Narrative  . No narrative on file    Family History  Problem Relation Age of Onset  . Cancer Mother     Current Facility-Administered Medications  Medication Dose Route Frequency Provider Last Rate Last Dose  . acetaminophen (TYLENOL) tablet 650 mg  650 mg Oral Q6H PRN Ardeth Sportsman, MD       Or  . acetaminophen (TYLENOL) suppository 650 mg  650 mg Rectal Q6H PRN Ardeth Sportsman, MD      . alum & mag hydroxide-simeth (MAALOX/MYLANTA) 200-200-20 MG/5ML suspension 30 mL  30 mL Oral Q6H PRN Ardeth Sportsman, MD      . bisacodyl (DULCOLAX) suppository 10 mg  10 mg Rectal Q12H PRN Ardeth Sportsman, MD      . dextrose 5 % in lactated ringers infusion   Intravenous Continuous Ardeth Sportsman, MD      . diphenhydrAMINE (BENADRYL) injection 12.5-25 mg  12.5-25  mg Intravenous Q6H PRN Ardeth Sportsman, MD       Or  . diphenhydrAMINE (BENADRYL) 12.5 MG/5ML elixir 12.5-25 mg  12.5-25 mg Oral Q6H PRN Ardeth Sportsman, MD      . HYDROmorphone (DILAUDID) injection 0.5-2 mg  0.5-2 mg Intravenous Q2H PRN Ardeth Sportsman, MD      . [COMPLETED] HYDROmorphone (DILAUDID) injection 1 mg  1 mg Intravenous Once Gwyneth Sprout, MD   1 mg at 10/10/12 1812  . [COMPLETED] HYDROmorphone (DILAUDID) injection 1 mg  1 mg Intravenous Once Gwyneth Sprout, MD   1 mg at 10/10/12 2052  . [COMPLETED] HYDROmorphone (DILAUDID) injection 1 mg  1 mg Intravenous Once Gwyneth Sprout, MD   1 mg at 10/10/12 2320  . [COMPLETED] iohexol (OMNIPAQUE) 300 MG/ML solution 80 mL  80 mL Intravenous Once PRN Medication Radiologist, MD   80 mL at 10/10/12 2146  . lactated ringers bolus 1,000 mL  1,000  mL Intravenous Once Ardeth Sportsman, MD      . lactated ringers bolus 1,000 mL  1,000 mL Intravenous Q8H PRN Ardeth Sportsman, MD      . [COMPLETED] lidocaine (XYLOCAINE) 2 % jelly        10 application at 10/10/12 2303  . lip balm (CARMEX) ointment 1 application  1 application Topical BID Ardeth Sportsman, MD      . magic mouthwash  15 mL Oral QID PRN Ardeth Sportsman, MD      . menthol-cetylpyridinium (CEPACOL) lozenge 3 mg  1 lozenge Oral PRN Ardeth Sportsman, MD      . metoprolol (LOPRESSOR) injection 5 mg  5 mg Intravenous Q6H PRN Ardeth Sportsman, MD      . [COMPLETED] ondansetron [facility]) injection 4 mg  4 mg Intravenous Once Gwyneth Sprout, MD   4 mg at 10/10/12 1812  . ondansetron (ZOFRAN) injection 4 mg  4 mg Intravenous Q6H PRN Ardeth Sportsman, MD      . phenol (CHLORASEPTIC) mouth spray 2 spray  2 spray Mouth/Throat PRN Ardeth Sportsman, MD      . promethazine (PHENERGAN) injection 12.5-25 mg  12.5-25 mg Intravenous Q6H PRN Ardeth Sportsman, MD      . [COMPLETED] sodium chloride 0.9 % bolus 1,000 mL  1,000 mL Intravenous Once Gwyneth Sprout, MD   1,000 mL at 10/10/12 1812  . [DISCONTINUED] HYDROmorphone (DILAUDID) injection 1 mg  1 mg Intravenous Once Gwyneth Sprout, MD       Current Outpatient Prescriptions  Medication Sig Dispense Refill  . butalbital-acetaminophen-caffeine (FIORICET, ESGIC) 50-325-40 MG per tablet Take 1 tablet by mouth 2 (two) times daily as needed. migraine      . hydrOXYzine (VISTARIL) 25 MG capsule Take 25 mg by mouth at bedtime.      . naproxen (NAPROSYN) 500 MG tablet Take 500 mg by mouth 2 (two) times daily as needed. Pain      . Tamsulosin HCl (FLOMAX) 0.4 MG CAPS Take 0.4 mg by mouth at bedtime.         No Known Allergies  ROS: Constitutional:  No fevers, chills, sweats.  Weight stable Eyes:  No vision changes, No discharge HENT:  No sore throats, nasal drainage Lymph: No neck swelling, No bruising easily Pulmonary:  No cough, productive  sputum CV: No orthopnea, PND  Patient walks 20 minutes for about 1/4 miles without difficulty.  No exertional chest/neck/shoulder/arm pain. GI:  No personal nor family history of GI/colon cancer, inflammatory bowel  disease, irritable bowel syndrome, allergy such as Celiac Sprue, dietary/dairy problems, colitis, ulcers nor gastritis.  No recent sick contacts/gastroenteritis.  No travel outside the country.  No changes in diet. Renal: No UTIs, No hematuria Genital:  No drainage, bleeding, masses Musculoskeletal: No severe joint pain.  Good ROM major joints Skin:  No sores or lesions.  No rashes Heme/Lymph:  No easy bleeding.  No swollen lymph nodes Neuro: No focal weakness/numbness.  No seizures Psych: No suicidal ideation.  No hallucinations  BP 124/99  Pulse 89  Temp 98.6 F (37 C) (Oral)  Resp 18  SpO2 92%  Physical Exam: General: Pt awake/alert/oriented x4 in mild/mod major acute distress but consolable Eyes: PERRL, normal EOM. Sclera nonicteric Neuro: CN II-XII intact w/o focal sensory/motor deficits. Lymph: No head/neck/groin lymphadenopathy Psych:  No delerium/psychosis/paranoia HENT: Normocephalic, Mucus membranes moist.  No thrush.  NGT in place.  Thick chunky green effluent in canister & on floor - >1 L total Neck: Supple, No tracheal deviation Chest: No pain.  Good respiratory excursion. CV:  Pulses intact.  Regular rhythm Abdomen: Soft, Moderately distended.  Minimally Nontender.  Incision midline well healed.   No hernias. Ext:  SCDs BLE.  No significant edema.  No cyanosis Skin: No petechiae / purpurea.  No major sores Musculoskeletal: No severe joint pain.  Good ROM major joints   Results:   Labs: Results for orders placed during the hospital encounter of 10/10/12 (from the past 48 hour(s))  CBC WITH DIFFERENTIAL     Status: Abnormal   Collection Time   10/10/12  7:00 PM      Component Value Range Comment   WBC 13.2 (*) 4.0 - 10.5 K/uL    RBC 4.31  4.22 - 5.81  MIL/uL    Hemoglobin 13.4  13.0 - 17.0 g/dL    HCT 16.1  09.6 - 04.5 %    MCV 93.7  78.0 - 100.0 fL    MCH 31.1  26.0 - 34.0 pg    MCHC 33.2  30.0 - 36.0 g/dL    RDW 40.9  81.1 - 91.4 %    Platelets 308  150 - 400 K/uL    Neutrophils Relative 89 (*) 43 - 77 %    Neutro Abs 11.8 (*) 1.7 - 7.7 K/uL    Lymphocytes Relative 7 (*) 12 - 46 %    Lymphs Abs 0.9  0.7 - 4.0 K/uL    Monocytes Relative 4  3 - 12 %    Monocytes Absolute 0.5  0.1 - 1.0 K/uL    Eosinophils Relative 0  0 - 5 %    Eosinophils Absolute 0.0  0.0 - 0.7 K/uL    Basophils Relative 0  0 - 1 %    Basophils Absolute 0.0  0.0 - 0.1 K/uL   COMPREHENSIVE METABOLIC PANEL     Status: Abnormal   Collection Time   10/10/12  7:00 PM      Component Value Range Comment   Sodium 137  135 - 145 mEq/L    Potassium 4.0  3.5 - 5.1 mEq/L    Chloride 100  96 - 112 mEq/L    CO2 28  19 - 32 mEq/L    Glucose, Bld 115 (*) 70 - 99 mg/dL    BUN 14  6 - 23 mg/dL    Creatinine, Ser 7.82  0.50 - 1.35 mg/dL    Calcium 8.6  8.4 - 95.6 mg/dL    Total Protein 6.9  6.0 -  8.3 g/dL    Albumin 3.7  3.5 - 5.2 g/dL    AST 43 (*) 0 - 37 U/L    ALT 31  0 - 53 U/L    Alkaline Phosphatase 73  39 - 117 U/L    Total Bilirubin 1.4 (*) 0.3 - 1.2 mg/dL    GFR calc non Af Amer >90  >90 mL/min    GFR calc Af Amer >90  >90 mL/min   LACTIC ACID, PLASMA     Status: Normal   Collection Time   10/10/12  7:00 PM      Component Value Range Comment   Lactic Acid, Venous 1.6  0.5 - 2.2 mmol/L   POCT I-STAT, CHEM 8     Status: Abnormal   Collection Time   10/10/12  7:18 PM      Component Value Range Comment   Sodium 140  135 - 145 mEq/L    Potassium 4.2  3.5 - 5.1 mEq/L    Chloride 102  96 - 112 mEq/L    BUN 16  6 - 23 mg/dL    Creatinine, Ser 4.09  0.50 - 1.35 mg/dL    Glucose, Bld 811 (*) 70 - 99 mg/dL    Calcium, Ion 9.14 (*) 1.12 - 1.23 mmol/L    TCO2 27  0 - 100 mmol/L    Hemoglobin 14.6  13.0 - 17.0 g/dL    HCT 78.2  95.6 - 21.3 %   URINALYSIS,  ROUTINE W REFLEX MICROSCOPIC     Status: Abnormal   Collection Time   10/10/12  9:01 PM      Component Value Range Comment   Color, Urine YELLOW  YELLOW    APPearance CLEAR  CLEAR    Specific Gravity, Urine 1.028  1.005 - 1.030    pH 6.0  5.0 - 8.0    Glucose, UA NEGATIVE  NEGATIVE mg/dL    Hgb urine dipstick NEGATIVE  NEGATIVE    Bilirubin Urine SMALL (*) NEGATIVE    Ketones, ur 15 (*) NEGATIVE mg/dL    Protein, ur NEGATIVE  NEGATIVE mg/dL    Urobilinogen, UA 0.2  0.0 - 1.0 mg/dL    Nitrite NEGATIVE  NEGATIVE    Leukocytes, UA NEGATIVE  NEGATIVE MICROSCOPIC NOT DONE ON URINES WITH NEGATIVE PROTEIN, BLOOD, LEUKOCYTES, NITRITE, OR GLUCOSE <1000 mg/dL.    Imaging / Studies: Ct Abdomen Pelvis W Contrast  10/10/2012  *RADIOLOGY REPORT*  Clinical Data: Evaluate for small bowel obstruction  CT ABDOMEN AND PELVIS WITH CONTRAST  Technique:  Multidetector CT imaging of the abdomen and pelvis was performed following the standard protocol during bolus administration of intravenous contrast.  Contrast: 80mL OMNIPAQUE IOHEXOL 300 MG/ML  SOLN  Comparison: 05/25/2011  Findings:  Lung bases:  There are very small bilateral effusions.  Atelectasis is noted within both lung bases.  No pericardial effusion.  Abdomen/pelvis:  There are multiple low attenuation structures within the liver parenchyma which appear similar to previous exam and likely represents cysts.  Previous:  The cystectomy.  The common bile duct measures 9 mm.  No intrahepatic bile duct dilatation.  The pancreas appears within normal limits.  There is a cyst small low attenuation structure within the spleen measuring 5.4 mm.  Nonspecific but likely benign.  Both adrenal glands are normal.  Nonobstructing right renal calculi are identified measuring up to 2-3 mm.  Several tiny nonobstructing left renal calculi are also noted.  There are several small low attenuation lesions within the  left kidney.  Too small reliably characterize.  The urinary  bladder appears normal.  There are no enlarged upper abdominal lymph nodes.  No pelvic or inguinal adenopathy identified.  There is a small amount of perihepatic ascites.  Moderate distention of the stomach is noted. The small bowel loops are increased in caliber measuring up to 3.2 cm.  Transition to collapsed distal small bowel is noted within the right abdomen, the colon is relatively decreased in caliber.  Bones/Musculoskeletal:  Review of the visualized bony structures is significant for mild degenerative disc disease.  IMPRESSION:  1.  Small bowel obstruction with transition point in the right abdomen proximal to the enterocolonic anastomoses. 2.  Status post right hemicolectomy. 3.  Bilateral nonobstructing renal calculi. 4.  Liver cysts 5.  Perihepatic ascites. 6.  Small bilateral effusions with bibasilar atelectasis.   Original Report Authenticated By: Signa Kell, M.D.    Dg Abd Acute W/chest  10/10/2012  *RADIOLOGY REPORT*  Clinical Data: Abdominal pain, nausea, vomiting and blood in stool.  ACUTE ABDOMEN SERIES (ABDOMEN 2 VIEW & CHEST 1 VIEW)  Comparison: Chest radiograph 08/23/2012.  Findings: Frontal view of the chest shows midline trachea and normal heart size.  Pleural parenchymal scarring at the base of the left hemithorax.  Lungs are otherwise clear.  No pleural fluid.  Two views of the abdomen show dilated loops of small bowel with associated air fluid levels.  Stool is seen in the colon.  There are postoperative changes in the right abdomen.  IMPRESSION: Bowel gas pattern favors a small bowel obstruction.   Original Report Authenticated By: Leanna Battles, M.D.     Medications / Allergies: per chart  Antibiotics: Anti-infectives    None      Assessment  Daksh Stockley  51 y.o. male       Problem List:  Principal Problem:  *SBO (small bowel obstruction) Active Problems:  Chronic abdominal pain   SBO by H&P & CT scan  Plan:  Admit  IV fluids  Nasogastric tube  decompression.  Up size to 18-gauge if vomiting persists  Serial examinations  Followup x-rays to see if Enteral contrast and gas goes into colon.  However, most of contrast was in stomach and I think he vomited most of that up.  If she worsens or does not improve, abdominal reexploration/lysis of adhesions.  -VTE prophylaxis- SCDs, etc -mobilize as tolerated to help recovery    Ardeth Sportsman, M.D., F.A.C.S. Gastrointestinal and Minimally Invasive Surgery [facility] [address] [place], [place] [address] [phone] Main / Paging [phone] Voice Mail   10/11/2012  CARE TEAM:  PCP: Jearld Lesch, MD  Outpatient Care Team: Patient Care Team: Jearld Lesch as PCP - General (Specialist)  Inpatient Treatment Team: Treatment Team: Registered Nurse: Volney Presser, RN; Technician: Serina Cowper, NT; Consulting Physician: Bishop Limbo, MD

## 2012-10-11 NOTE — Progress Notes (Signed)
Subjective: Still with some abdominal discomfort this morning, no further episodes of N/V since NG was placed which is draining bilious fluid. ( so far)  Objective: Vital signs in last 24 hours: Temp:  [97.9 F (36.6 C)-98.6 F (37 C)] 98 F (36.7 C) (11/12 0615) Pulse Rate:  [74-96] 89  (11/12 0615) Resp:  [18] 18  (11/12 0615) BP: (118-155)/(85-102) 131/90 mmHg (11/12 0615) SpO2:  [90 %-99 %] 97 % (11/12 0615) Weight:  [147 lb (66.679 kg)] 147 lb (66.679 kg) (11/12 0258) Last BM Date: 10/09/12  Intake/Output from previous day: 11/11 0701 - 11/12 0700 In: 1000 [I.V.:1000] Out: 1375 [Urine:600; Emesis/NG output:575] Intake/Output this shift:    General appearance: alert, cooperative, appears stated age and no distress Chest: CTA bilaterally Cardiac: RRR No M/R/G Abdomen: old surgical scar's noted well healed, soft, diffusely tender, No BS, NG in place and functioning draining bilious liquid. Extremities: warm to touch, no edema or tenderness, + pulses.  Lab Results:   Basename 10/10/12 1918 10/10/12 1900  WBC -- 13.2*  HGB 14.6 13.4  HCT 43.0 40.4  PLT -- 308   BMET  Basename 10/10/12 1918 10/10/12 1900  NA 140 137  K 4.2 4.0  CL 102 100  CO2 -- 28  GLUCOSE 111* 115*  BUN 16 14  CREATININE 1.00 0.89  CALCIUM -- 8.6   PT/INR No results found for this basename: LABPROT:2,INR:2 in the last 72 hours ABG No results found for this basename: PHART:2,PCO2:2,PO2:2,HCO3:2 in the last 72 hours  Studies/Results: Ct Abdomen Pelvis W Contrast  10/10/2012  *RADIOLOGY REPORT*  Clinical Data: Evaluate for small bowel obstruction  CT ABDOMEN AND PELVIS WITH CONTRAST  Technique:  Multidetector CT imaging of the abdomen and pelvis was performed following the standard protocol during bolus administration of intravenous contrast.  Contrast: 80mL OMNIPAQUE IOHEXOL 300 MG/ML  SOLN  Comparison: 05/25/2011  Findings:  Lung bases:  There are very small bilateral effusions.   Atelectasis is noted within both lung bases.  No pericardial effusion.  Abdomen/pelvis:  There are multiple low attenuation structures within the liver parenchyma which appear similar to previous exam and likely represents cysts.  Previous:  The cystectomy.  The common bile duct measures 9 mm.  No intrahepatic bile duct dilatation.  The pancreas appears within normal limits.  There is a cyst small low attenuation structure within the spleen measuring 5.4 mm.  Nonspecific but likely benign.  Both adrenal glands are normal.  Nonobstructing right renal calculi are identified measuring up to 2-3 mm.  Several tiny nonobstructing left renal calculi are also noted.  There are several small low attenuation lesions within the left kidney.  Too small reliably characterize.  The urinary bladder appears normal.  There are no enlarged upper abdominal lymph nodes.  No pelvic or inguinal adenopathy identified.  There is a small amount of perihepatic ascites.  Moderate distention of the stomach is noted. The small bowel loops are increased in caliber measuring up to 3.2 cm.  Transition to collapsed distal small bowel is noted within the right abdomen, the colon is relatively decreased in caliber.  Bones/Musculoskeletal:  Review of the visualized bony structures is significant for mild degenerative disc disease.  IMPRESSION:  1.  Small bowel obstruction with transition point in the right abdomen proximal to the enterocolonic anastomoses. 2.  Status post right hemicolectomy. 3.  Bilateral nonobstructing renal calculi. 4.  Liver cysts 5.  Perihepatic ascites. 6.  Small bilateral effusions with bibasilar atelectasis.   Original  Report Authenticated By: Signa Kell, M.D.    Dg Abd Acute W/chest  10/10/2012  *RADIOLOGY REPORT*  Clinical Data: Abdominal pain, nausea, vomiting and blood in stool.  ACUTE ABDOMEN SERIES (ABDOMEN 2 VIEW & CHEST 1 VIEW)  Comparison: Chest radiograph 08/23/2012.  Findings: Frontal view of the chest shows  midline trachea and normal heart size.  Pleural parenchymal scarring at the base of the left hemithorax.  Lungs are otherwise clear.  No pleural fluid.  Two views of the abdomen show dilated loops of small bowel with associated air fluid levels.  Stool is seen in the colon.  There are postoperative changes in the right abdomen.  IMPRESSION: Bowel gas pattern favors a small bowel obstruction.   Original Report Authenticated By: Leanna Battles, M.D.     Anti-infectives: Anti-infectives    None      Assessment/Plan:  Patient Active Problem List  Diagnosis  . PLEURAL EFFUSION  . DYSPNEA  . Positive TB test  . Back pain  . Chest pain  . Chronic abdominal pain  . Chronic diarrhea  . SBO (small bowel obstruction)  s/p * No surgery found *  Continue with conservative management for now (NPO, IVF) Recheck abd films in a day or two. Pain management DVT and GERD prophylaxis OOB/Ambulate Recheck labs in am   LOS: 1 day    Blenda Mounts [facility] Pager # [phone]  10/11/2012

## 2012-10-11 NOTE — ED Notes (Signed)
Report called to [facility] RN pt ready for transport.

## 2012-10-11 NOTE — Progress Notes (Signed)
I have seen and examined the patient and agree with the assessment and plans. Continue NPO and NG  Katherin Ramey A. Keshaun Dubey  MD, FACS  

## 2012-10-12 ENCOUNTER — Inpatient Hospital Stay (HOSPITAL_COMMUNITY): Payer: Medicaid Other

## 2012-10-12 LAB — CBC
HCT: 38.4 % — ABNORMAL LOW (ref 39.0–52.0)
Hemoglobin: 12.6 g/dL — ABNORMAL LOW (ref 13.0–17.0)
MCH: 30.8 pg (ref 26.0–34.0)
MCHC: 32.8 g/dL (ref 30.0–36.0)
RBC: 4.09 MIL/uL — ABNORMAL LOW (ref 4.22–5.81)

## 2012-10-12 LAB — BASIC METABOLIC PANEL
BUN: 8 mg/dL (ref 6–23)
CO2: 31 mEq/L (ref 19–32)
Chloride: 101 mEq/L (ref 96–112)
Glucose, Bld: 116 mg/dL — ABNORMAL HIGH (ref 70–99)
Potassium: 2.8 mEq/L — ABNORMAL LOW (ref 3.5–5.1)

## 2012-10-12 MED ORDER — GLYCERIN (LAXATIVE) 2.1 G RE SUPP
1.0000 | Freq: Once | RECTAL | Status: AC
  Administered 2012-10-12: 1 via RECTAL
  Filled 2012-10-12: qty 1

## 2012-10-12 MED ORDER — POTASSIUM CHLORIDE 10 MEQ/100ML IV SOLN
10.0000 meq | INTRAVENOUS | Status: AC
  Administered 2012-10-12 (×5): 10 meq via INTRAVENOUS
  Filled 2012-10-12 (×5): qty 100

## 2012-10-12 NOTE — Progress Notes (Signed)
Patient ID: Eduardo West, male   DOB: [birth year], 51 y.o.   MRN: [id number]    Subjective: Still with some abdominal discomfort this morning, no further episodes of N/V since NG was placed which is draining bilious fluid. ( so far)  Objective: Vital signs in last 24 hours: Temp:  [98.1 F (36.7 C)-98.8 F (37.1 C)] 98.7 F (37.1 C) (11/13 0619) Pulse Rate:  [80-91] 80  (11/13 0619) Resp:  [18] 18  (11/13 0619) BP: (122-131)/(71-87) 122/75 mmHg (11/13 0619) SpO2:  [95 %-97 %] 96 % (11/13 0619) Last BM Date: 10/11/12  Intake/Output from previous day: 11/12 0701 - 11/13 0700 In: 2508.3 [P.O.:120; I.V.:2388.3] Out: 1875 [Urine:1675; Emesis/NG output:200] Intake/Output this shift: Total I/O In: -  Out: 150 [Emesis/NG output:150]  General appearance: alert, cooperative, appears stated age and no distress Chest: CTA bilaterally Cardiac: RRR No M/R/G Abdomen: old surgical scar's noted well healed, soft, diffusely tender, No BS, NG in place and functioning draining bilious liquid. ( ) Abdominal xray done 10/12/12: Findings: Nasogastric tube has been placed, projecting into the  subhepatic space. This corresponds with the location of the  gastric pylorus on CT. The small bowel distension has resolved.  There are bowel anastomosis clips in the right mid abdomen and  cholecystectomy clips. There is no evidence of free  intraperitoneal air or suspicious abdominal calcification.  IMPRESSION:  Decompressed small bowel following nasogastric tube placement. No  evidence of perforation.  Original Report Authenticated By: Carey Bullocks, M.D.  Extremities: warm to touch, no edema or tenderness, + pulses. Labs: Hypokalemia (repleting), wbc wnl.    Lab Results:   Basename 10/12/12 0402 10/10/12 1918 10/10/12 1900  WBC 8.3 -- 13.2*  HGB 12.6* 14.6 --  HCT 38.4* 43.0 --  PLT 276 -- 308   BMET  Basename 10/12/12 0402 10/10/12 1918 10/10/12 1900  NA 138 140 --  K 2.8* 4.2 --    CL 101 102 --  CO2 31 -- 28  GLUCOSE 116* 111* --  BUN 8 16 --  CREATININE 0.77 1.00 --  CALCIUM 8.5 -- 8.6   PT/INR No results found for this basename: LABPROT:2,INR:2 in the last 72 hours ABG No results found for this basename: PHART:2,PCO2:2,PO2:2,HCO3:2 in the last 72 hours  Studies/Results: Dg Abd 1 View  10/12/2012  *RADIOLOGY REPORT*  Clinical Data: Small bowel obstruction.  The patient reports improved symptoms.  ABDOMEN - 1 VIEW  Comparison: CT and radiographs 10/10/2012.  Findings: Nasogastric tube has been placed, projecting into the subhepatic space.  This corresponds with the location of the gastric pylorus on CT.  The small bowel distension has resolved. There are bowel anastomosis clips in the right mid abdomen and cholecystectomy clips.  There is no evidence of free intraperitoneal air or suspicious abdominal calcification.  IMPRESSION: Decompressed small bowel following nasogastric tube placement.  No evidence of perforation.   Original Report Authenticated By: Carey Bullocks, M.D.    Ct Abdomen Pelvis W Contrast  10/10/2012  *RADIOLOGY REPORT*  Clinical Data: Evaluate for small bowel obstruction  CT ABDOMEN AND PELVIS WITH CONTRAST  Technique:  Multidetector CT imaging of the abdomen and pelvis was performed following the standard protocol during bolus administration of intravenous contrast.  Contrast: 80mL OMNIPAQUE IOHEXOL 300 MG/ML  SOLN  Comparison: 05/25/2011  Findings:  Lung bases:  There are very small bilateral effusions.  Atelectasis is noted within both lung bases.  No pericardial effusion.  Abdomen/pelvis:  There are multiple low attenuation structures within the liver parenchyma  which appear similar to previous exam and likely represents cysts.  Previous:  The cystectomy.  The common bile duct measures 9 mm.  No intrahepatic bile duct dilatation.  The pancreas appears within normal limits.  There is a cyst small low attenuation structure within the spleen measuring  5.4 mm.  Nonspecific but likely benign.  Both adrenal glands are normal.  Nonobstructing right renal calculi are identified measuring up to 2-3 mm.  Several tiny nonobstructing left renal calculi are also noted.  There are several small low attenuation lesions within the left kidney.  Too small reliably characterize.  The urinary bladder appears normal.  There are no enlarged upper abdominal lymph nodes.  No pelvic or inguinal adenopathy identified.  There is a small amount of perihepatic ascites.  Moderate distention of the stomach is noted. The small bowel loops are increased in caliber measuring up to 3.2 cm.  Transition to collapsed distal small bowel is noted within the right abdomen, the colon is relatively decreased in caliber.  Bones/Musculoskeletal:  Review of the visualized bony structures is significant for mild degenerative disc disease.  IMPRESSION:  1.  Small bowel obstruction with transition point in the right abdomen proximal to the enterocolonic anastomoses. 2.  Status post right hemicolectomy. 3.  Bilateral nonobstructing renal calculi. 4.  Liver cysts 5.  Perihepatic ascites. 6.  Small bilateral effusions with bibasilar atelectasis.   Original Report Authenticated By: Signa Kell, M.D.    Dg Abd Acute W/chest  10/10/2012  *RADIOLOGY REPORT*  Clinical Data: Abdominal pain, nausea, vomiting and blood in stool.  ACUTE ABDOMEN SERIES (ABDOMEN 2 VIEW & CHEST 1 VIEW)  Comparison: Chest radiograph 08/23/2012.  Findings: Frontal view of the chest shows midline trachea and normal heart size.  Pleural parenchymal scarring at the base of the left hemithorax.  Lungs are otherwise clear.  No pleural fluid.  Two views of the abdomen show dilated loops of small bowel with associated air fluid levels.  Stool is seen in the colon.  There are postoperative changes in the right abdomen.  IMPRESSION: Bowel gas pattern favors a small bowel obstruction.   Original Report Authenticated By: Leanna Battles, M.D.      Anti-infectives: Anti-infectives    None      Assessment/Plan:  Patient Active Problem List  Diagnosis  . PLEURAL EFFUSION  . DYSPNEA  . Positive TB test  . Back pain  . Chest pain  . Chronic abdominal pain  . Chronic diarrhea  . SBO (small bowel obstruction)  s/p * No surgery found * SBO slowly resolving Will add colace to help clear contrast Continue with conservative management for now (NPO, IVF) Hypokalemia (secondary to NG tube) Repleting K+ will recheck labs in am. Pain management DVT and GERD prophylaxis OOB/Ambulate    LOS: 2 days    Golda Acre [facility] Surgery Pager # [phone]  10/12/2012

## 2012-10-12 NOTE — Progress Notes (Signed)
I have seen and examined the patient and agree with the assessment and plans. Keep NG Try suppos Ervine Witucki A. Magnus Ivan  MD, FACS

## 2012-10-13 LAB — BASIC METABOLIC PANEL
BUN: 7 mg/dL (ref 6–23)
CO2: 34 mEq/L — ABNORMAL HIGH (ref 19–32)
Calcium: 9.6 mg/dL (ref 8.4–10.5)
GFR calc non Af Amer: >90 mL/min (ref >90)
Glucose, Bld: 130 mg/dL — ABNORMAL HIGH (ref 70–99)

## 2012-10-13 MED ORDER — POTASSIUM CHLORIDE 10 MEQ/100ML IV SOLN
10.0000 meq | INTRAVENOUS | Status: AC
  Administered 2012-10-13 (×3): 10 meq via INTRAVENOUS
  Filled 2012-10-13 (×5): qty 100

## 2012-10-13 MED ORDER — POTASSIUM CHLORIDE 2 MEQ/ML IV SOLN
INTRAVENOUS | Status: AC
  Administered 2012-10-13: 20:00:00 via INTRAVENOUS
  Filled 2012-10-13: qty 1000

## 2012-10-13 MED ORDER — KCL IN DEXTROSE-NACL 20-5-0.9 MEQ/L-%-% IV SOLN
INTRAVENOUS | Status: DC
  Administered 2012-10-13 – 2012-10-14 (×2): via INTRAVENOUS
  Filled 2012-10-13 (×5): qty 1000

## 2012-10-13 NOTE — Progress Notes (Signed)
Patient ID: Jupiter Kabir, male   DOB: Jul 25, 1961, 51 y.o.   MRN: [id number] Patient ID: Averi Cacioppo, male   DOB: 02/09/1961, 51 y.o.   MRN: [id number]    Subjective: No abdominal pain this morning; NG output appears up but patient has been tajking in considerable amounts of ice chips so this is scewing totals. Output appears very diluted bilious. ( ) I have asked nursing to cut way back on ice chips so that we can get better read on actual output.  Objective: Vital signs in last 24 hours: Temp:  [97.2 F (36.2 C)-98.3 F (36.8 C)] 97.2 F (36.2 C) (11/14 0746) Pulse Rate:  [60-89] 61  (11/14 0746) Resp:  [14-18] 14  (11/14 0746) BP: (110-145)/(77-96) 135/92 mmHg (11/14 0746) SpO2:  [96 %-97 %] 96 % (11/14 0746) Last BM Date: 10/12/12  Intake/Output from previous day: 11/13 0701 - 11/14 0700 In: 2168.3 [I.V.:1868.3; IV Piggyback:300] Out: 4900 [Urine:1650; Emesis/NG output:3250] Intake/Output this shift: Total I/O In: 120 [P.O.:120] Out: -   General appearance: alert, cooperative, appears stated age and no distress Chest: CTA bilaterally Cardiac: RRR No M/R/G Abdomen: old surgical scar's noted well healed, soft, non tender, No BS, NG in place and functioning draining diluted appearing bilious liquid. ( ) this total likely represents a lot of ice. + BM with dulcolax suppository. Extremities: warm to touch, no edema or tenderness, + pulses. Labs: pending    Lab Results:   Basename 10/12/12 0402 10/10/12 1918 10/10/12 1900  WBC 8.3 -- 13.2*  HGB 12.6* 14.6 --  HCT 38.4* 43.0 --  PLT 276 -- 308   BMET  Basename 10/12/12 0402 10/10/12 1918 10/10/12 1900  NA 138 140 --  K 2.8* 4.2 --  CL 101 102 --  CO2 31 -- 28  GLUCOSE 116* 111* --  BUN 8 16 --  CREATININE 0.77 1.00 --  CALCIUM 8.5 -- 8.6   PT/INR No results found for this basename: LABPROT:2,INR:2 in the last 72 hours ABG No results found for this basename: PHART:2,PCO2:2,PO2:2,HCO3:2 in the last 72  hours  Studies/Results: Dg Abd 1 View  10/12/2012  *RADIOLOGY REPORT*  Clinical Data: Small bowel obstruction.  The patient reports improved symptoms.  ABDOMEN - 1 VIEW  Comparison: CT and radiographs 10/10/2012.  Findings: Nasogastric tube has been placed, projecting into the subhepatic space.  This corresponds with the location of the gastric pylorus on CT.  The small bowel distension has resolved. There are bowel anastomosis clips in the right mid abdomen and cholecystectomy clips.  There is no evidence of free intraperitoneal air or suspicious abdominal calcification.  IMPRESSION: Decompressed small bowel following nasogastric tube placement.  No evidence of perforation.   Original Report Authenticated By: Carey Bullocks, M.D.     Anti-infectives: Anti-infectives    None      Assessment/Plan:  Patient Active Problem List  Diagnosis  . PLEURAL EFFUSION  . DYSPNEA  . Positive TB test  . Back pain  . Chest pain  . Chronic abdominal pain  . Chronic diarrhea  . SBO (small bowel obstruction)  s/p * No surgery found * SBO slowly resolving. (? Able to clamp NG soon will discuss with Dr. Magnus Ivan) Continue with conservative management for now (NPO, IVF) will recheck labs this am Pain management DVT and GERD prophylaxis OOB/Ambulate Upon discharge patient will f/u with Dr. Adriana Mccallum.   LOS: 3 days    Golda Acre [facility] Surgery Pager # [phone]  10/13/2012

## 2012-10-13 NOTE — Progress Notes (Signed)
Dr. Maisie Fus aware via phone pt's IV site in RAFA d/c'd due to red streaking and pain at site. Pt was currently receiving potassium runs. Ice packed applied to dc'd site. See new orders received from Md.

## 2012-10-13 NOTE — Progress Notes (Signed)
I have seen and examined the patient and agree with the assessment and plans.  Gust Eugene A. Raileigh Sabater  MD, FACS  

## 2012-10-14 LAB — BASIC METABOLIC PANEL
CO2: 27 mEq/L (ref 19–32)
Calcium: 9.5 mg/dL (ref 8.4–10.5)
Creatinine, Ser: 0.81 mg/dL (ref 0.50–1.35)
GFR calc Af Amer: >90 mL/min (ref >90)

## 2012-10-14 NOTE — Progress Notes (Signed)
Patient ID: Eduardo West, male   DOB: November 12, 1961, 51 y.o.   MRN: [id number] Patient ID: Eduardo West, male   DOB: 02-Mar-1961, 51 y.o.   MRN: [id number] Patient ID: Eduardo West, male   DOB: Feb 07, 1961, 51 y.o.   MRN: [id number]    Subjective: No abdominal pain this morning; NG has been clamped overnight, no N/V/D.  Objective: Vital signs in last 24 hours: Temp:  [97.9 F (36.6 C)-99.3 F (37.4 C)] 99.3 F (37.4 C) (11/15 0558) Pulse Rate:  [74-96] 74  (11/15 0558) Resp:  [16-18] 16  (11/15 0558) BP: (126-145)/(87-97) 136/92 mmHg (11/15 0558) SpO2:  [95 %-96 %] 96 % (11/15 0558) Last BM Date: 10/12/12  Intake/Output from previous day: 11/14 0701 - 11/15 0700 In: 2288.8 [P.O.:120; I.V.:1962.5] Out: 750 [Urine:400; Emesis/NG output:350] Intake/Output this shift:    General appearance: alert, cooperative, appears stated age and no distress Chest: CTA bilaterally Cardiac: RRR No M/R/G Abdomen: old surgical scar's noted well healed, soft, non tender, No BS, NG in place and clamped.+ BM and BS. Extremities: warm to touch, no edema or tenderness, + pulses. VSS, afebrile. Labs:BMP wnl   Lab Results:   [facility] 10/12/12 0402  WBC 8.3  HGB 12.6*  HCT 38.4*  PLT 276   BMET  Basename 10/14/12 0402 10/13/12 0913  NA 138 138  K 3.8 2.9*  CL 102 95*  CO2 27 34*  GLUCOSE 111* 130*  BUN 9 7  CREATININE 0.81 0.85  CALCIUM 9.5 9.6   PT/INR No results found for this basename: LABPROT:2,INR:2 in the last 72 hours ABG No results found for this basename: PHART:2,PCO2:2,PO2:2,HCO3:2 in the last 72 hours  Studies/Results: Dg Abd 1 View  10/12/2012  *RADIOLOGY REPORT*  Clinical Data: Small bowel obstruction.  The patient reports improved symptoms.  ABDOMEN - 1 VIEW  Comparison: CT and radiographs 10/10/2012.  Findings: Nasogastric tube has been placed, projecting into the subhepatic space.  This corresponds with the location of the gastric pylorus on CT.  The small bowel distension has  resolved. There are bowel anastomosis clips in the right mid abdomen and cholecystectomy clips.  There is no evidence of free intraperitoneal air or suspicious abdominal calcification.  IMPRESSION: Decompressed small bowel following nasogastric tube placement.  No evidence of perforation.   Original Report Authenticated By: Carey Bullocks, M.D.     Anti-infectives: Anti-infectives    None      Assessment/Plan:  Patient Active Problem List  Diagnosis  . PLEURAL EFFUSION  . DYSPNEA  . Positive TB test  . Back pain  . Chest pain  . Chronic abdominal pain  . Chronic diarrhea  . SBO (small bowel obstruction)  s/p * No surgery found * SBO slowly resolving. Will allow sips of clears. Pain management DVT and GERD prophylaxis OOB/Ambulate Upon discharge patient will f/u with Dr. Adriana Mccallum.   LOS: 4 days    Golda Acre [facility] Surgery Pager # [phone]  10/14/2012

## 2012-10-14 NOTE — Progress Notes (Signed)
I have seen and examined the patient and agree with the assessment and plans. Will start po.  Hopefully will continue to improve  Hasna Stefanik A. Magnus Ivan  MD, FACS

## 2012-10-14 NOTE — Progress Notes (Signed)
[id number]/Daena Alper, RN, BSN, CCM: CHART REVIEWED AND UPDATED.  Next Review due on [id number]. NO DISCHARGE NEEDS PRESENT AT THIS TIME. CASE MANAGEMENT [phone] 

## 2012-10-15 NOTE — Discharge Summary (Signed)
Physician Discharge Summary  Patient ID: Neng Albee MRN: [id number] DOB/AGE: 1961-04-06 51 y.o.  Admit date: 10/10/2012 Discharge date: 10/15/2012  Admission Diagnoses: partial small bowel obstruction  Discharge Diagnoses:  Principal Problem:  *SBO (small bowel obstruction) Active Problems:  Chronic abdominal pain   Discharged Condition: good  Hospital Course: The patient was admitted for a partial small bowel obstruction.  He was treated with conservative management and it resolved.  He was discharge home once he was able to tolerate a diet.    Consults: None  Significant Diagnostic Studies: labs: CBC, chemistry and radiology: KUB: resolving and CT scan: partial small bowel obstruction  Treatments: IV hydration  Discharge Exam: Blood pressure 125/87, pulse 84, temperature 97.6 F (36.4 C), temperature source Oral, resp. rate 18, height 5\' 3"  (1.6 m), weight 147 lb (66.679 kg), SpO2 97.00%. General appearance: alert and cooperative Cardio: regular rate and rhythm GI: soft, non-tender; bowel sounds normal; no masses,  no organomegaly  Disposition:      Medication List     As of 10/15/2012 10:34 AM    ASK your doctor about these medications         butalbital-acetaminophen-caffeine 50-325-40 MG per tablet   Commonly known as: FIORICET, ESGIC   Take 1 tablet by mouth 2 (two) times daily as needed. migraine      hydrOXYzine 25 MG capsule   Commonly known as: VISTARIL   Take 25 mg by mouth at bedtime.      naproxen 500 MG tablet   Commonly known as: NAPROSYN   Take 500 mg by mouth 2 (two) times daily as needed. Pain      Tamsulosin HCl 0.4 MG Caps   Commonly known as: FLOMAX   Take 0.4 mg by mouth at bedtime.         Signed: Nylan Nakatani C. 10/15/2012, 10:34 AM

## 2012-10-21 ENCOUNTER — Ambulatory Visit (INDEPENDENT_AMBULATORY_CARE_PROVIDER_SITE_OTHER): Payer: Medicaid Other | Admitting: General Surgery

## 2012-10-21 ENCOUNTER — Ambulatory Visit
Admission: RE | Admit: 2012-10-21 | Discharge: 2012-10-21 | Disposition: A | Discharge status: Home / Self Care | Payer: Medicaid Other | Source: Ambulatory Visit | Attending: General Surgery | Admitting: General Surgery

## 2012-10-21 ENCOUNTER — Encounter (INDEPENDENT_AMBULATORY_CARE_PROVIDER_SITE_OTHER): Payer: Self-pay | Admitting: General Surgery

## 2012-10-21 VITALS — BP 128/76 | HR 72 | Temp 98.3°F | Resp 16 | Ht 64.0 in | Wt 141.4 lb

## 2012-10-21 DIAGNOSIS — Z8719 Personal history of other diseases of the digestive system: Secondary | ICD-10-CM

## 2012-10-21 DIAGNOSIS — R109 Unspecified abdominal pain: Secondary | ICD-10-CM

## 2012-10-21 NOTE — Progress Notes (Signed)
Chief complaint: Abdominal pain and followup for hospitalization for small bowel obstruction  History: The patient returns to the office for scheduled followup. He was hospitalized on November 11 with acutely worsening abdominal pain and vomiting. Plain abdominal x-rays and CT showed evidence of small bowel obstruction. He was treated with NG suction and bowel rest and his x-rays quickly normalized. He was able to tolerate a diet and was discharged home several days later. 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 History is complicated by a language barrier but it appears that he is eating without nausea or vomiting although appetite is not good. Bowels are moving okay. He has some rolling or cramping occasional abdominal pain in his upper abdomen which she states is different than his chronic lower abdominal pain.  Exam: BP 128/76  Pulse 72  Temp 98.3 F (36.8 C) (Temporal)  Resp 16  Ht 5\' 4"  (1.626 m)  Wt 141 lb 6 oz (64.127 kg)  BMI 24.27 kg/m2 General: He is alert and does not appear ill. Abdomen: Nondistended. Bowel sounds are normoactive. Incisions well healed without hernias. Soft and no apparent tenderness.  Assessment and plan: Status post recent hospitalization for small bowel obstruction with history of ileocecectomy and chronic abdominal pain. I do not see evidence of ongoing obstruction but he is having some crampy discomfort and on going to repeat a KUB. As long as this looks okay I think expectant management is very reasonable as he is eating and bowels are moving. I will see him back in 6 weeks for more long-term followup.

## 2012-12-09 ENCOUNTER — Encounter (INDEPENDENT_AMBULATORY_CARE_PROVIDER_SITE_OTHER): Payer: Self-pay | Admitting: General Surgery

## 2012-12-09 ENCOUNTER — Ambulatory Visit (INDEPENDENT_AMBULATORY_CARE_PROVIDER_SITE_OTHER): Payer: Medicaid Other | Admitting: General Surgery

## 2012-12-09 VITALS — BP 142/88 | HR 76 | Temp 97.6°F | Resp 16 | Ht 63.0 in | Wt 141.8 lb

## 2012-12-09 DIAGNOSIS — R109 Unspecified abdominal pain: Secondary | ICD-10-CM

## 2012-12-09 NOTE — Progress Notes (Signed)
History: Patient returns for long-term followup. He has a history of ileal colectomy for perforated diverticulitis in 2011 as well as cholecystectomy with gallstones. He has chronic abdominal pain and diarrhea. He however was hospitalized with an episode of small bowel obstruction in November. This resolved nonoperatively. He returns for more long-term followup. History is limited somewhat due to language barrier but through a translator he continues to have abdominal discomfort which he has essentially had since 2011. He has diarrhea that is reasonably well-controlled with Questran. He does not appear to be having any of the symptoms he was having a bowel obstruction. No vomiting.  Exam: BP 142/88  Pulse 76  Temp 97.6 F (36.4 C) (Temporal)  Resp 16  Ht 5\' 3"  (1.6 m)  Wt 141 lb 12.8 oz (64.32 kg)  BMI 25.12 kg/m2 General: Does not appear ill Abdomen: Mild diffuse tenderness. No distention. Active bowel sounds. Healed midline incision without hernias.  KUB was repeated since his last visit which is negative without evidence of obstruction  Assessment and plan: Episode of small bowel obstruction which has resolved. He has chronic abdominal complaints which have been difficult but these do not appear to be changed. Will continue to use Questran when necessary. He'll return as needed.

## 2013-07-03 ENCOUNTER — Encounter: Payer: Self-pay | Admitting: Gastroenterology

## 2013-07-14 ENCOUNTER — Encounter: Payer: Self-pay | Admitting: *Deleted

## 2013-07-20 ENCOUNTER — Ambulatory Visit (INDEPENDENT_AMBULATORY_CARE_PROVIDER_SITE_OTHER): Payer: Medicaid Other | Admitting: Gastroenterology

## 2013-07-20 ENCOUNTER — Emergency Department (HOSPITAL_COMMUNITY)
Admission: EM | Admit: 2013-07-20 | Discharge: 2013-07-20 | Discharge status: Against Medical Advice | Payer: Medicaid Other

## 2013-07-20 ENCOUNTER — Encounter: Payer: Self-pay | Admitting: Gastroenterology

## 2013-07-20 VITALS — BP 120/82 | HR 76 | Ht 63.0 in | Wt 148.4 lb

## 2013-07-20 DIAGNOSIS — K5909 Other constipation: Secondary | ICD-10-CM

## 2013-07-20 DIAGNOSIS — K565 Intestinal adhesions [bands], unspecified as to partial versus complete obstruction: Secondary | ICD-10-CM

## 2013-07-20 DIAGNOSIS — Z1211 Encounter for screening for malignant neoplasm of colon: Secondary | ICD-10-CM

## 2013-07-20 DIAGNOSIS — Z9889 Other specified postprocedural states: Secondary | ICD-10-CM

## 2013-07-20 DIAGNOSIS — Z9049 Acquired absence of other specified parts of digestive tract: Secondary | ICD-10-CM

## 2013-07-20 DIAGNOSIS — R1031 Right lower quadrant pain: Secondary | ICD-10-CM

## 2013-07-20 MED ORDER — LINACLOTIDE 145 MCG PO CAPS: 145.0000 ug | ORAL_CAPSULE | Freq: Every day | ORAL | Status: DC

## 2013-07-20 NOTE — Patient Instructions (Signed)
Your physician has requested that you go to the basement for the following lab work before leaving today: IFOB  We have given you samples of the following medication to take: Linzess 145 mcg, please take one capsule by mouth once daily  Please purchase Miralax over the counter. Take one capful and dump into eight ounces of water and drink.  Please purchase Benefiber over the counter and take daily as directed.  Please follow High Fiber Diet, information given today.  ____________________________________________________________________________________________________

## 2013-07-20 NOTE — Progress Notes (Addendum)
History of Present Illness:  This is a 52year-old Vietnamese male referred from Dr. Lerry Liner for evaluation of abdominal pain.  This patient was hospitalized for suspected partial small bowel obstruction from previous ileocolectomy from right colon diverticulitis 3 years ago.  He continues with right lower quadrant discomfort, had a negative colonoscopy exam in  [place]-one month ago .He has radiographic and colonoscopy reports  That are written in [place] I cannot interpret them.  He did have an anal polyp excised.  Dr. Mayford Knife has given this patient a diagnosis of IBS and referred him to GI.  As previously seen by surgeons in consultation during his episode of small bowel obstruction which was very transient 3 years ago.  Surgical consultation was done by Dr. Glenna Fellows.  CT scan of the abdomen at that time was entirely normal except for small kidney cysts and renal stones.  He apparently has kidney stones and has urologic consultation pending.  His only complaint today is of constipation with some vague nagging discomfort at his right lower quadrant scar area.  He denies nausea vomiting, upper GI or hepatobiliary complaints.  Appetite is good his weight is stable.  He does not speak one word of English.  He denies melena hematochezia or any specific food intolerances.  I have reviewed this patient's present history, medical and surgical past history, allergies and medications.     ROS:   All systems were reviewed and are negative unless otherwise stated in the HPI.  No Known Allergies Outpatient Prescriptions Prior to Visit  Medication Sig Dispense Refill  . butalbital-acetaminophen-caffeine (FIORICET, ESGIC) 50-325-40 MG per tablet Take 1 tablet by mouth 2 (two) times daily as needed. migraine      . hydrOXYzine (VISTARIL) 25 MG capsule Take 25 mg by mouth at bedtime.      . naproxen (NAPROSYN) 500 MG tablet Take 500 mg by mouth 2 (two) times daily as needed. Pain      .  Tamsulosin HCl (FLOMAX) 0.4 MG CAPS Take 0.4 mg by mouth at bedtime.       No facility-administered medications prior to visit.   Past Medical History  Diagnosis Date  . Diverticulitis of cecum 12/2009    Resected 2011:  COLON, SEGMENTAL RESECTION, RIGHT AND TERMINAL ILEUM : - SEGMENT OF COLON  . Pleural effusion, left   . Kidney stones   . 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 Hypertension   . Diarrhea   . Abdominal pain   . Buzzing in ear   . Right leg numbness   . Chronic diarrhea 03/04/2012  . Colon polyp    Past Surgical History  Procedure Laterality Date  . Cholecystectomy  12/2009    Dr. Johna Sheriff  . Appendectomy  2003    Dr. Carolynne Edouard:  Attempted laparoscopic and subsequent open appendectomy  . Right colectomy  12/2009    Dr Johna Sheriff: open resection for cecal diverticulitis   History   Social History  . Marital Status: Single    Spouse Name: N/A    Number of Children: N/A  . Years of Education: N/A   Occupational History  . unemployed    Social History Main Topics  . Smoking status: Former Smoker -- 0.50 packs/day for 20 years    Quit date: 12/01/2007  . Smokeless tobacco: Never Used  . Alcohol Use: No  . Drug Use: No  . Sexual Activity: None   Other Topics Concern  . None   Social History Narrative  . None  Family History  Problem Relation Age of Onset  . Cancer Mother        Physical Exam: Blood pressure 120/82 pulse 66 and regular and weight 148 with a BMI of 26.29.  Chest is clear and he is in a regular rhythm without murmurs gallops or rubs.  I cannot appreciate stigmata of chronic liver disease. General well developed well nourished patient in no acute distress, appearing their stated age Eyes PERRLA, no icterus, fundoscopic exam per opthamologist Skin no lesions noted Neck supple, no adenopathy, no thyroid enlargement, no tenderness Chest clear to percussion and auscultation Heart no significant murmurs, gallops or rubs noted Abdomen no hepatosplenomegaly  masses or tenderness, BS normal.  Well-healed right lower quadrant scar without evidence of distention, masses or tenderness. Extremities no acute joint lesions, edema, phlebitis or evidence of cellulitis. Neurologic patient oriented x 3, cranial nerves intact, no focal neurologic deficits noted. Psychological mental status normal and normal affect.  Assessment and plan: This patient has had a GI evaluation in [place], I am not sure why Dr. Mayford Knife referred him to GI with mild constipation problems after a recent GI evaluation in his own country.  He of course has no ability to speak [place]..  I placed him on a high fiber diet with daily Benefiber, when necessary MiraLax, and we will try Linzess 145 mcg a day with constipation.  I do not think he currently needs repeat colonoscopy or endoscopy.  His physical exam is unremarkable without abdominal masses or tenderness or evidence of bowel obstruction.  He 3y ago had a  brief hospitalization at [facility] for possible small bowel obstruction related to adhesions from his previous surgery for perforated right colon diverticulitis..  Should problems ensue in the future, he would be a good candidate for CT enterography.  I did not prescribe any analgesic medication.  His abdominal pain problems can be well managed by his primary care physician.  [facility] [place] needs to be more selective in referring appropriate patient to [facility] subspeciality clinics.... this patient wants more repeated testing. I told him I did not feel that this was in order,and he should be followed by his primary care physician.  Again, he had recent CT scan and colonoscopy exams, has had surgery for perforated diverticulitis and has presumed intestinal adhesions and chronic abdominal pain urologic evaluation is pending..  If his pain becomes more problem I would suggest referral to a pain clinic for management.   Copy Dr. Lerry Liner   This patient  became very hostile and not leave the clinic on his own, and was escorted by our security services off of the property.  This certainly raises the possibility of underlying neuropsychiatric problems.

## 2013-07-21 ENCOUNTER — Telehealth: Payer: Self-pay | Admitting: Gastroenterology

## 2013-07-21 NOTE — Telephone Encounter (Signed)
Dismissal Letter sent by Certified Mail 07/21/2013  Received the Return Receipt showing someone picked up the Dismissal Letter 08/01/2013

## 2013-08-03 ENCOUNTER — Ambulatory Visit (INDEPENDENT_AMBULATORY_CARE_PROVIDER_SITE_OTHER): Payer: Medicaid Other | Admitting: General Surgery

## 2013-08-03 DIAGNOSIS — K620 Anal polyp: Secondary | ICD-10-CM

## 2013-08-03 DIAGNOSIS — K621 Rectal polyp: Secondary | ICD-10-CM

## 2013-08-03 NOTE — Patient Instructions (Addendum)
We will call you with recommendations for removal by endoscopy or surgery and schedule this after we call you

## 2013-08-03 NOTE — Progress Notes (Signed)
Chief complaint: Rectal polyp  History: Patient returns to the office with a new problem of a rectal polyp. I know him well the 2 previous emergency ileocolectomy several years ago for perforated cecal diverticulitis. He has had chronic right lower quadrant abdominal pain and diarrhea with an extensive negative workup in the past. He is here with an interpreter as he speaks no [place]. He apparently was in [place] a month or 2 ago and underwent a colonoscopy.At that time he states he was having a lot of difficulty urinating and also severe constipation. He states he was treated for a bladder stone. He states that since his colonoscopy his bowel movements are back to normal for him which are somewhat loose and frequent. He states that a rectal polyp was found during colonoscopy. He does have a report which is in the [place] There are photographs of an obvious polyp and the report indicates the size at the 4-8 mm. I cannot interpret the distance from the anus but he was told it was very close to the anus. He has no rectal pain or bleeding or other change in his bowel movements except as above.  Past Medical History  Diagnosis Date  . Diverticulitis of cecum 12/2009    Resected 2011:  COLON, SEGMENTAL RESECTION, RIGHT AND TERMINAL ILEUM : - SEGMENT OF COLON  . Pleural effusion, left   . Kidney stones   . 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 Hypertension   . Diarrhea   . Abdominal pain   . Buzzing in ear   . Right leg numbness   . Chronic diarrhea 03/04/2012  . Colon polyp    Past Surgical History  Procedure Laterality Date  . Cholecystectomy  12/2009    Dr. Johna Sheriff  . Appendectomy  2003    Dr. Carolynne Edouard:  Attempted laparoscopic and subsequent open appendectomy  . Right colectomy  12/2009    Dr Johna Sheriff: open resection for cecal diverticulitis   Current Outpatient Prescriptions  Medication Sig Dispense Refill  . butalbital-acetaminophen-caffeine (FIORICET, ESGIC) 50-325-40 MG per tablet Take 1 tablet by  mouth 2 (two) times daily as needed. migraine      . hydrOXYzine (VISTARIL) 25 MG capsule Take 25 mg by mouth at bedtime.      . Linaclotide (LINZESS) 145 MCG CAPS capsule Take 1 capsule (145 mcg total) by mouth daily.  8 capsule  0  . naproxen (NAPROSYN) 500 MG tablet Take 500 mg by mouth 2 (two) times daily as needed. Pain      . Tamsulosin HCl (FLOMAX) 0.4 MG CAPS Take 0.4 mg by mouth at bedtime.       No current facility-administered medications for this visit.   No Known Allergies History  Substance Use Topics  . Smoking status: Former Smoker -- 0.50 packs/day for 20 years    Quit date: 12/01/2007  . Smokeless tobacco: Never Used  . Alcohol Use: No   Exam: There were no vitals taken for this visit. General: Thin Asian male in no distress Abdomen: Well-healed midline incision. Soft and nontender. Rectal on digital rectal exam I can feel a small mass at the very tip of my examining finger which feels to be against the right lateral rectal wall. It would be consistent with the photographed small polyp.  Assessment and plan: Apparent rectal polyp. I cannot determine from the report the distance from the anus but I do feel that I can palpate an area that is consistent with the photographed finding. This likely could  be removed transanally under general anesthesia although it is a little bit high and possibly it could be removed endoscopically without need for general anesthesia. I'm going to discuss this with Dr. Maisie Fus and after this discussion I will contact the patient regarding our recommendations of endoscopic versus transanal excision. I discussed both procedures including indications and the nature of the problem and risks of general anesthesia if we had to do this transanally or risks of bleeding and slight risk of infection. All his questions were answered the

## 2013-08-16 ENCOUNTER — Encounter (INDEPENDENT_AMBULATORY_CARE_PROVIDER_SITE_OTHER): Payer: Self-pay

## 2013-08-17 ENCOUNTER — Telehealth (INDEPENDENT_AMBULATORY_CARE_PROVIDER_SITE_OTHER): Payer: Self-pay | Admitting: General Surgery

## 2013-08-17 NOTE — Telephone Encounter (Signed)
Pt and daughter called in to ask when sx would be?

## 2013-09-13 ENCOUNTER — Ambulatory Visit (HOSPITAL_COMMUNITY)
Admission: RE | Admit: 2013-09-13 | Discharge: 2013-09-13 | Disposition: A | Discharge status: Home / Self Care | Payer: Medicaid Other | Source: Ambulatory Visit | Attending: General Surgery | Admitting: General Surgery

## 2013-09-13 ENCOUNTER — Encounter (HOSPITAL_COMMUNITY): Payer: Self-pay | Admitting: *Deleted

## 2013-09-13 ENCOUNTER — Encounter (HOSPITAL_COMMUNITY)
Admission: RE | Disposition: A | Discharge status: Home / Self Care | Payer: Self-pay | Source: Ambulatory Visit | Attending: General Surgery

## 2013-09-13 DIAGNOSIS — D128 Benign neoplasm of rectum: Secondary | ICD-10-CM | POA: Insufficient documentation

## 2013-09-13 DIAGNOSIS — Z79899 Other long term (current) drug therapy: Secondary | ICD-10-CM | POA: Insufficient documentation

## 2013-09-13 DIAGNOSIS — I10 Essential (primary) hypertension: Secondary | ICD-10-CM | POA: Insufficient documentation

## 2013-09-13 DIAGNOSIS — K620 Anal polyp: Secondary | ICD-10-CM | POA: Insufficient documentation

## 2013-09-13 DIAGNOSIS — D126 Benign neoplasm of colon, unspecified: Secondary | ICD-10-CM

## 2013-09-13 HISTORY — PX: FLEXIBLE SIGMOIDOSCOPY: SHX5431

## 2013-09-13 SURGERY — SIGMOIDOSCOPY, FLEXIBLE
Anesthesia: Moderate Sedation

## 2013-09-13 MED ORDER — SODIUM CHLORIDE 0.9 % IV SOLN
INTRAVENOUS | Status: DC
  Administered 2013-09-13: 500 mL via INTRAVENOUS

## 2013-09-13 MED ORDER — FENTANYL CITRATE 0.05 MG/ML IJ SOLN
INTRAMUSCULAR | Status: AC
  Filled 2013-09-13: qty 2

## 2013-09-13 MED ORDER — MIDAZOLAM HCL 10 MG/2ML IJ SOLN
INTRAMUSCULAR | Status: AC
  Filled 2013-09-13: qty 2

## 2013-09-13 NOTE — H&P (Signed)
Chief complaint: Rectal polyp   History: Patient returns to the office with a new problem of a rectal polyp.He is s/p a previous emergency ileocolectomy several years ago for perforated cecal diverticulitis. He has had chronic right lower quadrant abdominal pain and diarrhea with an extensive negative workup in the past.  He apparently was in [place] a month or 2 ago and underwent a colonoscopy.  At that time he states he was having a lot of difficulty urinating and also severe constipation. He states he was treated for a bladder stone. He states that since his colonoscopy his bowel movements are back to normal for him which are somewhat loose and frequent. He states that a rectal polyp was found during colonoscopy. He does have a report which is in the [place] There are photographs of an obvious polyp and the report indicates the size at the 4-8 mm. I cannot interpret the distance from the anus but he was told it was very close to the anus. He has no rectal pain or bleeding or other change in his bowel movements except as above.    Past Medical History   Diagnosis  Date   .  Diverticulitis of cecum  12/2009       Resected 2011:  COLON, SEGMENTAL RESECTION, RIGHT AND TERMINAL ILEUM : - SEGMENT OF COLON   .  Pleural effusion, left     .  Kidney stones     .  Positive PPD  12/08/10   .  Hypertension     .  Diarrhea     .  Abdominal pain     .  Buzzing in ear     .  Right leg numbness     .  Chronic diarrhea  03/04/2012   .  Colon polyp         Past Surgical History   Procedure  Laterality  Date   .  Cholecystectomy    12/2009       Dr. Johna Sheriff   .  Appendectomy    2003       Dr. Carolynne Edouard:  Attempted laparoscopic and subsequent open appendectomy   .  Right colectomy    12/2009       Dr Johna Sheriff: open resection for cecal diverticulitis       Current Outpatient Prescriptions   Medication  Sig  Dispense  Refill   .  butalbital-acetaminophen-caffeine (FIORICET, ESGIC) 50-325-40 MG per tablet   Take 1 tablet by mouth 2 (two) times daily as needed. migraine         .  hydrOXYzine (VISTARIL) 25 MG capsule  Take 25 mg by mouth at bedtime.         .  Linaclotide (LINZESS) 145 MCG CAPS capsule  Take 1 capsule (145 mcg total) by mouth daily.   8 capsule   0   .  naproxen (NAPROSYN) 500 MG tablet  Take 500 mg by mouth 2 (two) times daily as needed. Pain         .  Tamsulosin HCl (FLOMAX) 0.4 MG CAPS  Take 0.4 mg by mouth at bedtime.             No current facility-administered medications for this visit.      No Known Allergies History   Substance Use Topics   .  Smoking status:  Former Smoker -- 0.50 packs/day for 20 years       Quit date:  12/01/2007   .  Smokeless tobacco:  Never Used   .  Alcohol Use:  No      Exam: Filed Vitals:   09/13/13 0805  BP: 145/71  Temp: 97.8 F (36.6 C)  Resp: 27   . General: Thin Asian male in no distress CV:RRR Lungs: CTA Abdomen: Well-healed midline incision. Soft and nontender. digital rectal exam: (per Dr Johna Sheriff) small mass at the very tip of my examining finger which feels to be against the right lateral rectal wall. It would be consistent with the photographed small polyp.  Colonoscopy report from [place] reviewed.  Assessment and plan: Apparent rectal polyp. I cannot determine from the report the distance from the anus but I does seem that it can be palpated in an area that is consistent with the photographed finding. This likely could be removed endoscopically without need for general anesthesia. I discussed the procedure including indications and the nature of the problem and risks of sedation anesthesia if we needed to do this, as well as risks of bleeding, perforation and slight risk of infection. All his questions were answered via an interpreter phone line.

## 2013-09-13 NOTE — Op Note (Signed)
[facility] [address] [place], [address]   FLEXIBLE SIGMOIDOSCOPY PROCEDURE REPORT  PATIENT: Eduardo West, Eduardo West  MR#: [id number] BIRTHDATE: March 16, 1961 , 52  yrs. old GENDER: Male ENDOSCOPIST: Vanita Panda, MD REFERRED BY: Glenna Fellows, M.D. PROCEDURE DATE:  09/13/2013 PROCEDURE:   Sigmoidoscopy with biopsy and Sigmoidoscopy with snare  ASA CLASS:   Class II INDICATIONS:follow up of rectal polyps. MEDICATIONS: None  DESCRIPTION OF PROCEDURE:   After the risks benefits and alternatives of the procedure were thoroughly explained, informed consent was obtained.  revealed no abnormalities of the rectum. The       endoscope was introduced through the anus  and advanced to the splenic flexure , No adverse events experienced.   The quality of the prep was excellent .  The instrument was then slowly withdrawn as the mucosa was fully examined.       1.  COLON FINDINGS: A polypoid shaped pedunculated polyp ranging between 3-87mm in size was found in the distal rectum, approximately 5cm from anal verge on first valve.  A polypectomy was performed using snare cautery.  The resection was complete and the polyp tissue was completely retrieved. 2.  A smooth sessile polyp measuring 2 mm in size was found in the distal rectum.  A polypectomy was performed with cold forceps.  The resection was complete and the polyp tissue was completely retrieved. Retroflexion was not performed due to a narrow rectal vault. The scope was then withdrawn from the patient and the procedure terminated.  COMPLICATIONS: There were no complications.  ENDOSCOPIC IMPRESSION: 1.   Pedunculated polyp ranging between 3-28mm in size was found in the rectum; polypectomy was performed using snare cautery 2.   Sessile polyp measuring 2 mm in size was found in the rectum; polypectomy was performed with cold forceps  RECOMMENDATIONS: await biopsy results   REPEAT EXAM: for  Colonoscopy. standard discharge  _______________________________ eSigned:  Vanita Panda, MD 09/13/2013 9:46 AM   [id number] Johna Sheriff, MD Lerry Liner MD

## 2013-09-14 ENCOUNTER — Encounter (INDEPENDENT_AMBULATORY_CARE_PROVIDER_SITE_OTHER): Payer: Self-pay | Admitting: General Surgery

## 2013-09-15 ENCOUNTER — Encounter (HOSPITAL_COMMUNITY): Payer: Self-pay | Admitting: General Surgery

## 2013-09-27 ENCOUNTER — Encounter (INDEPENDENT_AMBULATORY_CARE_PROVIDER_SITE_OTHER): Payer: Medicaid Other | Admitting: General Surgery

## 2013-12-25 ENCOUNTER — Emergency Department (HOSPITAL_COMMUNITY): Payer: Medicaid Other

## 2013-12-25 ENCOUNTER — Observation Stay (HOSPITAL_COMMUNITY)
Admission: EM | Admit: 2013-12-25 | Discharge: 2013-12-27 | Disposition: A | Discharge status: Home / Self Care | Payer: Medicaid Other | Attending: Internal Medicine | Admitting: Internal Medicine

## 2013-12-25 ENCOUNTER — Encounter (HOSPITAL_COMMUNITY): Payer: Self-pay | Admitting: Emergency Medicine

## 2013-12-25 DIAGNOSIS — Z860100 Personal history of colon polyps, unspecified: Secondary | ICD-10-CM | POA: Insufficient documentation

## 2013-12-25 DIAGNOSIS — R0789 Other chest pain: Principal | ICD-10-CM | POA: Insufficient documentation

## 2013-12-25 DIAGNOSIS — R197 Diarrhea, unspecified: Secondary | ICD-10-CM

## 2013-12-25 DIAGNOSIS — G8929 Other chronic pain: Secondary | ICD-10-CM | POA: Diagnosis present

## 2013-12-25 DIAGNOSIS — M2550 Pain in unspecified joint: Secondary | ICD-10-CM | POA: Insufficient documentation

## 2013-12-25 DIAGNOSIS — R5383 Other fatigue: Secondary | ICD-10-CM

## 2013-12-25 DIAGNOSIS — R0609 Other forms of dyspnea: Secondary | ICD-10-CM

## 2013-12-25 DIAGNOSIS — J189 Pneumonia, unspecified organism: Secondary | ICD-10-CM | POA: Diagnosis present

## 2013-12-25 DIAGNOSIS — K56609 Unspecified intestinal obstruction, unspecified as to partial versus complete obstruction: Secondary | ICD-10-CM

## 2013-12-25 DIAGNOSIS — M549 Dorsalgia, unspecified: Secondary | ICD-10-CM

## 2013-12-25 DIAGNOSIS — J159 Unspecified bacterial pneumonia: Secondary | ICD-10-CM | POA: Insufficient documentation

## 2013-12-25 DIAGNOSIS — J690 Pneumonitis due to inhalation of food and vomit: Secondary | ICD-10-CM

## 2013-12-25 DIAGNOSIS — I10 Essential (primary) hypertension: Secondary | ICD-10-CM | POA: Diagnosis present

## 2013-12-25 DIAGNOSIS — K219 Gastro-esophageal reflux disease without esophagitis: Secondary | ICD-10-CM

## 2013-12-25 DIAGNOSIS — IMO0001 Reserved for inherently not codable concepts without codable children: Secondary | ICD-10-CM | POA: Insufficient documentation

## 2013-12-25 DIAGNOSIS — R0602 Shortness of breath: Secondary | ICD-10-CM | POA: Insufficient documentation

## 2013-12-25 DIAGNOSIS — R5381 Other malaise: Secondary | ICD-10-CM | POA: Insufficient documentation

## 2013-12-25 DIAGNOSIS — R109 Unspecified abdominal pain: Secondary | ICD-10-CM

## 2013-12-25 DIAGNOSIS — R9431 Abnormal electrocardiogram [ECG] [EKG]: Secondary | ICD-10-CM | POA: Insufficient documentation

## 2013-12-25 DIAGNOSIS — R7611 Nonspecific reaction to tuberculin skin test without active tuberculosis: Secondary | ICD-10-CM

## 2013-12-25 DIAGNOSIS — Z79899 Other long term (current) drug therapy: Secondary | ICD-10-CM | POA: Insufficient documentation

## 2013-12-25 DIAGNOSIS — R0989 Other specified symptoms and signs involving the circulatory and respiratory systems: Secondary | ICD-10-CM

## 2013-12-25 DIAGNOSIS — Z87442 Personal history of urinary calculi: Secondary | ICD-10-CM | POA: Insufficient documentation

## 2013-12-25 DIAGNOSIS — J90 Pleural effusion, not elsewhere classified: Secondary | ICD-10-CM

## 2013-12-25 DIAGNOSIS — R0781 Pleurodynia: Secondary | ICD-10-CM

## 2013-12-25 DIAGNOSIS — Z87891 Personal history of nicotine dependence: Secondary | ICD-10-CM | POA: Insufficient documentation

## 2013-12-25 DIAGNOSIS — K529 Noninfective gastroenteritis and colitis, unspecified: Secondary | ICD-10-CM | POA: Diagnosis present

## 2013-12-25 DIAGNOSIS — R079 Chest pain, unspecified: Secondary | ICD-10-CM

## 2013-12-25 LAB — PRO B NATRIURETIC PEPTIDE: PRO B NATRI PEPTIDE: 41.7 pg/mL (ref 0–125)

## 2013-12-25 LAB — COMPREHENSIVE METABOLIC PANEL
ALT: 18 U/L (ref 0–53)
AST: 23 U/L (ref 0–37)
Albumin: 4 g/dL (ref 3.5–5.2)
Alkaline Phosphatase: 75 U/L (ref 39–117)
BUN: 8 mg/dL (ref 6–23)
CALCIUM: 8.7 mg/dL (ref 8.4–10.5)
CO2: 23 meq/L (ref 19–32)
CREATININE: 0.81 mg/dL (ref 0.50–1.35)
Chloride: 104 mEq/L (ref 96–112)
GLUCOSE: 92 mg/dL (ref 70–99)
Potassium: 3.6 mEq/L — ABNORMAL LOW (ref 3.7–5.3)
SODIUM: 140 meq/L (ref 137–147)
TOTAL PROTEIN: 7.2 g/dL (ref 6.0–8.3)
Total Bilirubin: 1.3 mg/dL — ABNORMAL HIGH (ref 0.3–1.2)

## 2013-12-25 LAB — CBC WITH DIFFERENTIAL/PLATELET
Basophils Absolute: 0 10*3/uL (ref 0.0–0.1)
Basophils Relative: 0 % (ref 0–1)
EOS PCT: 4 % (ref 0–5)
Eosinophils Absolute: 0.3 10*3/uL (ref 0.0–0.7)
HCT: 42.8 % (ref 39.0–52.0)
Hemoglobin: 14 g/dL (ref 13.0–17.0)
LYMPHS ABS: 1.8 10*3/uL (ref 0.7–4.0)
LYMPHS PCT: 27 % (ref 12–46)
MCH: 31.3 pg (ref 26.0–34.0)
MCHC: 32.7 g/dL (ref 30.0–36.0)
MCV: 95.5 fL (ref 78.0–100.0)
Monocytes Absolute: 0.4 10*3/uL (ref 0.1–1.0)
Monocytes Relative: 5 % (ref 3–12)
Neutro Abs: 4.1 10*3/uL (ref 1.7–7.7)
Neutrophils Relative %: 63 % (ref 43–77)
PLATELETS: 277 10*3/uL (ref 150–400)
RBC: 4.48 MIL/uL (ref 4.22–5.81)
RDW: 12.1 % (ref 11.5–15.5)
WBC: 6.5 10*3/uL (ref 4.0–10.5)

## 2013-12-25 LAB — TROPONIN I: Troponin I: <0.3 ng/mL (ref <0.30)

## 2013-12-25 LAB — D-DIMER, QUANTITATIVE: D-Dimer, Quant: <0.27 ug/mL-FEU (ref 0.00–0.48)

## 2013-12-25 LAB — POCT I-STAT TROPONIN I: TROPONIN I, POC: 0 ng/mL (ref 0.00–0.08)

## 2013-12-25 MED ORDER — ACETAMINOPHEN 650 MG RE SUPP: 650.0000 mg | Freq: Four times a day (QID) | RECTAL | Status: DC | PRN

## 2013-12-25 MED ORDER — ONDANSETRON HCL 4 MG/2ML IJ SOLN
4.0000 mg | Freq: Once | INTRAMUSCULAR | Status: AC
  Administered 2013-12-25: 4 mg via INTRAVENOUS
  Filled 2013-12-25: qty 2

## 2013-12-25 MED ORDER — ALBUTEROL SULFATE (2.5 MG/3ML) 0.083% IN NEBU: 2.5000 mg | INHALATION_SOLUTION | RESPIRATORY_TRACT | Status: DC | PRN

## 2013-12-25 MED ORDER — LISINOPRIL 10 MG PO TABS
10.0000 mg | ORAL_TABLET | Freq: Every day | ORAL | Status: DC
  Administered 2013-12-25 – 2013-12-27 (×3): 10 mg via ORAL
  Filled 2013-12-25 (×3): qty 1

## 2013-12-25 MED ORDER — NITROGLYCERIN 0.4 MG SL SUBL: 0.4000 mg | SUBLINGUAL_TABLET | SUBLINGUAL | Status: DC | PRN

## 2013-12-25 MED ORDER — DEXTROSE 5 % IV SOLN
1.0000 g | Freq: Three times a day (TID) | INTRAVENOUS | Status: DC
  Filled 2013-12-25 (×2): qty 1

## 2013-12-25 MED ORDER — SODIUM CHLORIDE 0.9 % IV SOLN
INTRAVENOUS | Status: DC
  Administered 2013-12-25: 10 mL/h via INTRAVENOUS

## 2013-12-25 MED ORDER — DEXTROSE 5 % IV SOLN
1.0000 g | Freq: Once | INTRAVENOUS | Status: DC
  Administered 2013-12-25: 1 g via INTRAVENOUS
  Filled 2013-12-25: qty 10

## 2013-12-25 MED ORDER — ACETAMINOPHEN 325 MG PO TABS: 650.0000 mg | ORAL_TABLET | Freq: Four times a day (QID) | ORAL | Status: DC | PRN

## 2013-12-25 MED ORDER — VANCOMYCIN HCL IN DEXTROSE 750-5 MG/150ML-% IV SOLN
750.0000 mg | Freq: Three times a day (TID) | INTRAVENOUS | Status: DC
  Administered 2013-12-25: 750 mg via INTRAVENOUS
  Filled 2013-12-25 (×3): qty 150

## 2013-12-25 MED ORDER — ENOXAPARIN SODIUM 40 MG/0.4ML ~~LOC~~ SOLN
40.0000 mg | SUBCUTANEOUS | Status: DC
  Administered 2013-12-25 – 2013-12-26 (×2): 40 mg via SUBCUTANEOUS
  Filled 2013-12-25 (×3): qty 0.4

## 2013-12-25 MED ORDER — FAMOTIDINE 20 MG PO TABS
20.0000 mg | ORAL_TABLET | Freq: Every day | ORAL | Status: DC
  Administered 2013-12-26 – 2013-12-27 (×2): 20 mg via ORAL
  Filled 2013-12-25 (×3): qty 1

## 2013-12-25 MED ORDER — MORPHINE SULFATE 2 MG/ML IJ SOLN: 2.0000 mg | INTRAMUSCULAR | Status: DC | PRN

## 2013-12-25 MED ORDER — LORATADINE 10 MG PO TABS
10.0000 mg | ORAL_TABLET | Freq: Every day | ORAL | Status: DC | PRN
  Filled 2013-12-25: qty 1

## 2013-12-25 MED ORDER — INFLUENZA VAC SPLIT QUAD 0.5 ML IM SUSP
0.5000 mL | INTRAMUSCULAR | Status: DC
  Filled 2013-12-25: qty 0.5

## 2013-12-25 MED ORDER — IBUPROFEN 600 MG PO TABS
600.0000 mg | ORAL_TABLET | Freq: Three times a day (TID) | ORAL | Status: AC
  Administered 2013-12-25 – 2013-12-27 (×5): 600 mg via ORAL
  Filled 2013-12-25 (×8): qty 1

## 2013-12-25 MED ORDER — MORPHINE SULFATE 4 MG/ML IJ SOLN
2.0000 mg | Freq: Once | INTRAMUSCULAR | Status: AC
  Administered 2013-12-25: 2 mg via INTRAVENOUS
  Filled 2013-12-25: qty 1

## 2013-12-25 MED ORDER — DEXTROSE 5 % IV SOLN: 500.0000 mg | Freq: Once | INTRAVENOUS | Status: DC

## 2013-12-25 MED ORDER — SODIUM CHLORIDE 0.9 % IJ SOLN
3.0000 mL | Freq: Two times a day (BID) | INTRAMUSCULAR | Status: DC
  Administered 2013-12-25 – 2013-12-26 (×2): 3 mL via INTRAVENOUS

## 2013-12-25 MED ORDER — ONDANSETRON HCL 4 MG/2ML IJ SOLN: 4.0000 mg | Freq: Four times a day (QID) | INTRAMUSCULAR | Status: DC | PRN

## 2013-12-25 MED ORDER — CHOLESTYRAMINE 4 G PO PACK
2.0000 g | PACK | Freq: Every day | ORAL | Status: DC
  Administered 2013-12-26: 2 g via ORAL
  Filled 2013-12-25 (×3): qty 1

## 2013-12-25 MED ORDER — ASPIRIN EC 325 MG PO TBEC
325.0000 mg | DELAYED_RELEASE_TABLET | Freq: Every day | ORAL | Status: DC
  Administered 2013-12-25 – 2013-12-27 (×3): 325 mg via ORAL
  Filled 2013-12-25 (×3): qty 1

## 2013-12-25 MED ORDER — HYDROCODONE-ACETAMINOPHEN 5-325 MG PO TABS: 1.0000 | ORAL_TABLET | ORAL | Status: DC | PRN

## 2013-12-25 MED ORDER — DEXTROSE 5 % IV SOLN
1.0000 g | Freq: Once | INTRAVENOUS | Status: DC
  Filled 2013-12-25: qty 1

## 2013-12-25 MED ORDER — ONDANSETRON HCL 4 MG PO TABS: 4.0000 mg | ORAL_TABLET | Freq: Four times a day (QID) | ORAL | Status: DC | PRN

## 2013-12-25 NOTE — ED Notes (Signed)
Pt returned from xray. Family at bedside. Pt back on monitor.

## 2013-12-25 NOTE — ED Notes (Signed)
Pt speaks no english. Using translator line and pt's daughter speaks english.

## 2013-12-25 NOTE — Progress Notes (Signed)
ANTIBIOTIC CONSULT NOTE - INITIAL  Pharmacy Consult for Vancomycin and Cefepime x 8 days Indication: pneumonia  No Known Allergies  Patient Measurements: Height: 5\' 3"  (160 cm) Weight: 145 lb 8.1 oz (66 kg) IBW/kg (Calculated) : 56.9  Vital Signs: Temp: 98.4 F (36.9 C) (01/26 1058) Temp src: Oral (01/26 1058) BP: 129/80 mmHg (01/26 1345) Pulse Rate: 64 (01/26 1345) Intake/Output from previous day:   Intake/Output from this shift:    Labs:  Recent Labs  12/25/13 1150  WBC 6.5  HGB 14.0  PLT 277  CREATININE 0.81   Estimated Creatinine Clearance: 87.8 ml/min (by C-G formula based on Cr of 0.81). No results found for this basename: VANCOTROUGH, VANCOPEAK, VANCORANDOM, GENTTROUGH, GENTPEAK, GENTRANDOM, TOBRATROUGH, TOBRAPEAK, TOBRARND, AMIKACINPEAK, AMIKACINTROU, AMIKACIN,  in the last 72 hours   Microbiology: No results found for this or any previous visit (from the past 720 hour(s)).  Medical History: Past Medical History  Diagnosis Date  . Diverticulitis of cecum 12/2009    Resected 2011:  COLON, SEGMENTAL RESECTION, RIGHT AND TERMINAL ILEUM : - SEGMENT OF COLON  . Pleural effusion, left   . Kidney stones   . 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 Hypertension   . Diarrhea   . Abdominal pain   . Buzzing in ear   . Right leg numbness   . Chronic diarrhea 03/04/2012  . Colon polyp     Medications:  See electronic med rec  Assessment: 53 y.o. male presents with CP, SOB. Pt speaks only [place]. To begin broad spectrum antibiotics (Vancomycin and Cefepime) for PNA. Pt already received Rocephin 1gm in ED ~1330.  Goal of Therapy:  Vancomycin trough level 15-20 mcg/ml  Plan:  1. Cefepime 1gm IV q8h 2. Vancomycin 750mg  IV q8h. 3. Will f/u renal function, pt's clinical condition, , micro data, and trough prn  Sherlon Handing, PharmD, BCPS Clinical pharmacist, pager [phone] 12/25/2013,2:10 PM

## 2013-12-25 NOTE — ED Provider Notes (Signed)
CSN: [id number]     Arrival date & time 12/25/13  1046 History   First MD Initiated Contact with Patient 12/25/13 1050     Chief Complaint  Patient presents with  . Chest Pain   (Consider location/radiation/quality/duration/timing/severity/associated sxs/prior Treatment) HPI Eduardo West is a 53 y.o. male, vietnamese speaking only, interpreter phone used. Presents to emergency department from primary care Dr. office with complaint of chest pain or shortness of breath. During history taking patient is mentioning multiple complaints including pain in his groin from his appendectomy in 2011, pain in his legs, weakness, dizziness, presyncopal episodes, pain in his abdomen, states has history of liver disease and "fluid on the lungs", and this exertional chest pain. Patient states that pain comes and goes. he states that it comes almost every hour. It lasts several minutes at a time. States it is in the center chest and radiates towards the left side. He describes it as sharp. States associated shortness of breath when having the pain, and dizziness, states "I feel like a lot of pass out." Patient states as a result of this pain and shortness of breath he hasn't been able to do any chores or even walk around the house. He states pain has been there for 2 weeks. He went to his primary care doctor today who noted some T-wave changes on EKG and sent him here for further evaluation. Patient received 3 sublingual nitroglycerin by EMS, which helped his pain. He still states his pain is 3 at 10. Patient also states that he was given aspirin. Patient denies having elevated cholesterol, has history of hypertension, no family history of heart problems, former smoker quit in 2009, smoked for 20 years.    Past Surgical History  Procedure Laterality Date  . Cholecystectomy  12/2009    Dr. Excell Seltzer  . Appendectomy  2003    Dr. Marlou Starks:  Attempted laparoscopic and subsequent open appendectomy  . Right colectomy  12/2009     Dr Excell Seltzer: open resection for cecal diverticulitis  . Colon surgery    . Flexible sigmoidoscopy N/A 09/13/2013    Procedure: FLEXIBLE SIGMOIDOSCOPY;  Surgeon: Leighton Ruff, MD;  Location: [facility];  Service: Endoscopy;  Laterality: N/A;   Family History  Problem Relation Age of Onset  . Cancer Mother    History  Substance Use Topics  . Smoking status: Former Smoker -- 0.50 packs/day for 20 years    Quit date: 12/01/2007  . Smokeless tobacco: Never Used  . Alcohol Use: No    Review of Systems  Constitutional: Negative for fever and chills.  Respiratory: Positive for chest tightness and shortness of breath. Negative for cough.   Cardiovascular: Positive for chest pain. Negative for palpitations and leg swelling.  Gastrointestinal: Negative for nausea, vomiting, abdominal pain, diarrhea and abdominal distention.  Genitourinary: Negative for dysuria, urgency, frequency and hematuria.  Musculoskeletal: Positive for arthralgias and myalgias. Negative for neck pain and neck stiffness.  Skin: Negative for rash.  Allergic/Immunologic: Negative for immunocompromised state.  Neurological: Positive for weakness. Negative for dizziness, light-headedness, numbness and headaches.    Allergies  Review of patient's allergies indicates no known allergies.  Home Medications   Current Outpatient Rx  Name  Route  Sig  Dispense  Refill  . butalbital-acetaminophen-caffeine (FIORICET, ESGIC) 50-325-40 MG per tablet   Oral   Take 1 tablet by mouth 2 (two) times daily as needed for migraine.          . cholestyramine (QUESTRAN) 4 GM/DOSE powder  Oral   Take 1 g by mouth daily.         [name] Kitchen lisinopril (PRINIVIL,ZESTRIL) 10 MG tablet   Oral   Take 10 mg by mouth daily.         [name] Kitchen loratadine (CLARITIN) 10 MG tablet   Oral   Take 10 mg by mouth daily as needed for allergies.          BP 130/99  Pulse 79  Temp(Src) 98.4 F (36.9 C) (Oral)  Resp 19  SpO2 97% Physical Exam   Nursing note and vitals reviewed. Constitutional: He appears well-developed and well-nourished. No distress.  HENT:  Head: Normocephalic and atraumatic.  Eyes: Conjunctivae are normal.  Neck: Neck supple.  Cardiovascular: Normal rate, regular rhythm and normal heart sounds.   Pulmonary/Chest: Effort normal. No respiratory distress. He has no wheezes. He has no rales. He exhibits tenderness.  Left chest tenderness  Abdominal: Soft. Bowel sounds are normal. He exhibits no distension. There is no tenderness. There is no rebound.  Musculoskeletal: He exhibits no edema.  Neurological: He is alert.  Skin: Skin is warm and dry.    ED Course  Procedures (including critical care time) Labs Review Labs Reviewed  COMPREHENSIVE METABOLIC PANEL - Abnormal; Notable for the following:    Potassium 3.6 (*)    Total Bilirubin 1.3 (*)    All other components within normal limits  CBC WITH DIFFERENTIAL  PRO B NATRIURETIC PEPTIDE  POCT I-STAT TROPONIN I   Imaging Review Dg Chest 2 View  12/25/2013   CLINICAL DATA:  Chest pain  EXAM: CHEST  2 VIEW  COMPARISON:  07/25/2013  FINDINGS: Normal heart size. There is a small left pleural effusion identified. Atelectasis and or airspace disease is noted in the left base. Right lung appears clear. The visualized bony structures appear normal.  IMPRESSION: Left lower lobe airspace opacity and small subpulmonic effusion.   Electronically Signed   By: Kerby Moors M.D.   On: 12/25/2013 12:18    EKG Interpretation   None       MDM   1. Chest pain   2. HCAP (healthcare-associated pneumonia)   3. ECG abnormality     Pt here with left sided chest pain, dizziness and SOB on excertion. Sent from PCP office. He has changes that were noted on ECG, compared to one from 7/14, with new T wave inversion in V4, V5, V6. He received aspirin 325mg , 3 SL nitros. His pain improved, but still there. Will add morphine 2mg . Labs and CXR pending.    Pt's CXR showed  pneumonia, pt has no symptoms of pneumonia other than the pain and SOB. Will cover with antibiotics right now. He does have risk factors for CAD. Will need further obs for rule out given abnormal ECG. Pt also was found to have frequent PVCs and bigeminy at times, pt has history of the same, and prior ECG showing this from 7/14. PE considered, however at this time doubt, he is not tachycardic, not hypoxic, not tachypnec, no recent travel. Will call hospitalist for admission. Trop and labs negative at this time.    Filed Vitals:   12/25/13 1215 12/25/13 1230 12/25/13 1330 12/25/13 1345  BP: 148/98 141/98 141/99 129/80  Pulse: 37 74 36 64  Temp:      TempSrc:      Resp: 14 15 12 12   Height:    5\' 3"  (1.6 m)  Weight:    145 lb 8.1 oz (66 kg)  SpO2: 97% 96% 100% 98%    All results discussed with pt and he voiced understanding.     Renold Genta, PA-C 12/25/13 2008

## 2013-12-25 NOTE — ED Notes (Signed)
Spoke with Robinson in lab. Reports the BNP can be added on to prior blood work.

## 2013-12-25 NOTE — ED Notes (Signed)
Pt's wife, 2 teenage daughter allowed back to assist with language barrier. Masked placed on children, supposed to be calling for ride to pick the kids up.

## 2013-12-25 NOTE — H&P (Addendum)
History and Physical  Eduardo West EXB:[id number] DOB: 05-09-1961 DOA: 12/25/2013  Referring physician: EDP PCP: Harvie Junior, MD  Outpatient Specialists:  1. Surgery  Chief Complaint: left sided chest pain.  HPI: Eduardo West is a 53 y.o. male [place] speaking male, was sent from PCPs office for evaluation of left-sided chest pain an abnormal EKG. History was obtained from patient using interpreter phone. Patient has multiple medical problems including hypertension, chronic diarrhea, PPD +, several surgeries-cholecystectomy, appendectomy, right colectomy for diverticulitis. He gives vague complaints and generally not being well for years which he attributes to his multiple surgeries. In any event, he gives pproximately 2 week history of left-sided chest pain which was gradual in onset and has progressively gotten worse. He describes the chest pain as sharp, moderate in intensity, radiates to the left infraaxillary region, worse with inspiration and at times palpating the chest, lasts 30-45 minutes and subsides spontaneously. He states that he has dyspnea only during episodes of pain. He denies cough but states that he's had high fevers and chills but has not recorded temperatures. No history of recent long-distance travel or sickly contacts. Complaints of sore throat. He states that his appetite is good and has chronic diarrhea which has not changed. Denies abdominal pain, nausea or vomiting. He complains of dizziness at times but no passing out. No skin rashes. In the ED, lab work was unremarkable, chest x-ray was read as left lower lobe airspace opacity and small subpulmonic effusion, EKG showed left anterior fascicular block but did not show T-wave inversions seen in lead V 5-6 that was seen in PCPs office. Hospitalist admission was requested.   Review of Systems: All systems reviewed and apart from history of presenting illness, are negative.  Past Medical History  Diagnosis Date  .  Diverticulitis of cecum 12/2009    Resected 2011:  COLON, SEGMENTAL RESECTION, RIGHT AND TERMINAL ILEUM : - SEGMENT OF COLON  . Pleural effusion, left   . Kidney stones   . 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 Hypertension   . Diarrhea   . Abdominal pain   . Buzzing in ear   . Right leg numbness   . Chronic diarrhea 03/04/2012  . Colon polyp    Past Surgical History  Procedure Laterality Date  . Cholecystectomy  12/2009    Dr. Excell Seltzer  . Appendectomy  2003    Dr. Marlou Starks:  Attempted laparoscopic and subsequent open appendectomy  . Right colectomy  12/2009    Dr Excell Seltzer: open resection for cecal diverticulitis  . Colon surgery    . Flexible sigmoidoscopy N/A 09/13/2013    Procedure: FLEXIBLE SIGMOIDOSCOPY;  Surgeon: Leighton Ruff, MD;  Location: [facility];  Service: Endoscopy;  Laterality: N/A;   Social History:  reports that he quit smoking about 6 years ago. He has never used smokeless tobacco. He reports that he does not drink alcohol or use illicit drugs. Not married but has a girlfriend and 2 children. Unemployed. Independent of activities of daily living.  No Known Allergies  Family History  Problem Relation Age of Onset  . Cancer Mother   Denies family history of MI/CAD  Prior to Admission medications   Medication Sig Start Date End Date Taking? Authorizing Provider  butalbital-acetaminophen-caffeine (FIORICET, ESGIC) 50-325-40 MG per tablet Take 1 tablet by mouth 2 (two) times daily as needed for migraine.    Yes Historical Provider, MD  cholestyramine Lucrezia Starch) 4 GM/DOSE powder Take 1 g by mouth daily.   Yes Historical Provider, MD  lisinopril (PRINIVIL,ZESTRIL) 10 MG tablet Take 10 mg by mouth daily.   Yes Historical Provider, MD  loratadine (CLARITIN) 10 MG tablet Take 10 mg by mouth daily as needed for allergies.   Yes Historical Provider, MD   Physical Exam: Filed Vitals:   12/25/13 1330 12/25/13 1345 12/25/13 1400 12/25/13 1500  BP: 141/99 129/80 136/88 158/90  Pulse: 36 64  70 73  Temp:    97.8 F (36.6 C)  TempSrc:    Oral  Resp: 12 12 23 14   Height:  5\' 3"  (1.6 m)    Weight:  66 kg (145 lb 8.1 oz)    SpO2: 100% 98% 98% 96%     General exam: Moderately built and nourished male patient, lying comfortably supine in bed in no obvious distress.  Head, eyes and ENT: Nontraumatic and normocephalic. Pupils equally reacting to light and accommodation. Oral mucosa moist.  Neck: Supple. No JVD, carotid bruit or thyromegaly.  Lymphatics: No lymphadenopathy.  Respiratory system: Slightly diminished breath sounds in the bases but no crackles, wheezing or rhonchi. No increased work of breathing. Partially reproducible left sided precordial/lower substernal chest pain.  Cardiovascular system: S1 and S2 heard, RRR. No JVD, murmurs, gallops, clicks or pedal edema.  Gastrointestinal system: Abdomen is nondistended, soft and nontender. Normal bowel sounds heard. No organomegaly or masses appreciated. Multiple healed surgical scars.  Central nervous system: Alert and oriented. No focal neurological deficits.  Extremities: Symmetric 5 x 5 power. Peripheral pulses symmetrically felt.   Skin: No rashes or acute findings.  Musculoskeletal system: Negative exam.  Psychiatry: Pleasant and cooperative.   Labs on Admission:  Basic Metabolic Panel:  Recent Labs Lab 12/25/13 1150  NA 140  K 3.6*  CL 104  CO2 23  GLUCOSE 92  BUN 8  CREATININE 0.81  CALCIUM 8.7   Liver Function Tests:  Recent Labs Lab 12/25/13 1150  AST 23  ALT 18  ALKPHOS 75  BILITOT 1.3*  PROT 7.2  ALBUMIN 4.0   No results found for this basename: LIPASE, AMYLASE,  in the last 168 hours No results found for this basename: AMMONIA,  in the last 168 hours CBC:  Recent Labs Lab 12/25/13 1150  WBC 6.5  NEUTROABS 4.1  HGB 14.0  HCT 42.8  MCV 95.5  PLT 277   Cardiac Enzymes: No results found for this basename: CKTOTAL, CKMB, CKMBINDEX, TROPONINI,  in the last 168 hours  BNP  (last 3 results)  Recent Labs  12/25/13 1150  PROBNP 41.7   CBG: No results found for this basename: GLUCAP,  in the last 168 hours  Radiological Exams on Admission: Dg Chest 2 View  12/25/2013   CLINICAL DATA:  Chest pain  EXAM: CHEST  2 VIEW  COMPARISON:  07/25/2013  FINDINGS: Normal heart size. There is a small left pleural effusion identified. Atelectasis and or airspace disease is noted in the left base. Right lung appears clear. The visualized bony structures appear normal.  IMPRESSION: Left lower lobe airspace opacity and small subpulmonic effusion.   Electronically Signed   By: Kerby Moors M.D.   On: 12/25/2013 12:18    EKG: Independently reviewed. Sinus rhythm at 83 beats per minute, LAD, incomplete RBBB and no other acute changes.  Assessment/Plan Principal Problem:   Chest pain, atypical Active Problems:   Chronic abdominal pain   Chronic diarrhea   ECG abnormality   Hypertension   1. Atypical chest pain: DD: Musculoskeletal, rule out ACS/PE, pneumonia (seems less likely in  the absence of cough, dyspnea-which he only has during episodes of chest pain or leukocytosis), GERD versus other etiologies. Admit to telemetry for observation and evaluation. Cycle cardiac enzymes. Check 2-D echo. Check d-dimer stat: If positive will get CTA chest rule out PE & at same time can also reveal abnormalities in the lung parenchyma. If d-dimer is negative, may consider noncontrasted CT chest to delineate lung parenchyma. Place on aspirin and sublingual NTG (apparently chest pain improved after 3 doses in ED). NSAIDs to treat for musculoskeletal etiology. In the absence of convincing features for pneumonia, will not start antibiotics at this time. Monitor closely. Will request Cardiologist to consult due to transient EKG changes in the context of chest pain. 2. Hypertension: Continue lisinopril. 3. Chronic diarrhea: Continue Questran 4. Abnormal Chest X Ray: ? Atelectasis Vs ASD/less likely  a usual PNA - see discussion above. Mx as above.     Code Status: Full  Family Communication: None at bedside  Disposition Plan: Home when medically stable   Time spent: 69 minutes  Cathaleen Korol, MD, FACP, FHM. [facility] Pager [phone]  If 7PM-7AM, please contact night-coverage [email or web address] Password [facility] 12/25/2013, 4:47 PM

## 2013-12-25 NOTE — Consult Note (Signed)
Name: Eduardo West is a 53 y.o. male Admit date: 12/25/2013 Referring Physician: Algis West, M.D. Primary Physician:  Eduardo West, M.D. Primary Cardiologist:  Eduardo West, M.D.  Reason for Consultation:  Chest pain  ASSESSMENT: 1. Left precordial and lateral chest pain present intermittently over the past 2 weeks. No clear exertional or pleuritic component/rub. Given the abnormal appearance of the chest x-ray with left pleural effusion and possible infiltrate, infection and pulmonary embolism with infarction on the leading possibilities. This presentation does not represent ACS. He is a past smoker and has a known history of positive PPD. 2. Hypertension 3. History of positive PPD 4. History of left pleural effusion 5. History of diverticulitis  PLAN: 1. 2-D Doppler echocardiogram 2. Serial cardiac markers and baseline BNP 3. Consider CT angiogram of the chest to rule out pulmonary embolus with Infarction 4. Sedimentation rate 5. May need pulmonary consultation for consideration of pleural tap and further workup   HPI: 53 year old gentleman whose daughter served as Astronomer. His daughter is a young teenager. The patient has a two-week history of left precordial and lateral chest discomfort. The discomfort occurs randomly and can be intense. It is not necessarily precipitated by exertion. He gives him more trouble at night when he tries to sleep. There is no associated cough or hemoptysis. When the pain is severe he is short of breath. He denies palpitations. The pain is a sharp quality discomfort. He is pain free at the time of the exam which he attributes to pain medications which have been given. He denies chills, fever, anorexia, cough, and wheezing. He is a prior smoker. Is no family history of coronary disease. He is nondiabetic. He is generally active  PMH:   Past Medical History  Diagnosis Date  . Diverticulitis of cecum 12/2009    Resected 2011:  COLON, SEGMENTAL  RESECTION, RIGHT AND TERMINAL ILEUM : - SEGMENT OF COLON  . Pleural effusion, left   . Kidney stones   . 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 Hypertension   . Diarrhea   . Abdominal pain   . Buzzing in ear   . Right leg numbness   . Chronic diarrhea 03/04/2012  . Colon polyp     PSH:   Past Surgical History  Procedure Laterality Date  . Cholecystectomy  12/2009    Dr. Excell Seltzer  . Appendectomy  2003    Dr. Marlou Starks:  Attempted laparoscopic and subsequent open appendectomy  . Right colectomy  12/2009    Dr Excell Seltzer: open resection for cecal diverticulitis  . Colon surgery    . Flexible sigmoidoscopy N/A 09/13/2013    Procedure: FLEXIBLE SIGMOIDOSCOPY;  Surgeon: Leighton Ruff, MD;  Location: [facility];  Service: Endoscopy;  Laterality: N/A;   Allergies:  Review of patient's allergies indicates no known allergies. Prior to Admit Meds:   Prescriptions prior to admission  Medication Sig Dispense Refill  . butalbital-acetaminophen-caffeine (FIORICET, ESGIC) 50-325-40 MG per tablet Take 1 tablet by mouth 2 (two) times daily as needed for migraine.       . cholestyramine (QUESTRAN) 4 GM/DOSE powder Take 1 g by mouth daily.      [name] Kitchen lisinopril (PRINIVIL,ZESTRIL) 10 MG tablet Take 10 mg by mouth daily.      [name] Kitchen loratadine (CLARITIN) 10 MG tablet Take 10 mg by mouth daily as needed for allergies.       Fam HX:    Family History  Problem Relation Age of Onset  . Cancer Mother  Social HX:    History   Social History  . Marital Status: Single    Spouse Name: N/A    Number of Children: N/A  . Years of Education: N/A   Occupational History  . unemployed    Social History Main Topics  . Smoking status: Former Smoker -- 0.50 packs/day for 20 years    Quit date: 12/01/2007  . Smokeless tobacco: Never Used  . Alcohol Use: No  . Drug Use: No  . Sexual Activity: Not on file   Other Topics Concern  . Not on file   Social History Narrative  . No narrative on file     Review of Systems: No  history of heart disease. Does have a positive PPD. No family history of heart disease. He denies palpitations and arrhythmia. No recent chest trauma. He does have low back disease/pain. No history of stroke or neurological abnormality.  Physical Exam: Blood pressure 158/90, pulse 73, temperature 97.8 F (36.6 C), temperature source Oral, resp. rate 14, height 5\' 3"  (1.6 m), weight 145 lb 8.1 oz (66 kg), SpO2 96.00%. Weight change:    Healthy-appearing gentleman in no acute distress. His family is present in the room and include a wife and 2 young daughters. He is comfortable. Skin is warm and dry. HEENT exam reveals no jaundice or pallor Chest is clear to auscultation and percussion. No rub is heard anteriorly or posteriorly particularly on left side. No wheezing is heard. No focal tenderness or rashes noted. Cardiac exam is normal. No rub, murmur, click, or gallop is heard. Abdomen is soft. Bowel sounds are normal. No bruits are heard. Extremities reveal no edema. Radial pulses and posterior tibial pulses are 2+ and symmetric. Neurological exam is unremarkable. Labs: Lab Results  Component Value Date   WBC 6.5 12/25/2013   HGB 14.0 12/25/2013   HCT 42.8 12/25/2013   MCV 95.5 12/25/2013   PLT 277 12/25/2013    Recent Labs Lab 12/25/13 1150  NA 140  K 3.6*  CL 104  CO2 23  BUN 8  CREATININE 0.81  CALCIUM 8.7  PROT 7.2  BILITOT 1.3*  ALKPHOS 75  ALT 18  AST 23  GLUCOSE 92    Radiology:  Dg Chest 2 View  12/25/2013   CLINICAL DATA:  Chest pain  EXAM: CHEST  2 VIEW  COMPARISON:  07/25/2013  FINDINGS: Normal heart size. There is a small left pleural effusion identified. Atelectasis and or airspace disease is noted in the left base. Right lung appears clear. The visualized bony structures appear normal.  IMPRESSION: Left lower lobe airspace opacity and small subpulmonic effusion.   Electronically Signed   By: Kerby Moors M.D.   On: 12/25/2013 12:18   EKG:  Normal sinus rhythm,  left axis deviation, incomplete right bundle branch block. No acute ST-T wave change.    Eduardo West 12/25/2013 6:08 PM

## 2013-12-25 NOTE — ED Notes (Addendum)
Per EMS- Pt comes from MD c/o CP x 2 weeks. Reports he has been SOB and pain to left chest and nipple area and has not been sleeping or eating because of the pain. (Pt speaks vietnamese, no english, this info was obtained from translator). EMS have pt 3 nitro and he reports it helped with pain, also 324 aspirin. Per MD at office pt has EKG changed of R. BBB. Pt skin is warm and dry. BP 140/100, HR 81. Pt had EKG done at this particular office in July/2014 with ventricular bigemy which is present today.

## 2013-12-26 ENCOUNTER — Observation Stay (HOSPITAL_COMMUNITY): Payer: Medicaid Other

## 2013-12-26 DIAGNOSIS — R0781 Pleurodynia: Secondary | ICD-10-CM

## 2013-12-26 DIAGNOSIS — R079 Chest pain, unspecified: Secondary | ICD-10-CM

## 2013-12-26 DIAGNOSIS — K219 Gastro-esophageal reflux disease without esophagitis: Secondary | ICD-10-CM

## 2013-12-26 DIAGNOSIS — J90 Pleural effusion, not elsewhere classified: Secondary | ICD-10-CM

## 2013-12-26 DIAGNOSIS — R7611 Nonspecific reaction to tuberculin skin test without active tuberculosis: Secondary | ICD-10-CM

## 2013-12-26 DIAGNOSIS — R0789 Other chest pain: Secondary | ICD-10-CM

## 2013-12-26 DIAGNOSIS — R071 Chest pain on breathing: Secondary | ICD-10-CM

## 2013-12-26 DIAGNOSIS — I517 Cardiomegaly: Secondary | ICD-10-CM

## 2013-12-26 DIAGNOSIS — J690 Pneumonitis due to inhalation of food and vomit: Secondary | ICD-10-CM

## 2013-12-26 LAB — BASIC METABOLIC PANEL
BUN: 11 mg/dL (ref 6–23)
CO2: 23 mEq/L (ref 19–32)
Calcium: 8.9 mg/dL (ref 8.4–10.5)
Chloride: 106 mEq/L (ref 96–112)
Creatinine, Ser: 0.97 mg/dL (ref 0.50–1.35)
GFR calc Af Amer: >90 mL/min (ref >90)
Glucose, Bld: 95 mg/dL (ref 70–99)
POTASSIUM: 3.9 meq/L (ref 3.7–5.3)
SODIUM: 143 meq/L (ref 137–147)

## 2013-12-26 LAB — INFLUENZA PANEL BY PCR (TYPE A & B)
H1N1FLUPCR: NOT DETECTED
INFLAPCR: NEGATIVE
INFLBPCR: NEGATIVE

## 2013-12-26 LAB — TROPONIN I: Troponin I: <0.3 ng/mL (ref <0.30)

## 2013-12-26 LAB — SEDIMENTATION RATE: Sed Rate: 2 mm/hr (ref 0–16)

## 2013-12-26 LAB — PROCALCITONIN

## 2013-12-26 LAB — MAGNESIUM: Magnesium: 2 mg/dL (ref 1.5–2.5)

## 2013-12-26 MED ORDER — POTASSIUM CHLORIDE CRYS ER 20 MEQ PO TBCR
40.0000 meq | EXTENDED_RELEASE_TABLET | Freq: Once | ORAL | Status: AC
  Administered 2013-12-26: 40 meq via ORAL
  Filled 2013-12-26: qty 2

## 2013-12-26 NOTE — Progress Notes (Signed)
[facility] PROGRESS NOTE  Eduardo West ZOX:[id number] DOB: 03/27/1961 DOA: 12/25/2013 PCP: Harvie Junior, MD  Assessment/Plan: 1-Atypical Chest pain; Troponin times 3 negative. EKG with bigeminy, left axis deviation. ECHO pending. Cardiology following. Chest x ray with Left lower lobe airspace opacity and small subpulmonic effusion. No cough, no leukocytosis. D dimer negative. Prior history PPD positive. I have ask pulmonary consultation for further recommendation.   2-Hypokalemia; repeat B-met. Replete k.   3-Hypertension: continue with lisinopril.  4-Chronic diarrhea: Continue Questran 5-Abnormal Chest X Ray; see number 1. Pulmonary consulted.   Code Status: Full Code.  Family Communication: Care discussed with patient with interpretor assistance.  Disposition Plan: To be determine.    Consultants:  Cardio  Pulmonary  Procedures:  none  Antibiotics:  none  HPI/Subjective: Feeling well this am. No chest pain. He has been having chest pain for last 2 weeks, at rest, on exertion. Denies cough.  He relates he has had in pass fluids on his lung, after he had surgery.  He relates that he has chronic diarrhea since 2011, after he had gallbladder surgery.    Objective: Filed Vitals:   12/26/13 0556  BP: 144/74  Pulse: 76  Temp: 98.1 F (36.7 C)  Resp: 15   No intake or output data in the 24 hours ending 12/26/13 0842 Filed Weights   12/25/13 1345  Weight: 66 kg (145 lb 8.1 oz)    Exam:   General:  No distress.   Cardiovascular: S 1, S 2 RRR  Respiratory: CTA  Abdomen: BS present, soft, NT, old midline scar.   Musculoskeletal:none  Data Reviewed: Basic Metabolic Panel:  Recent Labs Lab 12/25/13 1150  NA 140  K 3.6*  CL 104  CO2 23  GLUCOSE 92  BUN 8  CREATININE 0.81  CALCIUM 8.7   Liver Function Tests:  Recent Labs Lab 12/25/13 1150  AST 23  ALT 18  ALKPHOS 75  BILITOT 1.3*  PROT 7.2  ALBUMIN 4.0   No results found for  this basename: LIPASE, AMYLASE,  in the last 168 hours No results found for this basename: AMMONIA,  in the last 168 hours CBC:  Recent Labs Lab 12/25/13 1150  WBC 6.5  NEUTROABS 4.1  HGB 14.0  HCT 42.8  MCV 95.5  PLT 277   Cardiac Enzymes:  Recent Labs Lab 12/25/13 1931 12/25/13 2318 12/26/13 0433  TROPONINI <0.30 <0.30 <0.30   BNP (last 3 results)  Recent Labs  12/25/13 1150  PROBNP 41.7   CBG: No results found for this basename: GLUCAP,  in the last 168 hours  No results found for this or any previous visit (from the past 240 hour(s)).   Studies: Dg Chest 2 View  12/25/2013   CLINICAL DATA:  Chest pain  EXAM: CHEST  2 VIEW  COMPARISON:  07/25/2013  FINDINGS: Normal heart size. There is a small left pleural effusion identified. Atelectasis and or airspace disease is noted in the left base. Right lung appears clear. The visualized bony structures appear normal.  IMPRESSION: Left lower lobe airspace opacity and small subpulmonic effusion.   Electronically Signed   By: Kerby Moors M.D.   On: 12/25/2013 12:18    Scheduled Meds: . aspirin EC  325 mg Oral Daily  . cholestyramine  2 g Oral Daily  . enoxaparin (LOVENOX) injection  40 mg Subcutaneous Q24H  . famotidine  20 mg Oral Daily  . ibuprofen  600 mg Oral TID  . influenza vac split quadrivalent PF  0.5 mL Intramuscular Tomorrow-1000  . lisinopril  10 mg Oral Daily  . sodium chloride  3 mL Intravenous Q12H   Continuous Infusions: . sodium chloride 10 mL/hr (12/25/13 1724)    Principal Problem:   Chest pain, atypical Active Problems:   Chronic abdominal pain   Chronic diarrhea   ECG abnormality   Hypertension    Time spent: 25 minutes.     Eduardo West  [facility] Pager [phone]. If 7PM-7AM, please contact night-coverage at [email or web address], password [facility] 12/26/2013, 8:42 AM  LOS: 1 day

## 2013-12-26 NOTE — ED Provider Notes (Signed)
Medical screening examination/treatment/procedure(s) were performed by non-physician practitioner and as supervising physician I was immediately available for consultation/collaboration.  EKG Interpretation    Date/Time:    Ventricular Rate:    PR Interval:    QRS Duration:   QT Interval:    QTC Calculation:   R Axis:     Text Interpretation:               Jasper Riling. Alvino Chapel, MD 12/26/13 [phone]

## 2013-12-26 NOTE — Progress Notes (Signed)
  Echocardiogram 2D Echocardiogram has been performed.  Eduardo West 12/26/2013, 11:36 AM

## 2013-12-26 NOTE — Progress Notes (Signed)
UR completed 

## 2013-12-26 NOTE — Progress Notes (Signed)
Subjective: No further cheat pain.  No complaints today  Objective: Vital signs in last 24 hours: Temp:  [97.8 F (36.6 C)-98.4 F (36.9 C)] 98.1 F (36.7 C) (01/27 0556) Pulse Rate:  [36-79] 76 (01/27 0556) Resp:  [12-23] 15 (01/27 0556) BP: (115-159)/(74-99) 144/74 mmHg (01/27 0556) SpO2:  [96 %-100 %] 98 % (01/27 0556) Weight:  [145 lb 8.1 oz (66 kg)] 145 lb 8.1 oz (66 kg) (01/26 1345) Weight change:  Last BM Date: 12/25/13 Intake/Output from previous day:    Intake/Output this shift:    PE: General:Pleasant affect, NAD Skin:Warm and dry, brisk capillary refill HEENT:normocephalic, sclera clear, mucus membranes moist Neck:supple, no JVD Heart:S1S2 irreg with premature beats without murmur, gallup, rub or click Lungs:clear without rales, rhonchi, or wheezes ZMO:QHUT, non tender, + BS, do not palpate liver spleen or masses Ext:no lower ext edema, 2+ pedal pulses, 2+ radial pulses Neuro:alert and oriented, MAE, follows commands, + facial symmetry   Lab Results:  Recent Labs  12/25/13 1150  WBC 6.5  HGB 14.0  HCT 42.8  PLT 277   BMET  Recent Labs  12/25/13 1150  NA 140  K 3.6*  CL 104  CO2 23  GLUCOSE 92  BUN 8  CREATININE 0.81  CALCIUM 8.7    Recent Labs  12/25/13 2318 12/26/13 0433  TROPONINI <0.30 <0.30    No results found for this basename: CHOL, HDL, LDLCALC, LDLDIRECT, TRIG, CHOLHDL   No results found for this basename: HGBA1C     Lab Results  Component Value Date   TSH 1.12 05/06/2011    Hepatic Function Panel  Recent Labs  12/25/13 1150  PROT 7.2  ALBUMIN 4.0  AST 23  ALT 18  ALKPHOS 75  BILITOT 1.3*       Studies/Results: Dg Chest 2 View  12/25/2013   CLINICAL DATA:  Chest pain  EXAM: CHEST  2 VIEW  COMPARISON:  07/25/2013  FINDINGS: Normal heart size. There is a small left pleural effusion identified. Atelectasis and or airspace disease is noted in the left base. Right lung appears clear. The visualized  bony structures appear normal.  IMPRESSION: Left lower lobe airspace opacity and small subpulmonic effusion.   Electronically Signed   By: Kerby Moors M.D.   On: 12/25/2013 12:18    Medications: I have reviewed the patient's current medications. Scheduled Meds: . aspirin EC  325 mg Oral Daily  . cholestyramine  2 g Oral Daily  . enoxaparin (LOVENOX) injection  40 mg Subcutaneous Q24H  . famotidine  20 mg Oral Daily  . ibuprofen  600 mg Oral TID  . influenza vac split quadrivalent PF  0.5 mL Intramuscular Tomorrow-1000  . lisinopril  10 mg Oral Daily  . potassium chloride  40 mEq Oral Once  . sodium chloride  3 mL Intravenous Q12H   Continuous Infusions: . sodium chloride 10 mL/hr (12/25/13 1724)   PRN Meds:.acetaminophen, acetaminophen, albuterol, HYDROcodone-acetaminophen, loratadine, morphine injection, nitroGLYCERIN, ondansetron (ZOFRAN) IV, ondansetron  Assessment/Plan: Principal Problem:   Chest pain, atypical Active Problems:   Chronic abdominal pain   Chronic diarrhea   ECG abnormality   Hypertension  PLAN: presented with atypical chest pain, awaiting echo.  Negative MI, Pro bnp 41.7. Neg. Flu panel EKG today with big. PVCs check K+ and Mg+,   Neg D Dimer, Pul consult with hx of Lt pl. Effusion , tobacco hx and hx + PPD.  Pul eval. Now- possible chronic reflux  with aspiration.   LOS: 1 day   Time spent with pt. :15 minutes. [facility]  Nurse Practitioner Certified Pager [phone] or after 5pm and on weekends call [phone] 12/26/2013, 9:38 AM I agree with note above. Will await 2D Echo.  Daryel November, MD

## 2013-12-26 NOTE — Consult Note (Signed)
Name: Eduardo West MRN: [id number] DOB: Feb 13, 1961    ADMISSION DATE:  12/25/2013 CONSULTATION DATE:  12/26/2013  REFERRING MD :  [facility] PRIMARY SERVICE:  [facility]  CHIEF COMPLAINT:  Chest pain / pleural effusion   BRIEF PATIENT DESCRIPTION:   SIGNIFICANT EVENTS / STUDIES:  1/26  admitted with pleuritic chest pain x 2 weeks. CXR with LLL infiltrate and small effusion. 1/26  Esophagram>>>  LINES / TUBES:  CULTURES:  ANTIBIOTICS:  HISTORY OF PRESENT ILLNESS:   53 y.o. male [place] speaking male, was sent from PCPs office on 1/26 for evaluation of left-sided chest pain an abnormal EKG. Has multiple medical problems including hypertension, chronic diarrhea, PPD +, several surgeries-cholecystectomy, appendectomy, right colectomy for diverticulitis. CC:  approximately 2 week history of left-sided chest pain which was gradual in onset and has progressively gotten worse. He described the chest pain as sharp, moderate in intensity, radiates to the left infraaxillary region, worse with inspiration and at times palpating the chest, lasts 30-45 minutes and subsided spontaneously. He stated that he has dyspnea only during episodes of pain. He denied cough but did endorse that he's had high fevers and chills but has not recorded temperatures. No history of recent long-distance travel or sickly contacts. Did c/o sore throat. He states that his appetite is good and has chronic diarrhea which has not changed. Denied abdominal pain, nausea or vomiting. In the ED, lab work was unremarkable, chest x-ray was read as left lower lobe airspace opacity and small subpulmonic effusion, EKG showed left anterior fascicular block but did not show T-wave inversions seen in lead V 5-6 that was seen in PCPs office. He was admitted by IM service. Cardiology was consulted to eval CP and felt that this was non-cardiac in origin. Pulmonary was consulted for further evaluation.   Clinical Summary from [facility]   >last seen by [name] 2013: of note had been followed for left effusion first noted in 2011 after GI surgery. Tap yielded 600 cc exudate sample. Serial office visits by Dr Melvyn Novas show effusion to remain stable but pleuritic CP and chronic dyspnea followed conservatively.  >PPD positive Jan 2011. Came to [place] in 1992, PPD was positive at this time and completed 9 mo. INH, was sent back to Health dept in jan 2012, placed on: Isoniazid 300 Mg Tabs (Isoniazid) .[name] Kitchen.. 1 Once Daily , Pyrazinamide 500 Mg Tabs (Pyrazinamide) .... 3 Once Daily,  Myambutol 400 Mg Tabs (Ethambutol Hcl) .... 3 Once Daily  And Rifampin 300 Mg Caps (Rifampin) .... 2 Once Daily. He was ruled out as active TB eventually by the Health dept and treated from 1/12 to 5/12.   PAST MEDICAL HISTORY :  Past Medical History  Diagnosis Date  . Diverticulitis of cecum 12/2009    Resected 2011:  COLON, SEGMENTAL RESECTION, RIGHT AND TERMINAL ILEUM : - SEGMENT OF COLON  . Pleural effusion, left   . Kidney stones   . 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 Hypertension   . Diarrhea   . Abdominal pain   . Buzzing in ear   . Right leg numbness   . Chronic diarrhea 03/04/2012  . Colon polyp    Past Surgical History  Procedure Laterality Date  . Cholecystectomy  12/2009    Dr. Excell Seltzer  . Appendectomy  2003    Dr. Marlou Starks:  Attempted laparoscopic and subsequent open appendectomy  . Right colectomy  12/2009    Dr Excell Seltzer: open resection for cecal diverticulitis  . Colon surgery    .  Flexible sigmoidoscopy N/A 09/13/2013    Procedure: FLEXIBLE SIGMOIDOSCOPY;  Surgeon: Leighton Ruff, MD;  Location: [facility] ENDOSCOPY;  Service: Endoscopy;  Laterality: N/A;   Prior to Admission medications   Medication Sig Start Date End Date Taking? Authorizing Provider  butalbital-acetaminophen-caffeine (FIORICET, ESGIC) 50-325-40 MG per tablet Take 1 tablet by mouth 2 (two) times daily as needed for migraine.    Yes Historical Provider, MD  cholestyramine Lucrezia Starch) 4 GM/DOSE powder  Take 1 g by mouth daily.   Yes Historical Provider, MD  lisinopril (PRINIVIL,ZESTRIL) 10 MG tablet Take 10 mg by mouth daily.   Yes Historical Provider, MD  loratadine (CLARITIN) 10 MG tablet Take 10 mg by mouth daily as needed for allergies.   Yes Historical Provider, MD   No Known Allergies  FAMILY HISTORY:  Family History  Problem Relation Age of Onset  . Cancer Mother    SOCIAL HISTORY:  reports that he quit smoking about 6 years ago. He has never used smokeless tobacco. He reports that he does not drink alcohol or use illicit drugs.  Review of Systems:   Bolds are positive  Constitutional: weight loss, gain, night sweats, Fevers, chills, fatigue .  HEENT: headaches, Sore throat, sneezing, nasal congestion, post nasal drip, Difficulty swallowing, Tooth/dental problems, visual complaints visual changes, ear ache CV:  chest pain, radiates: ,Orthopnea, PND, swelling in lower extremities, dizziness, palpitations, syncope.  GI  heartburn, indigestion, abdominal pain, nausea, vomiting, diarrhea, change in bowel habits, loss of appetite, bloody stools.  Resp: cough, clear sputum, productive: , hemoptysis, dyspnea, chest pain, pleuritic.  Skin: rash or itching or icterus GU: dysuria, change in color of urine, urgency or frequency. flank pain, hematuria  MS: joint pain or swelling. decreased range of motion  Psych: change in mood or affect. depression or anxiety.  Neuro: difficulty with speech, weakness, numbness, ataxia     SUBJECTIVE:   VITAL SIGNS: Temp:  [97.8 F (36.6 C)-98.4 F (36.9 C)] 98.1 F (36.7 C) (01/27 0556) Pulse Rate:  [36-79] 76 (01/27 0556) Resp:  [12-23] 15 (01/27 0556) BP: (115-159)/(74-99) 144/74 mmHg (01/27 0556) SpO2:  [96 %-100 %] 98 % (01/27 0556) Weight:  [66 kg (145 lb 8.1 oz)] 66 kg (145 lb 8.1 oz) (01/26 1345)  PHYSICAL EXAMINATION: General:  53 year old male, no acute distress.  Neuro:  Non-focal  HEENT:  [place], no JVD  Cardiovascular:  rrr Lungs:   CTA, no accessory muscle use  Abdomen:  Soft, non-tender  Musculoskeletal:  Intact  Skin:  Intact    Recent Labs Lab 12/25/13 1150  NA 140  K 3.6*  CL 104  CO2 23  BUN 8  CREATININE 0.81  GLUCOSE 92    Recent Labs Lab 12/25/13 1150  HGB 14.0  HCT 42.8  WBC 6.5  PLT 277    Recent Labs Lab 12/25/13 1931 12/25/13 2318 12/26/13 0433  TROPONINI <0.30 <0.30 <0.30   Dg Chest 2 View  12/25/2013   CLINICAL DATA:  Chest pain  EXAM: CHEST  2 VIEW  COMPARISON:  07/25/2013  FINDINGS: Normal heart size. There is a small left pleural effusion identified. Atelectasis and or airspace disease is noted in the left base. Right lung appears clear. The visualized bony structures appear normal.  IMPRESSION: Left lower lobe airspace opacity and small subpulmonic effusion.   Electronically Signed   By: Kerby Moors M.D.   On: 12/25/2013 12:18   ASSESSMENT / PLAN: Severe GERD LLL infiltrate Pleuritic chest pain Chronic L pleural effusion (  radiographically not changed since 2013) H/o + PPD, s/p treatment twice: 1992 and 2012 ( by health department ) He endorses severe dyspepsia and reflux so severe that he wakes up from sleep. The reflux gastric bloating and GI complaints have been a issue since his surgeries back in 2011. Think that he is likely chronically aspirating and the acute on chronic LLL infiltrate represents aspiration.   Recommendation: Check PCT-->if elevated empiric abx Esophagram  Might need GI eval Reflux precautions Add back PPI Will hold off on further imaging Do not think this represents re-activation of TB No indications for thoracentesis  I have personally obtained history, examined patient, evaluated and interpreted laboratory and imaging results, reviewed medical records, formulated assessment / plan and placed orders.  Doree Fudge, MD [facility] [place] Pager: [phone]  12/26/2013, 10:55 AM

## 2013-12-27 ENCOUNTER — Observation Stay (HOSPITAL_COMMUNITY): Payer: Medicaid Other

## 2013-12-27 DIAGNOSIS — R079 Chest pain, unspecified: Secondary | ICD-10-CM

## 2013-12-27 DIAGNOSIS — R0789 Other chest pain: Secondary | ICD-10-CM

## 2013-12-27 MED ORDER — LEVOFLOXACIN 750 MG PO TABS
750.0000 mg | ORAL_TABLET | Freq: Every day | ORAL | Status: DC
  Administered 2013-12-27: 750 mg via ORAL
  Filled 2013-12-27: qty 1

## 2013-12-27 MED ORDER — LEVOFLOXACIN 750 MG PO TABS: 750.0000 mg | ORAL_TABLET | Freq: Every day | ORAL | Status: DC

## 2013-12-27 MED ORDER — PANTOPRAZOLE SODIUM 40 MG PO TBEC: 40.0000 mg | DELAYED_RELEASE_TABLET | Freq: Every day | ORAL | Status: DC

## 2013-12-27 NOTE — Discharge Summary (Addendum)
Physician Discharge Summary  Eduardo West JJH:[id number] DOB: 1961/07/08 DOA: 12/25/2013  PCP: Harvie Junior, MD  Admit date: 12/25/2013 Discharge date: 12/27/2013  Time spent: 45 minutes  Recommendations for Outpatient Follow-up:  -Will be discharged home today. -Advised to follow up with PCP in 2 weeks. -Will need to complete an 8 day course of levaquin.   Discharge Diagnoses:  Principal Problem:   Chest pain, atypical Active Problems:   Chronic abdominal pain   Chronic diarrhea   ECG abnormality   Hypertension   Aspiration pneumonia   Pleural effusion   Pleuritic chest pain   GERD (gastroesophageal reflux disease)   Discharge Condition: Stable and improved  Filed Weights   12/25/13 1345  Weight: 66 kg (145 lb 8.1 oz)    History of present illness:  53 y.o. male [place] speaking male, was sent from PCPs office for evaluation of left-sided chest pain an abnormal EKG. History was obtained from patient using interpreter phone. Patient has multiple medical problems including hypertension, chronic diarrhea, PPD +, several surgeries-cholecystectomy, appendectomy, right colectomy for diverticulitis. He gives vague complaints and generally not being well for years which he attributes to his multiple surgeries. In any event, he gives pproximately 2 week history of left-sided chest pain which was gradual in onset and has progressively gotten worse. He describes the chest pain as sharp, moderate in intensity, radiates to the left infraaxillary region, worse with inspiration and at times palpating the chest, lasts 30-45 minutes and subsides spontaneously. He states that he has dyspnea only during episodes of pain. He denies cough but states that he's had high fevers and chills but has not recorded temperatures. No history of recent long-distance travel or sickly contacts. Complaints of sore throat. He states that his appetite is good and has chronic diarrhea which has not changed. Denies  abdominal pain, nausea or vomiting. He complains of dizziness at times but no passing out. No skin rashes. In the ED, lab work was unremarkable, chest x-ray was read as left lower lobe airspace opacity and small subpulmonic effusion, EKG showed left anterior fascicular block but did not show T-wave inversions seen in lead V 5-6 that was seen in PCPs office. Hospitalist admission was requested.   Hospital Course:   1-Atypical Chest pain; Troponin times 3 negative. EKG with bigeminy, left axis deviation. ECHO WNL. Cardiology following and does not believe that further cardiac work up is necessary. Chest x ray with Left lower lobe airspace opacity and small subpulmonic effusion. No cough, no leukocytosis. I believe he needs to be treated for CAP with an 8 day course of levaquin. D dimer negative. He has a small left effusion. Has been seen in consultation by pulmonary and thoracentesis was not thought to be needed. Had a barium swallow that was consistent with chronic reflux. Has been started on a PPI.  2-Hypokalemia; repleted  3-Hypertension: continue with lisinopril. Well controlled.  4-Chronic diarrhea: Continue Questran      Procedures: ECHO:  Study Conclusions  - Left ventricle: The cavity size was normal. There was moderate concentric hypertrophy. Systolic function was normal. The estimated ejection fraction was in the range of 60% to 65%. Wall motion was normal; there were no regional wall motion abnormalities. Doppler parameters are consistent with abnormal left ventricular relaxation (grade 1 diastolic dysfunction).     Consultations:  Cardiology  Pulmonary  Discharge Instructions  Discharge Orders   Future Orders Complete By Expires   Discontinue IV  As directed    Increase activity slowly  As directed        Medication List    STOP taking these medications       butalbital-acetaminophen-caffeine 50-325-40 MG per tablet  Commonly known as:  FIORICET, ESGIC       TAKE these medications       cholestyramine 4 GM/DOSE powder  Commonly known as:  QUESTRAN  Take 1 g by mouth daily.     levofloxacin 750 MG tablet  Commonly known as:  LEVAQUIN  Take 1 tablet (750 mg total) by mouth daily. For 8 days     lisinopril 10 MG tablet  Commonly known as:  PRINIVIL,ZESTRIL  Take 10 mg by mouth daily.     loratadine 10 MG tablet  Commonly known as:  CLARITIN  Take 10 mg by mouth daily as needed for allergies.     pantoprazole 40 MG tablet  Commonly known as:  PROTONIX  Take 1 tablet (40 mg total) by mouth daily.       No Known Allergies     Follow-up Information   Follow up with Harvie Junior, MD. Schedule an appointment as soon as possible for a visit in 2 weeks.   Specialty:  Specialist   Contact information:   [address] [place] [address] [phone]        The results of significant diagnostics from this hospitalization (including imaging, microbiology, ancillary and laboratory) are listed below for reference.    Significant Diagnostic Studies: Dg Chest 2 View  12/27/2013   CLINICAL DATA:  Weakness, congestion, clinical concern of pneumonitis  EXAM: CHEST  2 VIEW  COMPARISON:  DG CHEST 2 VIEW dated 12/25/2013  FINDINGS: The heart size and mediastinal contours are within normal limits. There is slight increase conspicuity of the left lower lobe infiltrate. Blunting of the left costophrenic angle is appreciated stable when compared to the previous study. No new focal regions of consolidation or new focal infiltrates. Osseous structures unremarkable.  IMPRESSION: Stable left lower lobe infiltrate and stable small effusion.   Electronically Signed   By: Margaree Mackintosh M.D.   On: 12/27/2013 08:51   Dg Chest 2 View  12/25/2013   CLINICAL DATA:  Chest pain  EXAM: CHEST  2 VIEW  COMPARISON:  07/25/2013  FINDINGS: Normal heart size. There is a small left pleural effusion identified. Atelectasis and or airspace disease is noted in  the left base. Right lung appears clear. The visualized bony structures appear normal.  IMPRESSION: Left lower lobe airspace opacity and small subpulmonic effusion.   Electronically Signed   By: Kerby Moors M.D.   On: 12/25/2013 12:18   Dg Esophagus  12/26/2013   CLINICAL DATA:  Evaluate for esophageal reflux.  EXAM: ESOPHOGRAM / BARIUM SWALLOW / BARIUM TABLET STUDY  TECHNIQUE: Combined double contrast and single contrast examination performed using effervescent crystals, thick barium liquid, and thin barium liquid. The patient was observed with fluoroscopy swallowing a 93mm barium sulphate tablet.  COMPARISON:  No priors.  FLUOROSCOPY TIME:  1 min 42 seconds.  FINDINGS: Initial double contrast images of the esophagus demonstrated a normal appearance of the esophageal mucosa. Multiple single swallow attempts demonstrated a generally normal esophageal motility, however, intermittently, tertiary contractions were noted throughout the examination. Full column esophagram demonstrated no evidence of esophageal mass, stricture or esophageal ring. No hiatal hernia. Water siphon test was positive for a small amount of gastroesophageal reflux. Barium tablet was administered, which passed readily through the esophagus.  IMPRESSION: 1. Study was positive for  mild gastroesophageal reflux. 2. Occasional tertiary contractions were observed throughout the examination, however, esophageal motility was otherwise normal.   Electronically Signed   By: Vinnie Langton M.D.   On: 12/26/2013 18:11    Microbiology: No results found for this or any previous visit (from the past 240 hour(s)).   Labs: Basic Metabolic Panel:  Recent Labs Lab 12/25/13 1150 12/26/13 0945  NA 140 143  K 3.6* 3.9  CL 104 106  CO2 23 23  GLUCOSE 92 95  BUN 8 11  CREATININE 0.81 0.97  CALCIUM 8.7 8.9  MG  --  2.0   Liver Function Tests:  Recent Labs Lab 12/25/13 1150  AST 23  ALT 18  ALKPHOS 75  BILITOT 1.3*  PROT 7.2   ALBUMIN 4.0   No results found for this basename: LIPASE, AMYLASE,  in the last 168 hours No results found for this basename: AMMONIA,  in the last 168 hours CBC:  Recent Labs Lab 12/25/13 1150  WBC 6.5  NEUTROABS 4.1  HGB 14.0  HCT 42.8  MCV 95.5  PLT 277   Cardiac Enzymes:  Recent Labs Lab 12/25/13 1931 12/25/13 2318 12/26/13 0433  TROPONINI <0.30 <0.30 <0.30   BNP: BNP (last 3 results)  Recent Labs  12/25/13 1150  PROBNP 41.7   CBG: No results found for this basename: GLUCAP,  in the last 168 hours     Signed:  Lelon Frohlich  [facility] Pager: [phone] 12/27/2013, 11:06 AM

## 2013-12-27 NOTE — Progress Notes (Signed)
Patient ID: Eduardo West, male   DOB: 1961/04/27, 53 y.o.   MRN: [id number]    SUBJECTIVE: 2-D echo from yesterday revealed normal ejection fraction of 60%. There were no focal wall motion abnormalities.   Filed Vitals:   12/26/13 1705 12/26/13 1707 12/26/13 2101 12/27/13 0533  BP: 136/98 124/95 123/83 119/79  Pulse: 76 81 66 70  Temp:   97.6 F (36.4 C) 98 F (36.7 C)  TempSrc:   Oral Oral  Resp:    16  Height:      Weight:      SpO2: 100% 99% 97% 96%     Intake/Output Summary (Last 24 hours) at 12/27/13 0952 Last data filed at 12/26/13 2257  Gross per 24 hour  Intake    680 ml  Output      0 ml  Net    680 ml    LABS: Basic Metabolic Panel:  Recent Labs  12/25/13 1150 12/26/13 0945  NA 140 143  K 3.6* 3.9  CL 104 106  CO2 23 23  GLUCOSE 92 95  BUN 8 11  CREATININE 0.81 0.97  CALCIUM 8.7 8.9  MG  --  2.0   Liver Function Tests:  Recent Labs  12/25/13 1150  AST 23  ALT 18  ALKPHOS 75  BILITOT 1.3*  PROT 7.2  ALBUMIN 4.0   No results found for this basename: LIPASE, AMYLASE,  in the last 72 hours CBC:  Recent Labs  12/25/13 1150  WBC 6.5  NEUTROABS 4.1  HGB 14.0  HCT 42.8  MCV 95.5  PLT 277   Cardiac Enzymes:  Recent Labs  12/25/13 1931 12/25/13 2318 12/26/13 0433  TROPONINI <0.30 <0.30 <0.30   BNP: No components found with this basename: POCBNP,  D-Dimer:  Recent Labs  12/25/13 1931  DDIMER <0.27   Hemoglobin A1C: No results found for this basename: HGBA1C,  in the last 72 hours Fasting Lipid Panel: No results found for this basename: CHOL, HDL, LDLCALC, TRIG, CHOLHDL, LDLDIRECT,  in the last 72 hours Thyroid Function Tests: No results found for this basename: TSH, T4TOTAL, FREET3, T3FREE, THYROIDAB,  in the last 72 hours  RADIOLOGY: Dg Chest 2 View  12/27/2013   CLINICAL DATA:  Weakness, congestion, clinical concern of pneumonitis  EXAM: CHEST  2 VIEW  COMPARISON:  DG CHEST 2 VIEW dated 12/25/2013  FINDINGS: The heart size  and mediastinal contours are within normal limits. There is slight increase conspicuity of the left lower lobe infiltrate. Blunting of the left costophrenic angle is appreciated stable when compared to the previous study. No new focal regions of consolidation or new focal infiltrates. Osseous structures unremarkable.  IMPRESSION: Stable left lower lobe infiltrate and stable small effusion.   Electronically Signed   By: Eduardo West M.D.   On: 12/27/2013 08:51   Dg Chest 2 View  12/25/2013   CLINICAL DATA:  Chest pain  EXAM: CHEST  2 VIEW  COMPARISON:  07/25/2013  FINDINGS: Normal heart size. There is a small left pleural effusion identified. Atelectasis and or airspace disease is noted in the left base. Right lung appears clear. The visualized bony structures appear normal.  IMPRESSION: Left lower lobe airspace opacity and small subpulmonic effusion.   Electronically Signed   By: Eduardo West M.D.   On: 12/25/2013 12:18   Dg Esophagus  12/26/2013   CLINICAL DATA:  Evaluate for esophageal reflux.  EXAM: ESOPHOGRAM / BARIUM SWALLOW / BARIUM TABLET STUDY  TECHNIQUE: Combined double contrast  and single contrast examination performed using effervescent crystals, thick barium liquid, and thin barium liquid. The patient was observed with fluoroscopy swallowing a 63mm barium sulphate tablet.  COMPARISON:  No priors.  FLUOROSCOPY TIME:  1 min 42 seconds.  FINDINGS: Initial double contrast images of the esophagus demonstrated a normal appearance of the esophageal mucosa. Multiple single swallow attempts demonstrated a generally normal esophageal motility, however, intermittently, tertiary contractions were noted throughout the examination. Full column esophagram demonstrated no evidence of esophageal mass, stricture or esophageal ring. No hiatal hernia. Water siphon test was positive for a small amount of gastroesophageal reflux. Barium tablet was administered, which passed readily through the esophagus.   IMPRESSION: 1. Study was positive for mild gastroesophageal reflux. 2. Occasional tertiary contractions were observed throughout the examination, however, esophageal motility was otherwise normal.   Electronically Signed   By: Eduardo West M.D.   On: 12/26/2013 18:11    ASSESSMENT AND PLAN:    Chest pain, atypical     Left ventricular function is normal by 2-D echo. There has been no evidence of cardiac ischemia. Further cardiac workup is not recommended. Cardiology will sign off.    Chronic abdominal pain   Chronic diarrhea   ECG abnormality   Hypertension   Aspiration pneumonia   Pleural effusion   Pleuritic chest pain   GERD (gastroesophageal reflux disease)   Eduardo West 12/27/2013 9:52 AM

## 2014-01-24 ENCOUNTER — Emergency Department (HOSPITAL_COMMUNITY): Payer: Medicaid Other

## 2014-01-24 ENCOUNTER — Inpatient Hospital Stay (HOSPITAL_COMMUNITY)
Admission: EM | Admit: 2014-01-24 | Discharge: 2014-01-30 | DRG: 390 | Disposition: A | Discharge status: Home / Self Care | Payer: Medicaid Other | Attending: General Surgery | Admitting: General Surgery

## 2014-01-24 ENCOUNTER — Encounter (HOSPITAL_COMMUNITY): Payer: Self-pay | Admitting: Emergency Medicine

## 2014-01-24 DIAGNOSIS — Z79899 Other long term (current) drug therapy: Secondary | ICD-10-CM

## 2014-01-24 DIAGNOSIS — R109 Unspecified abdominal pain: Secondary | ICD-10-CM | POA: Diagnosis present

## 2014-01-24 DIAGNOSIS — K56609 Unspecified intestinal obstruction, unspecified as to partial versus complete obstruction: Secondary | ICD-10-CM | POA: Diagnosis present

## 2014-01-24 DIAGNOSIS — K219 Gastro-esophageal reflux disease without esophagitis: Secondary | ICD-10-CM | POA: Diagnosis present

## 2014-01-24 DIAGNOSIS — K529 Noninfective gastroenteritis and colitis, unspecified: Secondary | ICD-10-CM | POA: Diagnosis present

## 2014-01-24 DIAGNOSIS — Z8719 Personal history of other diseases of the digestive system: Secondary | ICD-10-CM

## 2014-01-24 DIAGNOSIS — Z860100 Personal history of colon polyps, unspecified: Secondary | ICD-10-CM

## 2014-01-24 DIAGNOSIS — R197 Diarrhea, unspecified: Secondary | ICD-10-CM | POA: Diagnosis present

## 2014-01-24 DIAGNOSIS — I498 Other specified cardiac arrhythmias: Secondary | ICD-10-CM | POA: Diagnosis present

## 2014-01-24 DIAGNOSIS — Z87442 Personal history of urinary calculi: Secondary | ICD-10-CM

## 2014-01-24 DIAGNOSIS — Z87891 Personal history of nicotine dependence: Secondary | ICD-10-CM

## 2014-01-24 DIAGNOSIS — I10 Essential (primary) hypertension: Secondary | ICD-10-CM | POA: Diagnosis present

## 2014-01-24 DIAGNOSIS — K5732 Diverticulitis of large intestine without perforation or abscess without bleeding: Secondary | ICD-10-CM | POA: Diagnosis present

## 2014-01-24 DIAGNOSIS — J690 Pneumonitis due to inhalation of food and vomit: Secondary | ICD-10-CM | POA: Diagnosis present

## 2014-01-24 DIAGNOSIS — R001 Bradycardia, unspecified: Secondary | ICD-10-CM

## 2014-01-24 DIAGNOSIS — G8929 Other chronic pain: Secondary | ICD-10-CM | POA: Diagnosis present

## 2014-01-24 LAB — CBC
HCT: 43 % (ref 39.0–52.0)
Hemoglobin: 13.9 g/dL (ref 13.0–17.0)
MCH: 30.5 pg (ref 26.0–34.0)
MCHC: 32.3 g/dL (ref 30.0–36.0)
MCV: 94.5 fL (ref 78.0–100.0)
PLATELETS: 272 10*3/uL (ref 150–400)
RBC: 4.55 MIL/uL (ref 4.22–5.81)
RDW: 12.2 % (ref 11.5–15.5)
WBC: 11.8 10*3/uL — ABNORMAL HIGH (ref 4.0–10.5)

## 2014-01-24 LAB — COMPREHENSIVE METABOLIC PANEL
ALT: 31 U/L (ref 0–53)
AST: 28 U/L (ref 0–37)
Albumin: 4.1 g/dL (ref 3.5–5.2)
Alkaline Phosphatase: 84 U/L (ref 39–117)
BUN: 8 mg/dL (ref 6–23)
CALCIUM: 9.4 mg/dL (ref 8.4–10.5)
CO2: 24 mEq/L (ref 19–32)
CREATININE: 0.76 mg/dL (ref 0.50–1.35)
Chloride: 100 mEq/L (ref 96–112)
GFR calc Af Amer: >90 mL/min (ref >90)
GFR calc non Af Amer: >90 mL/min (ref >90)
Glucose, Bld: 113 mg/dL — ABNORMAL HIGH (ref 70–99)
Potassium: 3.4 mEq/L — ABNORMAL LOW (ref 3.7–5.3)
Sodium: 139 mEq/L (ref 137–147)
Total Bilirubin: 1.9 mg/dL — ABNORMAL HIGH (ref 0.3–1.2)
Total Protein: 7.5 g/dL (ref 6.0–8.3)

## 2014-01-24 LAB — LIPASE, BLOOD: Lipase: 31 U/L (ref 11–59)

## 2014-01-24 MED ORDER — PANTOPRAZOLE SODIUM 40 MG IV SOLR
40.0000 mg | Freq: Every day | INTRAVENOUS | Status: DC
  Administered 2014-01-24 – 2014-01-29 (×6): 40 mg via INTRAVENOUS
  Filled 2014-01-24 (×7): qty 40

## 2014-01-24 MED ORDER — METRONIDAZOLE IN NACL 5-0.79 MG/ML-% IV SOLN
500.0000 mg | Freq: Three times a day (TID) | INTRAVENOUS | Status: DC
  Administered 2014-01-24 – 2014-01-30 (×17): 500 mg via INTRAVENOUS
  Filled 2014-01-24 (×19): qty 100

## 2014-01-24 MED ORDER — HEPARIN SODIUM (PORCINE) 5000 UNIT/ML IJ SOLN
5000.0000 [IU] | Freq: Three times a day (TID) | INTRAMUSCULAR | Status: DC
  Administered 2014-01-24 – 2014-01-30 (×17): 5000 [IU] via SUBCUTANEOUS
  Filled 2014-01-24 (×20): qty 1

## 2014-01-24 MED ORDER — SODIUM CHLORIDE 0.9 % IV BOLUS (SEPSIS)
1000.0000 mL | Freq: Once | INTRAVENOUS | Status: AC
  Administered 2014-01-24: 1000 mL via INTRAVENOUS

## 2014-01-24 MED ORDER — IOHEXOL 300 MG/ML  SOLN
100.0000 mL | Freq: Once | INTRAMUSCULAR | Status: AC | PRN
  Administered 2014-01-24: 100 mL via INTRAVENOUS

## 2014-01-24 MED ORDER — ONDANSETRON HCL 4 MG/2ML IJ SOLN
4.0000 mg | Freq: Four times a day (QID) | INTRAMUSCULAR | Status: DC | PRN
  Administered 2014-01-24: 4 mg via INTRAVENOUS
  Filled 2014-01-24 (×2): qty 2

## 2014-01-24 MED ORDER — ONDANSETRON HCL 4 MG/2ML IJ SOLN
4.0000 mg | Freq: Once | INTRAMUSCULAR | Status: AC
  Administered 2014-01-24: 4 mg via INTRAVENOUS
  Filled 2014-01-24: qty 2

## 2014-01-24 MED ORDER — HYDROMORPHONE HCL PF 1 MG/ML IJ SOLN
1.0000 mg | Freq: Once | INTRAMUSCULAR | Status: AC
  Administered 2014-01-24: 1 mg via INTRAVENOUS
  Filled 2014-01-24: qty 1

## 2014-01-24 MED ORDER — CIPROFLOXACIN IN D5W 400 MG/200ML IV SOLN
400.0000 mg | Freq: Two times a day (BID) | INTRAVENOUS | Status: DC
  Administered 2014-01-25 – 2014-01-30 (×12): 400 mg via INTRAVENOUS
  Filled 2014-01-24 (×13): qty 200

## 2014-01-24 MED ORDER — MORPHINE SULFATE 2 MG/ML IJ SOLN
2.0000 mg | INTRAMUSCULAR | Status: DC | PRN
  Administered 2014-01-24: 2 mg via INTRAVENOUS
  Filled 2014-01-24: qty 1

## 2014-01-24 MED ORDER — KCL IN DEXTROSE-NACL 20-5-0.9 MEQ/L-%-% IV SOLN
INTRAVENOUS | Status: DC
  Administered 2014-01-24: 23:00:00 via INTRAVENOUS
  Administered 2014-01-25: 100 mL/h via INTRAVENOUS
  Administered 2014-01-26 – 2014-01-27 (×3): via INTRAVENOUS
  Administered 2014-01-28 – 2014-01-29 (×2): 1000 mL via INTRAVENOUS
  Filled 2014-01-24 (×15): qty 1000

## 2014-01-24 MED ORDER — IOHEXOL 300 MG/ML  SOLN
50.0000 mL | Freq: Once | INTRAMUSCULAR | Status: AC | PRN
  Administered 2014-01-24: 50 mL via ORAL

## 2014-01-24 NOTE — ED Notes (Addendum)
Per EMS patient coming from home with c/o RLQ abdominal pain since midnight last night. Per EMS pt has hx of abdominal surgeries. Patient is non-english speaking and speaks vietnamese; pt given 4 mg zofran IV, reports some relief

## 2014-01-24 NOTE — ED Notes (Signed)
Bed: WA15 Expected date:  Expected time:  Means of arrival:  Comments: EMS  

## 2014-01-24 NOTE — ED Provider Notes (Signed)
CSN: [id number]     Arrival date & time 01/24/14  1803 History   First MD Initiated Contact with Patient 01/24/14 1852     Chief Complaint  Patient presents with  . Abdominal Pain  . Nausea  . Emesis     (Consider location/radiation/quality/duration/timing/severity/associated sxs/prior Treatment) HPI Comments: Patient is a 53 year old [place] male with a past medical history of diverticulitis of the cecum, resected in 2011, kidney stones, hypertension, cholecystectomy, appendectomy and colon polyps who presents to the emergency department complaining of sudden onset right lower course abdominal pain waking him up from sleep around midnight last night. Pain has been constant, radiating throughout his abdomen, worse with sitting and standing. He has not tried any alleviating factors. Admits to associated nausea with 4 episodes of nonbloody emesis and one episode of nonbloody diarrhea. He was given 4 mg Zofran via EMS with mild relief of his nausea. Denies fever, chills, chest pain or shortness of breath. States he feels his abdomen is swollen.  Patient is a 53 y.o. male presenting with abdominal pain and vomiting. The history is provided by the patient and a relative. The history is limited by a language barrier. A language interpreter was used.  Abdominal Pain Associated symptoms: diarrhea, nausea and vomiting   Emesis Associated symptoms: abdominal pain and diarrhea     Past Medical History  Diagnosis Date  . Diverticulitis of cecum 12/2009    Resected 2011:  COLON, SEGMENTAL RESECTION, RIGHT AND TERMINAL ILEUM : - SEGMENT OF COLON  . Pleural effusion, left   . Kidney stones   . 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 Hypertension   . Diarrhea   . Abdominal pain   . Buzzing in ear   . Right leg numbness   . Chronic diarrhea 03/04/2012  . Colon polyp    Past Surgical History  Procedure Laterality Date  . Cholecystectomy  12/2009    Dr. Excell Seltzer  . Appendectomy  2003    Dr. Marlou Starks:  Attempted  laparoscopic and subsequent open appendectomy  . Right colectomy  12/2009    Dr Excell Seltzer: open resection for cecal diverticulitis  . Colon surgery    . Flexible sigmoidoscopy N/A 09/13/2013    Procedure: FLEXIBLE SIGMOIDOSCOPY;  Surgeon: Leighton Ruff, MD;  Location: [facility];  Service: Endoscopy;  Laterality: N/A;   Family History  Problem Relation Age of Onset  . Cancer Mother    History  Substance Use Topics  . Smoking status: Former Smoker -- 0.50 packs/day for 20 years    Quit date: 12/01/2007  . Smokeless tobacco: Never Used  . Alcohol Use: No    Review of Systems  Gastrointestinal: Positive for nausea, vomiting, abdominal pain, diarrhea and abdominal distention.  All other systems reviewed and are negative.      Allergies  Review of patient's allergies indicates no known allergies.  Home Medications   Current Outpatient Rx  Name  Route  Sig  Dispense  Refill  . cholestyramine (QUESTRAN) 4 GM/DOSE powder   Oral   Take 1 g by mouth daily.         [name] Kitchen HYDROcodone-acetaminophen (VICODIN) 5-500 MG per tablet   Oral   Take 1 tablet by mouth daily as needed for pain.         [name] Kitchen lisinopril (PRINIVIL,ZESTRIL) 10 MG tablet   Oral   Take 10 mg by mouth daily.         . pantoprazole (PROTONIX) 40 MG tablet   Oral   Take 1  tablet (40 mg total) by mouth daily.   30 tablet   2    BP 151/93  Pulse 39  Temp(Src) 97.7 F (36.5 C) (Oral)  Resp 23  SpO2 97% Physical Exam  Nursing note and vitals reviewed. Constitutional: He is oriented to person, place, and time. He appears well-developed and well-nourished. No distress.  HENT:  Head: Normocephalic and atraumatic.  Mouth/Throat: Oropharynx is clear and moist.  Eyes: Conjunctivae are normal. No scleral icterus.  Neck: Normal range of motion. Neck supple.  Cardiovascular: Normal rate, regular rhythm, normal heart sounds and intact distal pulses.   Pulmonary/Chest: Effort normal and breath sounds normal.   Abdominal: Soft. Bowel sounds are normal. He exhibits distension (mild, lower abdomen). There is tenderness in the right lower quadrant. There is guarding. There is no rigidity and no rebound.  No peritoneal signs. Vertical surgical scar mid-abdomen, horizontal surgical scar RLQ.  Musculoskeletal: Normal range of motion. He exhibits no edema.  Neurological: He is alert and oriented to person, place, and time.  Skin: Skin is warm and dry. He is not diaphoretic.  Psychiatric: He has a normal mood and affect. His behavior is normal.    ED Course  Procedures (including critical care time) Labs Review Labs Reviewed  CBC - Abnormal; Notable for the following:    WBC 11.8 (*)    All other components within normal limits  COMPREHENSIVE METABOLIC PANEL - Abnormal; Notable for the following:    Potassium 3.4 (*)    Glucose, Bld 113 (*)    Total Bilirubin 1.9 (*)    All other components within normal limits  LIPASE, BLOOD   Imaging Review Ct Abdomen Pelvis W Contrast  01/24/2014   CLINICAL DATA:  53 year old male with right-sided abdominal and pelvic pain. History of diverticulitis and colonic surgery.  EXAM: CT ABDOMEN AND PELVIS WITH CONTRAST  TECHNIQUE: Multidetector CT imaging of the abdomen and pelvis was performed using the standard protocol following bolus administration of intravenous contrast.  CONTRAST:  100 cc intravenous Omnipaque 300  COMPARISON:  07/25/2013 and prior CTs  FINDINGS: Dilated mid small bowel loops are identified to a transition point in the anterior right upper pelvis compatible with a small bowel obstruction. Small bowel wall thickening/edema in this region is noted and may be reactive. No discrete obstructing causes identified in this may be secondary to an adhesion. A small amount of ascites is present primarily in a perihepatic location. There is no evidence of pneumoperitoneum or abscess.  Evidence of right colectomy noted.  Multiple hepatic cysts are present. Mild  bibasilar atelectasis/ scarring again noted.  The spleen, adrenal glands, pancreas are unremarkable.  Punctate nonobstructing bilateral renal calculi and small renal cysts are noted.  The patient is status post cholecystectomy.  There is no evidence of abdominal aortic aneurysm, biliary dilatation or enlarged lymph nodes.  The bladder is unremarkable.  No acute or suspicious bony abnormalities are identified.  IMPRESSION: Small bowel obstruction with transition point in the anterior right upper pelvis without definite obstructing cause - question adhesion. There is small bowel wall thickening/edema in this region which most likely is reactive. Small amount of free pelvic fluid. No evidence of pneumoperitoneum.  Evidence of right colectomy. Punctate nonobstructing bilateral renal calculi.   Electronically Signed   By: Hassan Rowan M.D.   On: 01/24/2014 21:03    EKG Interpretation   None       MDM   Final diagnoses:  Small bowel obstruction    Patient  presenting with sudden onset right lower quadrant abdominal pain, history of multiple abdominal surgeries. He appears in no apparent distress. Afebrile. Labs, CT with contrast pending. NPO, fluids, dilaudid and zofran. 9:38 PM Labs showing mild leukocytosis of 11.8. CT scan showing small bowel obstruction with transition point in the anterior right upper pelvis without definite obstructing cause, question adhesion. I spoke with Dr. Marlou Starks, [facility] who will evaluate patient for admission.  Pt admitted by Dr. Marlou Starks.  Case discussed with attending Dr. Aline Brochure who agrees with plan of care.   Illene Labrador, PA-C 01/25/14 0000

## 2014-01-24 NOTE — H&P (Signed)
Eduardo West is an 53 y.o. male.   Chief Complaint: abdominal pain HPI:  The pt is a 53 yo [place] male who has history of abdominal surgery. Over the last couple days he has had right sided abdominal pain with nausea and vomiting. He says he had some diarrhea earlier today. No fever. CT shows sbo  Past Medical History  Diagnosis Date  . Diverticulitis of cecum 12/2009    Resected 2011:  COLON, SEGMENTAL RESECTION, RIGHT AND TERMINAL ILEUM : - SEGMENT OF COLON  . Pleural effusion, left   . Kidney stones   . 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 Hypertension   . Diarrhea   . Abdominal pain   . Buzzing in ear   . Right leg numbness   . Chronic diarrhea 03/04/2012  . Colon polyp     Past Surgical History  Procedure Laterality Date  . Cholecystectomy  12/2009    Dr. Excell Seltzer  . Appendectomy  2003    Dr. Marlou Starks:  Attempted laparoscopic and subsequent open appendectomy  . Right colectomy  12/2009    Dr Excell Seltzer: open resection for cecal diverticulitis  . Colon surgery    . Flexible sigmoidoscopy N/A 09/13/2013    Procedure: FLEXIBLE SIGMOIDOSCOPY;  Surgeon: Leighton Ruff, MD;  Location: [facility];  Service: Endoscopy;  Laterality: N/A;    Family History  Problem Relation Age of Onset  . Cancer Mother    Social History:  reports that he quit smoking about 6 years ago. He has never used smokeless tobacco. He reports that he does not drink alcohol or use illicit drugs.  Allergies: No Known Allergies   (Not in a hospital admission)  Results for orders placed during the hospital encounter of 01/24/14 (from the past 48 hour(s))  CBC     Status: Abnormal   Collection Time    01/24/14  7:00 PM      Result Value Ref Range   WBC 11.8 (*) 4.0 - 10.5 K/uL   RBC 4.55  4.22 - 5.81 MIL/uL   Hemoglobin 13.9  13.0 - 17.0 g/dL   HCT 43.0  39.0 - 52.0 %   MCV 94.5  78.0 - 100.0 fL   MCH 30.5  26.0 - 34.0 pg   MCHC 32.3  30.0 - 36.0 g/dL   RDW 12.2  11.5 - 15.5 %   Platelets 272  150 - 400 K/uL   COMPREHENSIVE METABOLIC PANEL     Status: Abnormal   Collection Time    01/24/14  7:00 PM      Result Value Ref Range   Sodium 139  137 - 147 mEq/L   Potassium 3.4 (*) 3.7 - 5.3 mEq/L   Chloride 100  96 - 112 mEq/L   CO2 24  19 - 32 mEq/L   Glucose, Bld 113 (*) 70 - 99 mg/dL   BUN 8  6 - 23 mg/dL   Creatinine, Ser 0.76  0.50 - 1.35 mg/dL   Calcium 9.4  8.4 - 10.5 mg/dL   Total Protein 7.5  6.0 - 8.3 g/dL   Albumin 4.1  3.5 - 5.2 g/dL   AST 28  0 - 37 U/L   ALT 31  0 - 53 U/L   Alkaline Phosphatase 84  39 - 117 U/L   Total Bilirubin 1.9 (*) 0.3 - 1.2 mg/dL   GFR calc non Af Amer >90  >90 mL/min   GFR calc Af Amer >90  >90 mL/min   Comment: (NOTE)  The eGFR has been calculated using the CKD EPI equation.     This calculation has not been validated in all clinical situations.     eGFR's persistently <90 mL/min signify possible Chronic Kidney     Disease.  LIPASE, BLOOD     Status: None   Collection Time    01/24/14  7:00 PM      Result Value Ref Range   Lipase 31  11 - 59 U/L   Ct Abdomen Pelvis W Contrast  01/24/2014   CLINICAL DATA:  53 year old male with right-sided abdominal and pelvic pain. History of diverticulitis and colonic surgery.  EXAM: CT ABDOMEN AND PELVIS WITH CONTRAST  TECHNIQUE: Multidetector CT imaging of the abdomen and pelvis was performed using the standard protocol following bolus administration of intravenous contrast.  CONTRAST:  100 cc intravenous Omnipaque 300  COMPARISON:  07/25/2013 and prior CTs  FINDINGS: Dilated mid small bowel loops are identified to a transition point in the anterior right upper pelvis compatible with a small bowel obstruction. Small bowel wall thickening/edema in this region is noted and may be reactive. No discrete obstructing causes identified in this may be secondary to an adhesion. A small amount of ascites is present primarily in a perihepatic location. There is no evidence of pneumoperitoneum or abscess.  Evidence of right  colectomy noted.  Multiple hepatic cysts are present. Mild bibasilar atelectasis/ scarring again noted.  The spleen, adrenal glands, pancreas are unremarkable.  Punctate nonobstructing bilateral renal calculi and small renal cysts are noted.  The patient is status post cholecystectomy.  There is no evidence of abdominal aortic aneurysm, biliary dilatation or enlarged lymph nodes.  The bladder is unremarkable.  No acute or suspicious bony abnormalities are identified.  IMPRESSION: Small bowel obstruction with transition point in the anterior right upper pelvis without definite obstructing cause - question adhesion. There is small bowel wall thickening/edema in this region which most likely is reactive. Small amount of free pelvic fluid. No evidence of pneumoperitoneum.  Evidence of right colectomy. Punctate nonobstructing bilateral renal calculi.   Electronically Signed   By: Hassan Rowan M.D.   On: 01/24/2014 21:03    Review of Systems  Constitutional: Negative.   HENT: Negative.   Eyes: Negative.   Respiratory: Negative.   Cardiovascular: Negative.   Gastrointestinal: Positive for nausea, vomiting and abdominal pain.  Genitourinary: Negative.   Musculoskeletal: Negative.   Skin: Negative.   Neurological: Negative.   Endo/Heme/Allergies: Negative.   Psychiatric/Behavioral: Negative.     Blood pressure 130/92, pulse 80, temperature 97.5 F (36.4 C), temperature source Oral, resp. rate 15, SpO2 97.00%. Physical Exam  Constitutional: He is oriented to person, place, and time. He appears well-developed and well-nourished.  HENT:  Head: Normocephalic and atraumatic.  Eyes: Conjunctivae and EOM are normal. Pupils are equal, round, and reactive to light.  Neck: Normal range of motion. Neck supple.  Cardiovascular: Normal rate, regular rhythm and normal heart sounds.   Respiratory: Effort normal and breath sounds normal.  GI: Soft.  Mild tenderness on right. No guarding or peritonitis. Quiet. Well  healed midline scar  Musculoskeletal: Normal range of motion.  Neurological: He is alert and oriented to person, place, and time.  Skin: Skin is warm and dry.  Psychiatric: He has a normal mood and affect. His behavior is normal.     Assessment/Plan The pt appears to have an sbo probably secondary to previous surgery. Will admit for IV hydration, bowel rest  And monitor closely  TOTH III,Tajanay Hurley S 01/24/2014, 10:11 PM

## 2014-01-25 ENCOUNTER — Inpatient Hospital Stay (HOSPITAL_COMMUNITY): Payer: Medicaid Other

## 2014-01-25 DIAGNOSIS — I498 Other specified cardiac arrhythmias: Secondary | ICD-10-CM

## 2014-01-25 DIAGNOSIS — K56609 Unspecified intestinal obstruction, unspecified as to partial versus complete obstruction: Principal | ICD-10-CM

## 2014-01-25 LAB — CBC
HCT: 40.1 % (ref 39.0–52.0)
HEMOGLOBIN: 12.9 g/dL — AB (ref 13.0–17.0)
MCH: 30.8 pg (ref 26.0–34.0)
MCHC: 32.2 g/dL (ref 30.0–36.0)
MCV: 95.7 fL (ref 78.0–100.0)
Platelets: 256 10*3/uL (ref 150–400)
RBC: 4.19 MIL/uL — ABNORMAL LOW (ref 4.22–5.81)
RDW: 12.3 % (ref 11.5–15.5)
WBC: 8.1 10*3/uL (ref 4.0–10.5)

## 2014-01-25 LAB — PHOSPHORUS: Phosphorus: 3.1 mg/dL (ref 2.3–4.6)

## 2014-01-25 LAB — GLUCOSE, CAPILLARY
Glucose-Capillary: 113 mg/dL — ABNORMAL HIGH (ref 70–99)
Glucose-Capillary: 113 mg/dL — ABNORMAL HIGH (ref 70–99)

## 2014-01-25 LAB — BASIC METABOLIC PANEL
BUN: 8 mg/dL (ref 6–23)
CO2: 29 mEq/L (ref 19–32)
Calcium: 8.5 mg/dL (ref 8.4–10.5)
Chloride: 100 mEq/L (ref 96–112)
Creatinine, Ser: 0.98 mg/dL (ref 0.50–1.35)
Glucose, Bld: 134 mg/dL — ABNORMAL HIGH (ref 70–99)
Potassium: 3.3 mEq/L — ABNORMAL LOW (ref 3.7–5.3)
SODIUM: 139 meq/L (ref 137–147)

## 2014-01-25 LAB — MAGNESIUM: Magnesium: 2 mg/dL (ref 1.5–2.5)

## 2014-01-25 MED ORDER — POTASSIUM CHLORIDE 10 MEQ/100ML IV SOLN
10.0000 meq | INTRAVENOUS | Status: AC
  Administered 2014-01-25 – 2014-01-26 (×4): 10 meq via INTRAVENOUS
  Filled 2014-01-25 (×4): qty 100

## 2014-01-25 NOTE — ED Provider Notes (Signed)
Medical screening examination/treatment/procedure(s) were performed by non-physician practitioner and as supervising physician I was immediately available for consultation/collaboration.    Blanchard Kelch, MD 01/25/14 [id number]

## 2014-01-25 NOTE — Progress Notes (Signed)
[facility] Medical Consultation  Eduardo West ZOX:[id number] DOB: [birth year] DOA: 01/24/2014 PCP: Harvie Junior, MD   Requesting physician: Dr. Marcello Moores Date of consultation: 2/26 Reason for consultation: bradycardia  Impression/Recommendations Active Problems:   SBO (small bowel obstruction)  1. Bradycardia - I see heart rate is as low as 39 charted in Epic, however I do not see the strips and patient was not on telemetry at that time. Obained a 12-lead EKG today which showed PVCs but no bradycardia. Place on telemetry overnight to better assess if there are further events. Patient has a history of chest pains, and last time he was hospitalized a month ago cardiology evaluated him, he also had a 2-D echo with normal systolic ejection fraction and grade 1 diastolic dysfunction and no further cardiac workup was recommended at that time . 2. PVCs - also noted during last hospitalization. Monitor closely potassium and magnesium levels, especially since patient is n.p.o. given small bowel obstruction. Would recommend repeating potassium to 4 and magnesium to 2.  3. SBO - per surgical team  I will followup again tomorrow. Please contact me if I can be of assistance in the meanwhile. Thank you for this consultation.  Chief Complaint: Abdominal pain  HPI:  Patient admitted on the surgical service due to small bowel obstruction.  Review of Systems:  Patient currently denies any chest pain, palpitations, shortness of breath. He endorses right-sided abdominal pain which has accompanied him prior to hospitalization. He denies any fever or chills. Review of system otherwise negative.  Past Medical History  Diagnosis Date  . Diverticulitis of cecum 12/2009    Resected 2011:  COLON, SEGMENTAL RESECTION, RIGHT AND TERMINAL ILEUM : - SEGMENT OF COLON  . Pleural effusion, left   . Kidney stones   . 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 Hypertension   . Diarrhea   . Abdominal pain   . Buzzing in ear   .  Right leg numbness   . Chronic diarrhea 03/04/2012  . Colon polyp    Past Surgical History  Procedure Laterality Date  . Cholecystectomy  12/2009    Dr. Excell Seltzer  . Appendectomy  2003    Dr. Marlou Starks:  Attempted laparoscopic and subsequent open appendectomy  . Right colectomy  12/2009    Dr Excell Seltzer: open resection for cecal diverticulitis  . Colon surgery    . Flexible sigmoidoscopy N/A 09/13/2013    Procedure: FLEXIBLE SIGMOIDOSCOPY;  Surgeon: Leighton Ruff, MD;  Location: [facility];  Service: Endoscopy;  Laterality: N/A;   Social History:  reports that he quit smoking about 6 years ago. He has never used smokeless tobacco. He reports that he does not drink alcohol or use illicit drugs.  No Known Allergies Family History  Problem Relation Age of Onset  . Cancer Mother     Prior to Admission medications   Medication Sig Start Date End Date Taking? Authorizing Provider  cholestyramine (QUESTRAN) 4 GM/DOSE powder Take 1 g by mouth daily.   Yes Historical Provider, MD  HYDROcodone-acetaminophen (VICODIN) 5-500 MG per tablet Take 1 tablet by mouth daily as needed for pain.   Yes Historical Provider, MD  lisinopril (PRINIVIL,ZESTRIL) 10 MG tablet Take 10 mg by mouth daily.   Yes Historical Provider, MD  pantoprazole (PROTONIX) 40 MG tablet Take 1 tablet (40 mg total) by mouth daily. 12/27/13  Yes Erline Hau, MD   Physical Exam: Blood pressure 125/73, pulse 84, temperature 98.1 F (36.7 C), temperature source Oral, resp. rate 18, height 5'  3" (1.6 m), weight 67.6 kg (149 lb 0.5 oz), SpO2 97.00%. Filed Vitals:   01/25/14 0500  BP: 125/73  Pulse: 84  Temp: 98.1 F (36.7 C)  Resp: 18     General:  No acute distress  Eyes: No scleral icterus  Neck: No JVD  Cardiovascular: Regular rate, PVC present  Respiratory: Clear to auscultation bilaterally  Abdomen: Mild tenderness to palpation in the right lower quadrant  Skin: No rashes  Musculoskeletal: No peripheral  edema  Neurologic: Grossly non focal  Labs on Admission:  Basic Metabolic Panel:  Recent Labs Lab 01/24/14 1900 01/25/14 0520  NA 139 139  K 3.4* 3.3*  CL 100 100  CO2 24 29  GLUCOSE 113* 134*  BUN 8 8  CREATININE 0.76 0.98  CALCIUM 9.4 8.5  MG  --  2.0  PHOS  --  3.1   Liver Function Tests:  Recent Labs Lab 01/24/14 1900  AST 28  ALT 31  ALKPHOS 84  BILITOT 1.9*  PROT 7.5  ALBUMIN 4.1    Recent Labs Lab 01/24/14 1900  LIPASE 31   CBC:  Recent Labs Lab 01/24/14 1900 01/25/14 0520  WBC 11.8* 8.1  HGB 13.9 12.9*  HCT 43.0 40.1  MCV 94.5 95.7  PLT 272 256   CBG:  Recent Labs Lab 01/25/14 0811  GLUCAP 113*    Radiological Exams on Admission: Ct Abdomen Pelvis W Contrast  01/24/2014   CLINICAL DATA:  53 year old male with right-sided abdominal and pelvic pain. History of diverticulitis and colonic surgery.  EXAM: CT ABDOMEN AND PELVIS WITH CONTRAST  TECHNIQUE: Multidetector CT imaging of the abdomen and pelvis was performed using the standard protocol following bolus administration of intravenous contrast.  CONTRAST:  100 cc intravenous Omnipaque 300  COMPARISON:  07/25/2013 and prior CTs  FINDINGS: Dilated mid small bowel loops are identified to a transition point in the anterior right upper pelvis compatible with a small bowel obstruction. Small bowel wall thickening/edema in this region is noted and may be reactive. No discrete obstructing causes identified in this may be secondary to an adhesion. A small amount of ascites is present primarily in a perihepatic location. There is no evidence of pneumoperitoneum or abscess.  Evidence of right colectomy noted.  Multiple hepatic cysts are present. Mild bibasilar atelectasis/ scarring again noted.  The spleen, adrenal glands, pancreas are unremarkable.  Punctate nonobstructing bilateral renal calculi and small renal cysts are noted.  The patient is status post cholecystectomy.  There is no evidence of abdominal  aortic aneurysm, biliary dilatation or enlarged lymph nodes.  The bladder is unremarkable.  No acute or suspicious bony abnormalities are identified.  IMPRESSION: Small bowel obstruction with transition point in the anterior right upper pelvis without definite obstructing cause - question adhesion. There is small bowel wall thickening/edema in this region which most likely is reactive. Small amount of free pelvic fluid. No evidence of pneumoperitoneum.  Evidence of right colectomy. Punctate nonobstructing bilateral renal calculi.   Electronically Signed   By: Hassan Rowan M.D.   On: 01/24/2014 21:03   Dg Abd Portable 2v  01/25/2014   CLINICAL DATA:  Evaluate small bowel obstruction.  EXAM: PORTABLE ABDOMEN - 2 VIEW  COMPARISON:  CT 01/24/2014 and abdominal films 10/12/2012  FINDINGS: There are surgical clips over the right upper quadrant as well as a surgical suture line over the right mid abdomen likely from patient's previous right colectomy. Bowel gas pattern is nonobstructive. There is a mild amount of  air throughout the colon. No definite dilated small bowel loops or air-fluid levels. No free peritoneal air. Minimal contrast within the bladder. Remainder the exam is unchanged.  IMPRESSION: Nonspecific, nonobstructive bowel gas pattern.   Electronically Signed   By: Marin Olp M.D.   On: 01/25/2014 07:53   EKG: Independently reviewed. Sinus rhythm with PVCs (looks like bigeminy)  Time spent: [place], [place] [facility] Pager [phone]  If 7PM-7AM, please contact night-coverage [email or web address] Password [facility] 01/25/2014, 12:59 PM

## 2014-01-25 NOTE — Progress Notes (Signed)
<principal problem not specified>  Subjective: Feels about the same today.  NG with 649ml out overnight.  Objective: Vital signs in last 24 hours: Temp:  [97.5 F (36.4 C)-98.3 F (36.8 C)] 98.1 F (36.7 C) (02/26 0500) Pulse Rate:  [39-84] 84 (02/26 0500) Resp:  [12-23] 18 (02/26 0500) BP: (125-180)/(73-98) 125/73 mmHg (02/26 0500) SpO2:  [96 %-100 %] 97 % (02/26 0500) Weight:  [149 lb 0.5 oz (67.6 kg)] 149 lb 0.5 oz (67.6 kg) (02/25 2258) Last BM Date: 01/24/14  Intake/Output from previous day: 02/25 0701 - 02/26 0700 In: 826.7 [I.V.:426.7; IV Piggyback:400] Out: 1125 [Urine:225; Emesis/NG output:900] Intake/Output this shift:    General appearance: alert and cooperative GI: normal findings: soft, non-tender, slightly distended   Lab Results:  Results for orders placed during the hospital encounter of 01/24/14 (from the past 24 hour(s))  CBC     Status: Abnormal   Collection Time    01/24/14  7:00 PM      Result Value Ref Range   WBC 11.8 (*) 4.0 - 10.5 K/uL   RBC 4.55  4.22 - 5.81 MIL/uL   Hemoglobin 13.9  13.0 - 17.0 g/dL   HCT 43.0  39.0 - 52.0 %   MCV 94.5  78.0 - 100.0 fL   MCH 30.5  26.0 - 34.0 pg   MCHC 32.3  30.0 - 36.0 g/dL   RDW 12.2  11.5 - 15.5 %   Platelets 272  150 - 400 K/uL  COMPREHENSIVE METABOLIC PANEL     Status: Abnormal   Collection Time    01/24/14  7:00 PM      Result Value Ref Range   Sodium 139  137 - 147 mEq/L   Potassium 3.4 (*) 3.7 - 5.3 mEq/L   Chloride 100  96 - 112 mEq/L   CO2 24  19 - 32 mEq/L   Glucose, Bld 113 (*) 70 - 99 mg/dL   BUN 8  6 - 23 mg/dL   Creatinine, Ser 0.76  0.50 - 1.35 mg/dL   Calcium 9.4  8.4 - 10.5 mg/dL   Total Protein 7.5  6.0 - 8.3 g/dL   Albumin 4.1  3.5 - 5.2 g/dL   AST 28  0 - 37 U/L   ALT 31  0 - 53 U/L   Alkaline Phosphatase 84  39 - 117 U/L   Total Bilirubin 1.9 (*) 0.3 - 1.2 mg/dL   GFR calc non Af Amer >90  >90 mL/min   GFR calc Af Amer >90  >90 mL/min  LIPASE, BLOOD     Status: None   Collection Time    01/24/14  7:00 PM      Result Value Ref Range   Lipase 31  11 - 59 U/L  BASIC METABOLIC PANEL     Status: Abnormal   Collection Time    01/25/14  5:20 AM      Result Value Ref Range   Sodium 139  137 - 147 mEq/L   Potassium 3.3 (*) 3.7 - 5.3 mEq/L   Chloride 100  96 - 112 mEq/L   CO2 29  19 - 32 mEq/L   Glucose, Bld 134 (*) 70 - 99 mg/dL   BUN 8  6 - 23 mg/dL   Creatinine, Ser 0.98  0.50 - 1.35 mg/dL   Calcium 8.5  8.4 - 10.5 mg/dL   GFR calc non Af Amer >90  >90 mL/min   GFR calc Af Amer >90  >90 mL/min  CBC     Status: Abnormal   Collection Time    01/25/14  5:20 AM      Result Value Ref Range   WBC 8.1  4.0 - 10.5 K/uL   RBC 4.19 (*) 4.22 - 5.81 MIL/uL   Hemoglobin 12.9 (*) 13.0 - 17.0 g/dL   HCT 40.1  39.0 - 52.0 %   MCV 95.7  78.0 - 100.0 fL   MCH 30.8  26.0 - 34.0 pg   MCHC 32.2  30.0 - 36.0 g/dL   RDW 12.3  11.5 - 15.5 %   Platelets 256  150 - 400 K/uL     Studies/Results Radiology     MEDS, Scheduled . ciprofloxacin  400 mg Intravenous Q12H  . heparin  5,000 Units Subcutaneous 3 times per day  . metronidazole  500 mg Intravenous 3 times per day  . pantoprazole (PROTONIX) IV  40 mg Intravenous QHS     Assessment: <principal problem not specified> SBO  Plan: Cont NGT and NPO   LOS: 1 day    Rosario Adie, MD [facility], [place]   01/25/2014 7:51 AM

## 2014-01-25 NOTE — Evaluation (Signed)
Physical Therapy Evaluation-1x Patient Details Name: Eduardo West MRN: [id number] DOB: [birth year] Today's Date: 01/25/2014 Time: 4166-0630 PT Time Calculation (min): 30 min  PT Assessment / Plan / Recommendation History of Present Illness  53 yo male admitted with SBO. Pt is Vietnamese-speaking.   Clinical Impression  On eval pt was Mod Ind with mobility-able to ambulate at least 300 feet without assistive device. No LOB. No follow up PT needs at this time. Recommend daily mobility with nursing supervision. 1x eval. PT will sign off.     PT Assessment  Patent does not need any further PT services    Follow Up Recommendations  No PT follow up    Does the patient have the potential to tolerate intense rehabilitation      Barriers to Discharge        Equipment Recommendations  None recommended by PT    Recommendations for Other Services     Frequency      Precautions / Restrictions Precautions Precautions: None Restrictions Weight Bearing Restrictions: No   Pertinent Vitals/Pain Pt denied pain      Mobility  Bed Mobility Overal bed mobility: Independent Transfers Overall transfer level: Independent Ambulation/Gait Ambulation/Gait assistance: Modified independent (Device/Increase time) Ambulation Distance (Feet): 350 Feet Assistive device: None General Gait Details: no LOB. Mild gait abnormality R LE .     Exercises     PT Diagnosis:    PT Problem List:   PT Treatment Interventions:       PT Goals(Current goals can be found in the care plan section) Acute Rehab PT Goals Patient Stated Goal: none stated PT Goal Formulation: No goals set, d/c therapy  Visit Information  Last PT Received On: 01/25/14 History of Present Illness: 53 yo male admitted with SBO. Pt is Vietnamese-speaking.        Prior [place] expects to be discharged to:: Private residence Living Arrangements: Spouse/significant other;Children Type of Home:  House Home Access: Level entry Home Layout: One level Home Equipment: None Prior Function Level of Independence: Independent Communication Communication: Prefers language other than English    Cognition  Cognition Arousal/Alertness: Awake/alert Behavior During Therapy: WFL for tasks assessed/performed Overall Cognitive Status: Within Functional Limits for tasks assessed    Extremity/Trunk Assessment Upper Extremity Assessment Upper Extremity Assessment: Overall WFL for tasks assessed Lower Extremity Assessment Lower Extremity Assessment: Overall WFL for tasks assessed (noted increased R LE external rotation during gait. ) Cervical / Trunk Assessment Cervical / Trunk Assessment: Normal   Balance Balance Overall balance assessment: No apparent balance deficits (not formally assessed)  End of Session PT - End of Session Activity Tolerance: Patient tolerated treatment well Patient left: in bed;with call bell/phone within reach  GP     Weston Anna, MPT Pager: [phone]

## 2014-01-26 ENCOUNTER — Inpatient Hospital Stay (HOSPITAL_COMMUNITY): Payer: Medicaid Other

## 2014-01-26 LAB — GLUCOSE, CAPILLARY
GLUCOSE-CAPILLARY: 105 mg/dL — AB (ref 70–99)
GLUCOSE-CAPILLARY: 113 mg/dL — AB (ref 70–99)
GLUCOSE-CAPILLARY: 95 mg/dL (ref 70–99)
GLUCOSE-CAPILLARY: 99 mg/dL (ref 70–99)
Glucose-Capillary: 105 mg/dL — ABNORMAL HIGH (ref 70–99)
Glucose-Capillary: 94 mg/dL (ref 70–99)
Glucose-Capillary: 103 mg/dL — ABNORMAL HIGH (ref 70–99)

## 2014-01-26 LAB — MAGNESIUM: Magnesium: 2.1 mg/dL (ref 1.5–2.5)

## 2014-01-26 LAB — COMPREHENSIVE METABOLIC PANEL
ALT: 29 U/L (ref 0–53)
AST: 28 U/L (ref 0–37)
Albumin: 3.6 g/dL (ref 3.5–5.2)
Alkaline Phosphatase: 73 U/L (ref 39–117)
BUN: 7 mg/dL (ref 6–23)
CALCIUM: 8.9 mg/dL (ref 8.4–10.5)
CHLORIDE: 101 meq/L (ref 96–112)
CO2: 29 mEq/L (ref 19–32)
Creatinine, Ser: 1.08 mg/dL (ref 0.50–1.35)
GFR, EST NON AFRICAN AMERICAN: 78 mL/min — AB (ref >90)
Glucose, Bld: 105 mg/dL — ABNORMAL HIGH (ref 70–99)
POTASSIUM: 3.4 meq/L — AB (ref 3.7–5.3)
Sodium: 140 mEq/L (ref 137–147)
TOTAL PROTEIN: 6.4 g/dL (ref 6.0–8.3)
Total Bilirubin: 1.9 mg/dL — ABNORMAL HIGH (ref 0.3–1.2)

## 2014-01-26 LAB — PHOSPHORUS: PHOSPHORUS: 2.1 mg/dL — AB (ref 2.3–4.6)

## 2014-01-26 MED ORDER — PHENOL 1.4 % MT LIQD: 2.0000 | OROMUCOSAL | Status: DC | PRN

## 2014-01-26 MED ORDER — ALUM & MAG HYDROXIDE-SIMETH 200-200-20 MG/5ML PO SUSP: 30.0000 mL | Freq: Four times a day (QID) | ORAL | Status: DC | PRN

## 2014-01-26 MED ORDER — DIPHENHYDRAMINE HCL 50 MG/ML IJ SOLN: 12.5000 mg | Freq: Four times a day (QID) | INTRAMUSCULAR | Status: DC | PRN

## 2014-01-26 MED ORDER — MENTHOL 3 MG MT LOZG: 1.0000 | LOZENGE | OROMUCOSAL | Status: DC | PRN

## 2014-01-26 MED ORDER — PROMETHAZINE HCL 25 MG/ML IJ SOLN: 6.2500 mg | Freq: Four times a day (QID) | INTRAMUSCULAR | Status: DC | PRN

## 2014-01-26 MED ORDER — LACTATED RINGERS IV BOLUS (SEPSIS): 1000.0000 mL | Freq: Three times a day (TID) | INTRAVENOUS | Status: AC | PRN

## 2014-01-26 MED ORDER — ACETAMINOPHEN 650 MG RE SUPP: 650.0000 mg | Freq: Four times a day (QID) | RECTAL | Status: DC | PRN

## 2014-01-26 MED ORDER — MAGIC MOUTHWASH
15.0000 mL | Freq: Four times a day (QID) | ORAL | Status: DC | PRN
  Filled 2014-01-26: qty 15

## 2014-01-26 MED ORDER — BISACODYL 10 MG RE SUPP: 10.0000 mg | Freq: Two times a day (BID) | RECTAL | Status: DC | PRN

## 2014-01-26 MED ORDER — LIP MEDEX EX OINT
1.0000 "application " | TOPICAL_OINTMENT | Freq: Two times a day (BID) | CUTANEOUS | Status: DC
  Administered 2014-01-26 – 2014-01-29 (×5): 1 via TOPICAL
  Filled 2014-01-26: qty 7

## 2014-01-26 MED ORDER — METOPROLOL TARTRATE 1 MG/ML IV SOLN
5.0000 mg | Freq: Four times a day (QID) | INTRAVENOUS | Status: DC | PRN
  Filled 2014-01-26: qty 5

## 2014-01-26 MED ORDER — MORPHINE SULFATE 2 MG/ML IJ SOLN: 1.0000 mg | INTRAMUSCULAR | Status: DC | PRN

## 2014-01-26 NOTE — Progress Notes (Signed)
[facility] PROGRESS NOTE  Eduardo West:[id number] DOB: [birth year] DOA: 01/24/2014 PCP: Harvie Junior, MD  Assessment/Plan: 1. Bradycardia. I suspect secondary to medications, may have been related to narcotic analgesics he received for his abdominal pain. EKG showing sinus rhythm with frequent PVCs. Telemetry this morning reporting no changes, remaining in sinus rhythm in the 60s to 80s range. Patient currently denies chest pain, palpitations, dizziness, lightheadedness or shortness of breath. He does not appear to have active cardiac issues. Would not recommend further studies/Interventions with his present heart rates. It appears he had a transthoracic echocardiogram that was performed on 12/26/2013 showing an ejection fraction of 60-65% without wall motion abnormalities. He did have grade 1 diastolic dysfunction noted.  Will sign off, thank you very much for this consultation, please call with any questions or concerns    HPI/Subjective: Patient is a pleasant 53 year old gentleman with a past medical history of abdominal surgery, without cardiac history who was admitted to the surgical service on 01/24/2014 presenting with complaints of right-sided abdominal pain associated with nausea and vomiting. A CT scan of abdomen and pelvis on admission showed small bowel obstruction. On presentation patient found to be bradycardic with heart rates in the low 40s, medicine was consulted for evaluation. EKG showed sinus rhythm with the presence of PVCs. Patient's heart rates in the 60s to 80s this morning. Telemetry revealed, no significant events occurring.  Objective: Filed Vitals:   01/26/14 0616  BP: 148/93  Pulse: 82  Temp: 98.2 F (36.8 C)  Resp:    No intake or output data in the 24 hours ending 01/26/14 1334 Filed Weights   01/24/14 2258  Weight: 67.6 kg (149 lb 0.5 oz)    Exam:   General:  No acute distress, awake and alert, NG tube is in place  Cardiovascular:  Regular rate and rhythm normal S1-S2  Respiratory: Normal respiratory effort, lungs are clear to auscultation bilaterally  Abdomen: Appears mildly distended, he reports pain to palpation over right side of abdomen, no guarding or rebound tenderness.  Musculoskeletal: No edema  Data Reviewed: Basic Metabolic Panel:  Recent Labs Lab 01/24/14 1900 01/25/14 0520 01/26/14 0539  NA 139 139 140  K 3.4* 3.3* 3.4*  CL 100 100 101  CO2 24 29 29   GLUCOSE 113* 134* 105*  BUN 8 8 7   CREATININE 0.76 0.98 1.08  CALCIUM 9.4 8.5 8.9  MG  --  2.0 2.1  PHOS  --  3.1 2.1*   Liver Function Tests:  Recent Labs Lab 01/24/14 1900 01/26/14 0539  AST 28 28  ALT 31 29  ALKPHOS 84 73  BILITOT 1.9* 1.9*  PROT 7.5 6.4  ALBUMIN 4.1 3.6    Recent Labs Lab 01/24/14 1900  LIPASE 31   No results found for this basename: AMMONIA,  in the last 168 hours CBC:  Recent Labs Lab 01/24/14 1900 01/25/14 0520  WBC 11.8* 8.1  HGB 13.9 12.9*  HCT 43.0 40.1  MCV 94.5 95.7  PLT 272 256   Cardiac Enzymes: No results found for this basename: CKTOTAL, CKMB, CKMBINDEX, TROPONINI,  in the last 168 hours BNP (last 3 results)  Recent Labs  12/25/13 1150  PROBNP 41.7   CBG:  Recent Labs Lab 01/25/14 1644 01/25/14 2035 01/25/14 2355 01/26/14 0352 01/26/14 0744  GLUCAP 95 105* 113* 94 105*    No results found for this or any previous visit (from the past 240 hour(s)).   Studies: Ct Abdomen Pelvis W Contrast  01/24/2014  CLINICAL DATA:  53 year old male with right-sided abdominal and pelvic pain. History of diverticulitis and colonic surgery.  EXAM: CT ABDOMEN AND PELVIS WITH CONTRAST  TECHNIQUE: Multidetector CT imaging of the abdomen and pelvis was performed using the standard protocol following bolus administration of intravenous contrast.  CONTRAST:  100 cc intravenous Omnipaque 300  COMPARISON:  07/25/2013 and prior CTs  FINDINGS: Dilated mid small bowel loops are identified to a  transition point in the anterior right upper pelvis compatible with a small bowel obstruction. Small bowel wall thickening/edema in this region is noted and may be reactive. No discrete obstructing causes identified in this may be secondary to an adhesion. A small amount of ascites is present primarily in a perihepatic location. There is no evidence of pneumoperitoneum or abscess.  Evidence of right colectomy noted.  Multiple hepatic cysts are present. Mild bibasilar atelectasis/ scarring again noted.  The spleen, adrenal glands, pancreas are unremarkable.  Punctate nonobstructing bilateral renal calculi and small renal cysts are noted.  The patient is status post cholecystectomy.  There is no evidence of abdominal aortic aneurysm, biliary dilatation or enlarged lymph nodes.  The bladder is unremarkable.  No acute or suspicious bony abnormalities are identified.  IMPRESSION: Small bowel obstruction with transition point in the anterior right upper pelvis without definite obstructing cause - question adhesion. There is small bowel wall thickening/edema in this region which most likely is reactive. Small amount of free pelvic fluid. No evidence of pneumoperitoneum.  Evidence of right colectomy. Punctate nonobstructing bilateral renal calculi.   Electronically Signed   By: Hassan Rowan M.D.   On: 01/24/2014 21:03   Dg Abd 2 Views  01/26/2014   CLINICAL DATA:  Small bowel obstruction, followup  EXAM: ABDOMEN - 2 VIEW  COMPARISON:  Abdomen films of 01/25/2014  FINDINGS: The tip of the NG tube courses into the region of the expected descending duodenum, in good position. The bowel gas pattern is nonspecific. No free air is seen on the erect view. There is mild left basilar atelectasis present with a probable small left pleural effusion.  IMPRESSION: Tip of NG tube in the descending duodenum. No bowel obstruction. No free air.   Electronically Signed   By: Ivar Drape M.D.   On: 01/26/2014 10:36   Dg Abd Portable  2v  01/25/2014   CLINICAL DATA:  Evaluate small bowel obstruction.  EXAM: PORTABLE ABDOMEN - 2 VIEW  COMPARISON:  CT 01/24/2014 and abdominal films 10/12/2012  FINDINGS: There are surgical clips over the right upper quadrant as well as a surgical suture line over the right mid abdomen likely from patient's previous right colectomy. Bowel gas pattern is nonobstructive. There is a mild amount of air throughout the colon. No definite dilated small bowel loops or air-fluid levels. No free peritoneal air. Minimal contrast within the bladder. Remainder the exam is unchanged.  IMPRESSION: Nonspecific, nonobstructive bowel gas pattern.   Electronically Signed   By: Marin Olp M.D.   On: 01/25/2014 07:53    Scheduled Meds: . ciprofloxacin  400 mg Intravenous Q12H  . heparin  5,000 Units Subcutaneous 3 times per day  . metronidazole  500 mg Intravenous 3 times per day  . pantoprazole (PROTONIX) IV  40 mg Intravenous QHS   Continuous Infusions: . dextrose 5 % and 0.9 % NaCl with KCl 20 mEq/L 100 mL/hr at 01/26/14 1109    Active Problems:   SBO (small bowel obstruction)    Time spent: 25 min  Kelvin Cellar  [facility] Pager [phone]. If 7PM-7AM, please contact night-coverage at [email or web address], password [facility] 01/26/2014, 1:34 PM  LOS: 2 days

## 2014-01-26 NOTE — Progress Notes (Signed)
Patient ID: Eduardo West, male   DOB: Jun 06, 1961, 53 y.o.   MRN: [id number]    Subjective: Pt feels ok today. No pain.  No flatus or BM.  NGT with 800cc/24h  Objective: Vital signs in last 24 hours: Temp:  [97.4 F (36.3 C)-98.2 F (36.8 C)] 98.2 F (36.8 C) (02/27 0616) Pulse Rate:  [38-83] 82 (02/27 0616) Resp:  [18] 18 (02/26 2128) BP: (141-170)/(88-99) 148/93 mmHg (02/27 0616) SpO2:  [97 %-98 %] 98 % (02/27 0616) Last BM Date: 01/24/14  Intake/Output from previous day: 02/26 0701 - 02/27 0700 In: -  Out: 800 [Emesis/NG output:800] Intake/Output this shift:    PE: Abd: soft, minimal RLQ tenderness, hypoactive BS, NGT with bilious output Heart: regular Lungs: CTAB  Lab Results:   Recent Labs  01/24/14 1900 01/25/14 0520  WBC 11.8* 8.1  HGB 13.9 12.9*  HCT 43.0 40.1  PLT 272 256   BMET  Recent Labs  01/25/14 0520 01/26/14 0539  NA 139 140  K 3.3* 3.4*  CL 100 101  CO2 29 29  GLUCOSE 134* 105*  BUN 8 7  CREATININE 0.98 1.08  CALCIUM 8.5 8.9   PT/INR No results found for this basename: LABPROT, INR,  in the last 72 hours CMP     Component Value Date/Time   NA 140 01/26/2014 0539   K 3.4* 01/26/2014 0539   CL 101 01/26/2014 0539   CO2 29 01/26/2014 0539   GLUCOSE 105* 01/26/2014 0539   BUN 7 01/26/2014 0539   CREATININE 1.08 01/26/2014 0539   CALCIUM 8.9 01/26/2014 0539   PROT 6.4 01/26/2014 0539   ALBUMIN 3.6 01/26/2014 0539   AST 28 01/26/2014 0539   ALT 29 01/26/2014 0539   ALKPHOS 73 01/26/2014 0539   BILITOT 1.9* 01/26/2014 0539   GFRNONAA 78* 01/26/2014 0539   GFRAA >90 01/26/2014 0539   Lipase     Component Value Date/Time   LIPASE 31 01/24/2014 1900       Studies/Results: Ct Abdomen Pelvis W Contrast  01/24/2014   CLINICAL DATA:  53 year old male with right-sided abdominal and pelvic pain. History of diverticulitis and colonic surgery.  EXAM: CT ABDOMEN AND PELVIS WITH CONTRAST  TECHNIQUE: Multidetector CT imaging of the abdomen and pelvis was  performed using the standard protocol following bolus administration of intravenous contrast.  CONTRAST:  100 cc intravenous Omnipaque 300  COMPARISON:  07/25/2013 and prior CTs  FINDINGS: Dilated mid small bowel loops are identified to a transition point in the anterior right upper pelvis compatible with a small bowel obstruction. Small bowel wall thickening/edema in this region is noted and may be reactive. No discrete obstructing causes identified in this may be secondary to an adhesion. A small amount of ascites is present primarily in a perihepatic location. There is no evidence of pneumoperitoneum or abscess.  Evidence of right colectomy noted.  Multiple hepatic cysts are present. Mild bibasilar atelectasis/ scarring again noted.  The spleen, adrenal glands, pancreas are unremarkable.  Punctate nonobstructing bilateral renal calculi and small renal cysts are noted.  The patient is status post cholecystectomy.  There is no evidence of abdominal aortic aneurysm, biliary dilatation or enlarged lymph nodes.  The bladder is unremarkable.  No acute or suspicious bony abnormalities are identified.  IMPRESSION: Small bowel obstruction with transition point in the anterior right upper pelvis without definite obstructing cause - question adhesion. There is small bowel wall thickening/edema in this region which most likely is reactive. Small amount of free pelvic  fluid. No evidence of pneumoperitoneum.  Evidence of right colectomy. Punctate nonobstructing bilateral renal calculi.   Electronically Signed   By: Hassan Rowan M.D.   On: 01/24/2014 21:03   Dg Abd Portable 2v  01/25/2014   CLINICAL DATA:  Evaluate small bowel obstruction.  EXAM: PORTABLE ABDOMEN - 2 VIEW  COMPARISON:  CT 01/24/2014 and abdominal films 10/12/2012  FINDINGS: There are surgical clips over the right upper quadrant as well as a surgical suture line over the right mid abdomen likely from patient's previous right colectomy. Bowel gas pattern is  nonobstructive. There is a mild amount of air throughout the colon. No definite dilated small bowel loops or air-fluid levels. No free peritoneal air. Minimal contrast within the bladder. Remainder the exam is unchanged.  IMPRESSION: Nonspecific, nonobstructive bowel gas pattern.   Electronically Signed   By: Marin Olp M.D.   On: 01/25/2014 07:53    Anti-infectives: Anti-infectives   Start     Dose/Rate Route Frequency Ordered Stop   01/24/14 2359  ciprofloxacin (CIPRO) IVPB 400 mg     400 mg 200 mL/hr over 60 Minutes Intravenous Every 12 hours 01/24/14 2304     01/24/14 2330  metroNIDAZOLE (FLAGYL) IVPB 500 mg     500 mg 100 mL/hr over 60 Minutes Intravenous 3 times per day 01/24/14 2304         Assessment/Plan  1. SBO 2. Intermittent bradycardia  Plan: 1. Appreciate medicine's evaluation of patient and recommendations 2. Will repeat films today of pt's abdomen.  Yesterday's revealed a nonobstructive pattern, but he still had 800cc from his NGT overnight.  He may just have fluid filled bowels that don't show up. For now, cont NGT and NPO.   LOS: 2 days    Keyshun Elpers E 01/26/2014, 9:46 AM Pager: [phone]

## 2014-01-26 NOTE — Progress Notes (Signed)
ATTENDING ADDENDUM:  I personally reviewed patient's record, examined the patient, and formulated the following assessment and plan:  Cont NG for now, abd benign

## 2014-01-27 NOTE — Progress Notes (Signed)
Patient ID: Eduardo West, male   DOB: Jan 10, 1961, 53 y.o.   MRN: [id number]    Subjective: Patient well known to me as I did his ileocolectomy several years ago for perforated cecal diverticulitis. He has some chronic GI complaints with pain and diarrhea. Now admitted with small bowel obstruction by CT scan. He states he feels better today. Less pain but still a little pain in the right lower quadrant. He has been passing flatus but no bowel movements.  Objective: Vital signs in last 24 hours: Temp:  [97.6 F (36.4 C)-98.5 F (36.9 C)] 98.4 F (36.9 C) (02/28 0630) Pulse Rate:  [70-78] 70 (02/28 0630) Resp:  [18] 18 (02/28 0630) BP: (125-135)/(81-91) 135/91 mmHg (02/28 0630) SpO2:  [97 %-98 %] 98 % (02/28 0630) Last BM Date: 01/24/14  Intake/Output from previous day: 19-Feb-2023 0701 - 02/28 0700 In: 3176.7 [P.O.:30; I.V.:2446.7; IV Piggyback:700] Out: 1350 [Urine:650; Emesis/NG output:700] Intake/Output this shift:    General appearance: alert, cooperative and no distress GI: minimal if any distention. Bowel sounds present. There is mild right lower quadrant tenderness without guarding.  Lab Results:   Recent Labs  01/24/14 1900 01/25/14 0520  WBC 11.8* 8.1  HGB 13.9 12.9*  HCT 43.0 40.1  PLT 272 256   BMET  Recent Labs  01/25/14 0520 2014-02-19 0539  NA 139 140  K 3.3* 3.4*  CL 100 101  CO2 29 29  GLUCOSE 134* 105*  BUN 8 7  CREATININE 0.98 1.08  CALCIUM 8.5 8.9     Studies/Results: Dg Abd 2 Views  19-Feb-2014   CLINICAL DATA:  Small bowel obstruction, followup  EXAM: ABDOMEN - 2 VIEW  COMPARISON:  Abdomen films of 01/25/2014  FINDINGS: The tip of the NG tube courses into the region of the expected descending duodenum, in good position. The bowel gas pattern is nonspecific. No free air is seen on the erect view. There is mild left basilar atelectasis present with a probable small left pleural effusion.  IMPRESSION: Tip of NG tube in the descending duodenum. No bowel  obstruction. No free air.   Electronically Signed   By: Ivar Drape M.D.   On: 2014/02/19 10:36    Anti-infectives: Anti-infectives   Start     Dose/Rate Route Frequency Ordered Stop   01/24/14 2359  ciprofloxacin (CIPRO) IVPB 400 mg     400 mg 200 mL/hr over 60 Minutes Intravenous Every 12 hours 01/24/14 2304     01/24/14 2330  metroNIDAZOLE (FLAGYL) IVPB 500 mg     500 mg 100 mL/hr over 60 Minutes Intravenous 3 times per day 01/24/14 2304        Assessment/Plan: Small bowel obstruction with transition point in the right lower quadrant and some small bowel thickening in this area. He is feeling better with bowel rest and NG suction. Plain films have never indicated an obstruction, likely secondary to fluid filled loops. He still has significant bilious NG drainage, 400 cc in the past 4 hours, and I will leave the NG today and continue bowel rest.    LOS: 3 days    Guillermina Shaft T 01/27/2014

## 2014-01-28 LAB — BASIC METABOLIC PANEL
BUN: 6 mg/dL (ref 6–23)
CALCIUM: 8.9 mg/dL (ref 8.4–10.5)
CO2: 26 meq/L (ref 19–32)
CREATININE: 0.93 mg/dL (ref 0.50–1.35)
Chloride: 100 mEq/L (ref 96–112)
GFR calc non Af Amer: >90 mL/min (ref >90)
Glucose, Bld: 121 mg/dL — ABNORMAL HIGH (ref 70–99)
Potassium: 3.3 mEq/L — ABNORMAL LOW (ref 3.7–5.3)
Sodium: 139 mEq/L (ref 137–147)

## 2014-01-28 LAB — CBC
HCT: 40.3 % (ref 39.0–52.0)
Hemoglobin: 13.3 g/dL (ref 13.0–17.0)
MCH: 31.2 pg (ref 26.0–34.0)
MCHC: 33 g/dL (ref 30.0–36.0)
MCV: 94.6 fL (ref 78.0–100.0)
PLATELETS: 236 10*3/uL (ref 150–400)
RBC: 4.26 MIL/uL (ref 4.22–5.81)
RDW: 12.1 % (ref 11.5–15.5)
WBC: 7.6 10*3/uL (ref 4.0–10.5)

## 2014-01-28 NOTE — Progress Notes (Signed)
Pt ambulating in the length of the hall x3 in the morning and x4 in the afternoon, tolerated well. Also tolerating his liquid diet well. Family in visiting. Pt states having 2 BM today

## 2014-01-28 NOTE — Progress Notes (Signed)
Patient ID: Eduardo West, male   DOB: [birth year], 53 y.o.   MRN: [id number]    Subjective: Patient denies any complaints this morning. Feels "good". States he has had flatus and a bowel movement. No nominal pain.  Objective: Vital signs in last 24 hours: Temp:  [97.3 F (36.3 C)-98.3 F (36.8 C)] 98.3 F (36.8 C) (03/01 0627) Pulse Rate:  [39-71] 39 (03/01 0627) Resp:  [16-20] 20 (03/01 0627) BP: (135-143)/(74-92) 135/85 mmHg (03/01 0627) SpO2:  [97 %-99 %] 98 % (03/01 0627) Last BM Date: 01/24/14  Intake/Output from previous day: 02/28 0701 - 03/01 0700 In: 1296.7 [I.V.:1296.7] Out: 500 [Emesis/NG output:500] Intake/Output this shift: Total I/O In: 0  Out: 550 [Urine:550]  General appearance: alert, cooperative and no distress GI: normal findings: soft, non-tender and nondistended  Lab Results:   Recent Labs  01/28/14 0611  WBC 7.6  HGB 13.3  HCT 40.3  PLT 236   BMET  Recent Labs  01/26/14 0539 01/28/14 0611  NA 140 139  K 3.4* 3.3*  CL 101 100  CO2 29 26  GLUCOSE 105* 121*  BUN 7 6  CREATININE 1.08 0.93  CALCIUM 8.9 8.9     Studies/Results: No results found.  Anti-infectives: Anti-infectives   Start     Dose/Rate Route Frequency Ordered Stop   01/24/14 2359  ciprofloxacin (CIPRO) IVPB 400 mg     400 mg 200 mL/hr over 60 Minutes Intravenous Every 12 hours 01/24/14 2304     01/24/14 2330  metroNIDAZOLE (FLAGYL) IVPB 500 mg     500 mg 100 mL/hr over 60 Minutes Intravenous 3 times per day 01/24/14 2304        Assessment/Plan: SBO appears to be resolving clinically. Plain films not helpful as they have been normal throughout. He has less NG drainage. Will DC NG tube and start a clear liquid diet.    LOS: 4 days    Kalley Nicholl T 01/28/2014

## 2014-01-29 NOTE — Progress Notes (Signed)
General Surgery [facility]  Patient seen and examined.  Having BM's.  Denies pain.  NG out.  Will try clear liquid diet today.  Earnstine Regal, MD, [facility] Office: [phone]

## 2014-01-29 NOTE — Progress Notes (Signed)
Patient ID: Eduardo West, male   DOB: 02-24-1961, 53 y.o.   MRN: [id number]    Subjective: Pt feels great.  Tolerating clears well.  Had a BM this morning and 3 yesterday.  Objective: Vital signs in last 24 hours: Temp:  [97.7 F (36.5 C)-98.1 F (36.7 C)] 97.7 F (36.5 C) (03/01 2144) Pulse Rate:  [74-77] 74 (03/01 2144) Resp:  [18] 18 (03/01 2144) BP: (138-150)/(76-95) 138/95 mmHg (03/01 2144) SpO2:  [96 %-100 %] 96 % (03/01 2144) Last BM Date: 01/28/14  Intake/Output from previous day: 03/01 0701 - 03/02 0700 In: 4206.3 [P.O.:838; I.V.:2368.3; IV Piggyback:1000] Out: 601 [Urine:550; Emesis/NG output:51] Intake/Output this shift: Total I/O In: 960 [P.O.:960] Out: -   PE: Abd: soft, NT, ND, +BS  Lab Results:   Recent Labs  01/28/14 0611  WBC 7.6  HGB 13.3  HCT 40.3  PLT 236   BMET  Recent Labs  01/28/14 0611  NA 139  K 3.3*  CL 100  CO2 26  GLUCOSE 121*  BUN 6  CREATININE 0.93  CALCIUM 8.9   PT/INR No results found for this basename: LABPROT, INR,  in the last 72 hours CMP     Component Value Date/Time   NA 139 01/28/2014 0611   K 3.3* 01/28/2014 0611   CL 100 01/28/2014 0611   CO2 26 01/28/2014 0611   GLUCOSE 121* 01/28/2014 0611   BUN 6 01/28/2014 0611   CREATININE 0.93 01/28/2014 0611   CALCIUM 8.9 01/28/2014 0611   PROT 6.4 01/26/2014 0539   ALBUMIN 3.6 01/26/2014 0539   AST 28 01/26/2014 0539   ALT 29 01/26/2014 0539   ALKPHOS 73 01/26/2014 0539   BILITOT 1.9* 01/26/2014 0539   GFRNONAA >90 01/28/2014 0611   GFRAA >90 01/28/2014 0611   Lipase     Component Value Date/Time   LIPASE 31 01/24/2014 1900       Studies/Results: No results found.  Anti-infectives: Anti-infectives   Start     Dose/Rate Route Frequency Ordered Stop   01/24/14 2359  ciprofloxacin (CIPRO) IVPB 400 mg     400 mg 200 mL/hr over 60 Minutes Intravenous Every 12 hours 01/24/14 2304     01/24/14 2330  metroNIDAZOLE (FLAGYL) IVPB 500 mg     500 mg 100 mL/hr over 60 Minutes  Intravenous 3 times per day 01/24/14 2304         Assessment/Plan  1. SBO, improving  Plan: 1. Advance to full liquids today.  If tolerates, then will give regular diet tomorrow and plan for dc home then.   LOS: 5 days    Eduardo West 01/29/2014, 9:23 AM Pager: [phone]

## 2014-01-29 NOTE — Progress Notes (Signed)
Pt tolerating full liquid diet. Ambulating frequently in the halls, 3-4 times the length of the hall each time. Denies any pain.

## 2014-01-30 NOTE — Discharge Summary (Signed)
General Surgery [facility]  Clinically small bowel obstruction has resolved.  Discharge planned for today.  Follow up at [place] office as scheduled.  Earnstine Regal, MD, [facility] Office: [phone]

## 2014-01-30 NOTE — Discharge Summary (Signed)
Patient ID: Eduardo West MRN: [id number] DOB/AGE: Nov 23, 1961 53 y.o.  Admit date: 01/24/2014 Discharge date: 01/30/2014  Procedures: none  Consults: internal medicine  Reason for Admission: The pt is a 53 yo [place] male who has history of abdominal surgery. Over the last couple days he has had right sided abdominal pain with nausea and vomiting. He says he had some diarrhea earlier today. No fever. CT shows sbo  Admission Diagnoses:  1. SBO 2. HTN  Hospital Course: The patient was admitted and an NGT was placed for conservative decompression.  He was also started on Cipro and Flagyl due to some concern for bowel thickening with mild infection.  The patient initially had some bradycardia.  Medicine was consulted, but this resolved and was felt likely to be secondary to medications.  Over the next several days, the patient began passing some flatus.  His NGT was able to be removed.  He was given clear liquids.  He then began having BMs as well.  His diet was able to be advanced as tolerated.  On HD 6, the patient was stable for dc home.  PE: Abd: soft, NT, ND, +BS Heart: regular  Discharge Diagnoses:  Principal Problem:   SBO (small bowel obstruction), resolved Active Problems:   Chronic abdominal pain   Chronic diarrhea   Hypertension   Aspiration pneumonia   GERD (gastroesophageal reflux disease)   Diverticulitis of cecum   Discharge Medications:   Medication List         cholestyramine 4 GM/DOSE powder  Commonly known as:  QUESTRAN  Take 1 g by mouth daily.     HYDROcodone-acetaminophen 5-500 MG per tablet  Commonly known as:  VICODIN  Take 1 tablet by mouth daily as needed for pain.     lisinopril 10 MG tablet  Commonly known as:  PRINIVIL,ZESTRIL  Take 10 mg by mouth daily.     pantoprazole 40 MG tablet  Commonly known as:  PROTONIX  Take 1 tablet (40 mg total) by mouth daily.        Discharge Instructions:     Follow-up Information   Follow up with  Harvie Junior, MD. (As needed)    Specialty:  Specialist   Contact information:   [address]. [place] [address] [phone]       Signed: Henreitta Cea 01/30/2014, 9:15 AM

## 2014-01-30 NOTE — Discharge Instructions (Signed)
Small Bowel Obstruction A small bowel obstruction is a blockage (obstruction) of the small intestine (small bowel). The small bowel is a long, slender tube that connects the stomach to the colon. Its job is to absorb nutrients from the fluids and foods you consume into the bloodstream.  CAUSES  There are many causes of intestinal blockage. The most common ones include:  Hernias. This is a more common cause in children than adults.  Inflammatory bowel disease (enteritis and colitis).  Twisting of the bowel (volvulus).  Tumors.  Scar tissue (adhesions) from previous surgery or radiation treatment.  Recent surgery. This may cause an acute small bowel obstruction called an ileus. SYMPTOMS   Abdominal pain. This may be dull cramps or sharp pain. It may occur in one area or may be present in the entire abdomen. Pain can range from mild to severe, depending on the degree of obstruction.  Nausea and vomiting. Vomit may be greenish or yellow bile color.  Distended or swollen stomach. Abdominal bloating is a common symptom.  Constipation.  Lack of passing gas.  Frequent belching.  Diarrhea. This may occur if runny stool is able to leak around the obstruction. DIAGNOSIS  Your caregiver can usually diagnose small bowel obstruction by taking a history, doing a physical exam, and taking X-rays. If the cause is unclear, a CT scan (computerized tomography) of your abdomen and pelvis may be needed. TREATMENT  Treatment of the blockage depends on the cause and how bad the problem is.   Sometimes, the obstruction improves with bed rest and intravenous (IV) fluids.  Resting the bowel is very important. This means following a simple diet. Sometimes, a clear liquid diet may be required for several days.  Sometimes, a small tube (nasogastric tube) is placed into the stomach to decompress the bowel. When the bowel is blocked, it usually swells up like a balloon filled with air and fluids.  Decompression means that the air and fluids are removed by suction through that tube. This can help with pain, discomfort, and nausea. It can also help the obstruction resolve faster.  Surgery may be required if other treatments do not work. Bowel obstruction from a hernia may require early surgery and can be an emergency procedure. Adhesions that cause frequent or severe obstructions may also require surgery. HOME CARE INSTRUCTIONS If your bowel obstruction is only partial or incomplete, you may be allowed to go home.  Get plenty of rest.  Follow your diet as directed by your caregiver.  Only consume clear liquids until your condition improves.  Avoid solid foods as instructed. SEEK IMMEDIATE MEDICAL CARE IF:  You have increased pain or cramping.  You vomit blood.  You have uncontrolled vomiting or nausea.  You cannot drink fluids due to vomiting or pain.  You develop confusion.  You begin feeling very dry or thirsty (dehydrated).  You have severe bloating.  You have chills.  You have a fever.  You feel extremely weak or you faint. MAKE SURE YOU:  Understand these instructions.  Will watch your condition.  Will get help right away if you are not doing well or get worse. Document Released: 02/02/2006 Document Revised: 02/08/2012 Document Reviewed: 01/30/2011 Exit[employer] Patient Information 2014 [employer].  

## 2014-01-30 NOTE — Progress Notes (Signed)
Patient discharge home with family wife and daughters, alert and oriented, discharge instructions given, patient and daughter verified understanding of discharge instructions given, declined My Chart access at this time, patient in stable condition for discharge home

## 2014-04-27 ENCOUNTER — Ambulatory Visit (INDEPENDENT_AMBULATORY_CARE_PROVIDER_SITE_OTHER): Payer: Medicaid Other | Admitting: Internal Medicine

## 2014-04-27 ENCOUNTER — Encounter: Payer: Self-pay | Admitting: Internal Medicine

## 2014-04-27 VITALS — BP 110/68 | HR 69 | Ht 63.0 in | Wt 145.0 lb

## 2014-04-27 DIAGNOSIS — R0602 Shortness of breath: Secondary | ICD-10-CM

## 2014-04-27 DIAGNOSIS — I499 Cardiac arrhythmia, unspecified: Secondary | ICD-10-CM

## 2014-04-27 DIAGNOSIS — I498 Other specified cardiac arrhythmias: Secondary | ICD-10-CM

## 2014-04-27 DIAGNOSIS — I10 Essential (primary) hypertension: Secondary | ICD-10-CM

## 2014-04-27 DIAGNOSIS — R0989 Other specified symptoms and signs involving the circulatory and respiratory systems: Secondary | ICD-10-CM

## 2014-04-27 DIAGNOSIS — I493 Ventricular premature depolarization: Secondary | ICD-10-CM

## 2014-04-27 DIAGNOSIS — R079 Chest pain, unspecified: Secondary | ICD-10-CM

## 2014-04-27 DIAGNOSIS — R0609 Other forms of dyspnea: Secondary | ICD-10-CM | POA: Insufficient documentation

## 2014-04-27 DIAGNOSIS — I4949 Other premature depolarization: Secondary | ICD-10-CM

## 2014-04-27 MED ORDER — METOPROLOL SUCCINATE ER 25 MG PO TB24: 25.0000 mg | ORAL_TABLET | Freq: Every day | ORAL | Status: DC

## 2014-04-27 NOTE — Progress Notes (Signed)
OFFICE NOTE  Chief Complaint:  Chest pain, dyspnea, irregular heartbeat  Primary Care Physician: Harvie Junior, MD  HPI:  Eduardo West is a pleasant 53 year old [name] he is male who is seen today with a Optometrist. He is referred by Dr. Jimmye Norman for evaluation of an abnormal heart rhythm. EKG today shows ventricular bigeminy with ST and T wave abnormalities laterally concerning for ischemia. Mr. Easterly reports some increasing chest discomfort with exertion as well as shortness of breath. He says that he typically walks 30-45 minutes almost every day but recently has had worsening shortness of breath and his fatigue after only 5-10 minutes. He says that his chest discomfort is worse when exerting himself and released somewhat by rest. There is no significant family history of heart disease per his report although hypertension is his family. He was recently hospitalized for small bowel obstruction and has improved. He has had partial colon resection as well as appendectomy in the past. In January 2015 while hospitalized he was seen for chest pain by Dr. Pernell Dupre. Echocardiogram was recommended which showed an EF of 60-65% with mild diastolic dysfunction and moderate concentric LVH. There were no segmental wall motion abnormalities. He reports his symptoms have progressively gotten worse since his recent hospitalization.  PMHx:  Past Medical History  Diagnosis Date  . Diverticulitis of cecum 12/2009    Resected 2011:  COLON, SEGMENTAL RESECTION, RIGHT AND TERMINAL ILEUM : - SEGMENT OF COLON  . Pleural effusion, left   . Kidney stones   . 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 Hypertension   . Diarrhea   . Abdominal pain   . Buzzing in ear   . Right leg numbness   . Chronic diarrhea 03/04/2012  . Colon polyp     Past Surgical History  Procedure Laterality Date  . Cholecystectomy  12/2009    Dr. Excell Seltzer  . Appendectomy  2003    Dr. Marlou Starks:  Attempted laparoscopic and subsequent open appendectomy    . Right colectomy  12/2009    Dr Excell Seltzer: open resection for cecal diverticulitis  . Colon surgery    . Flexible sigmoidoscopy N/A 09/13/2013    Procedure: FLEXIBLE SIGMOIDOSCOPY;  Surgeon: Leighton Ruff, MD;  Location: [facility];  Service: Endoscopy;  Laterality: N/A;    FAMHx:  Family History  Problem Relation Age of Onset  . Cancer Mother     SOCHx:   reports that he quit smoking about 6 years ago. He has never used smokeless tobacco. He reports that he does not drink alcohol or use illicit drugs.  ALLERGIES:  No Known Allergies  ROS: A comprehensive review of systems was negative except for: Respiratory: positive for dyspnea on exertion Cardiovascular: positive for chest pain and irregular heart beat  HOME MEDS: Current Outpatient Prescriptions  Medication Sig Dispense Refill  . HYDROcodone-acetaminophen (NORCO/VICODIN) 5-325 MG per tablet Take 1 tablet by mouth daily as needed for moderate pain.      [name] Kitchen loratadine (CLARITIN) 10 MG tablet Take 10 mg by mouth daily.      . pantoprazole (PROTONIX) 40 MG tablet Take 1 tablet (40 mg total) by mouth daily.  30 tablet  2  . metoprolol succinate (TOPROL-XL) 25 MG 24 hr tablet Take 1 tablet (25 mg total) by mouth daily.  30 tablet  6   No current facility-administered medications for this visit.    LABS/IMAGING: No results found for this or any previous visit (from the past 48 hour(s)). No results found.  VITALS:  BP 110/68  Pulse 69  Ht 5\' 3"  (1.6 m)  Wt 145 lb (65.772 kg)  BMI 25.69 kg/m2  EXAM: General appearance: alert and no distress Neck: no carotid bruit and no JVD Lungs: clear to auscultation bilaterally Heart: regularly irregular rhythm Abdomen: soft, non-tender; bowel sounds normal; no masses,  no organomegaly and large midline scar and transverse scar at McBurney's point Extremities: extremities normal, atraumatic, no cyanosis or edema Pulses: 2+ and symmetric Skin: Skin color, texture, turgor normal. No  rashes or lesions Neurologic: Grossly normal Psych: Mood, affect normal  EKG: Sinus rhythm with PVCs in a bigeminal pattern, ST and T wave abnormalities laterally concerning for ischemia, HR 69 (PVC's are non-conducted)  ASSESSMENT: 1. Ventricular bigeminy with nonconducted PVCs 2. Chest pain with exertion 3. Dyspnea on exertion 4. Hypertension  PLAN: 1.   Mr. Markuson has been EKG changes with bigeminal PVCs. He had an echocardiogram in January 2015 which showed normal systolic function, mild diastolic dysfunction and moderate LVH. No segmental wall motion abnormalities were appreciated. It seems that he has few cardiac risk factors however his EKG is markedly abnormal and an ischemia evaluation is warranted. He also reports progressive chest pain and shortness of breath with exertion. I would recommend an exercise nuclear stress test. I will discontinue his diuretic and place him on Toprol-XL 25 mg daily to see if we can suppress his PVCs. Currently his pulse rate is in the mid 30s as his PVCs are nonconducted, which is probably contributing to his exercise intolerance.  Plan to see him back in one month.  Pixie Casino, MD, [facility] Attending Cardiologist [place] 04/27/2014, 9:07 AM

## 2014-04-27 NOTE — Patient Instructions (Addendum)
Your physician has requested that you have an exercise stress myoview. For further information please visit [email or web address]. Please follow instruction sheet, as given.  Your physician has requested that you have an echocardiogram. Echocardiography is a painless test that uses sound waves to create images of your heart. It provides your doctor with information about the size and shape of your heart and how well your heart's chambers and valves are working. This procedure takes approximately one hour. There are no restrictions for this procedure.  Your physician has recommended you make the following change in your medication - STOP triameterene-hydrochlorothiazide.  - START metoprolol succinate (toprol) 25mg  once daily  Your physician recommends that you schedule a follow-up appointment in: 1 month (with a translator)

## 2014-05-02 ENCOUNTER — Ambulatory Visit (HOSPITAL_COMMUNITY): Payer: Medicaid Other

## 2014-05-03 ENCOUNTER — Telehealth (HOSPITAL_COMMUNITY): Payer: Self-pay

## 2014-05-10 ENCOUNTER — Ambulatory Visit (HOSPITAL_COMMUNITY)
Admission: RE | Admit: 2014-05-10 | Discharge: 2014-05-10 | Disposition: A | Discharge status: Home / Self Care | Payer: Medicaid Other | Source: Ambulatory Visit | Attending: Cardiology | Admitting: Cardiology

## 2014-05-10 DIAGNOSIS — Z87891 Personal history of nicotine dependence: Secondary | ICD-10-CM | POA: Insufficient documentation

## 2014-05-10 DIAGNOSIS — R079 Chest pain, unspecified: Secondary | ICD-10-CM | POA: Insufficient documentation

## 2014-05-10 DIAGNOSIS — R002 Palpitations: Secondary | ICD-10-CM | POA: Insufficient documentation

## 2014-05-10 DIAGNOSIS — R0989 Other specified symptoms and signs involving the circulatory and respiratory systems: Secondary | ICD-10-CM | POA: Insufficient documentation

## 2014-05-10 DIAGNOSIS — I493 Ventricular premature depolarization: Secondary | ICD-10-CM

## 2014-05-10 DIAGNOSIS — R0602 Shortness of breath: Secondary | ICD-10-CM | POA: Insufficient documentation

## 2014-05-10 DIAGNOSIS — R42 Dizziness and giddiness: Secondary | ICD-10-CM | POA: Insufficient documentation

## 2014-05-10 DIAGNOSIS — J45909 Unspecified asthma, uncomplicated: Secondary | ICD-10-CM | POA: Insufficient documentation

## 2014-05-10 DIAGNOSIS — R0609 Other forms of dyspnea: Secondary | ICD-10-CM | POA: Insufficient documentation

## 2014-05-10 DIAGNOSIS — I10 Essential (primary) hypertension: Secondary | ICD-10-CM | POA: Insufficient documentation

## 2014-05-10 DIAGNOSIS — I4949 Other premature depolarization: Secondary | ICD-10-CM

## 2014-05-10 MED ORDER — REGADENOSON 0.4 MG/5ML IV SOLN
0.4000 mg | Freq: Once | INTRAVENOUS | Status: AC
  Administered 2014-05-10: 0.4 mg via INTRAVENOUS

## 2014-05-10 MED ORDER — AMINOPHYLLINE 25 MG/ML IV SOLN
75.0000 mg | Freq: Once | INTRAVENOUS | Status: AC
  Administered 2014-05-10: 75 mg via INTRAVENOUS

## 2014-05-10 MED ORDER — TECHNETIUM TC 99M SESTAMIBI GENERIC - CARDIOLITE
10.6000 | Freq: Once | INTRAVENOUS | Status: AC | PRN
  Administered 2014-05-10: 11 via INTRAVENOUS

## 2014-05-10 MED ORDER — TECHNETIUM TC 99M SESTAMIBI GENERIC - CARDIOLITE
29.6000 | Freq: Once | INTRAVENOUS | Status: AC | PRN
  Administered 2014-05-10: 30 via INTRAVENOUS

## 2014-05-10 NOTE — Procedures (Addendum)
[name] [address] [place] [address] [phone]  Cardiology Nuclear Med Study  Eduardo West is a 53 y.o. male     MRN : [id number]     DOB: 1961-11-07  Procedure Date: 05/10/2014  Nuclear Med Background Indication for Stress Test:  Evaluation for Ischemia and Abnormal EKG History:  Asthma and No prior cariac or respiratory history reported. No prior NUC MPI fo rcomparison;ECHO in 11/2013-EF=60-65% Cardiac Risk Factors: History of Smoking, Hypertension and PE  Symptoms:  Chest Pain, Dizziness, DOE, Fatigue, Light-Headedness, Palpitations and SOB   Nuclear Pre-Procedure Caffeine/Decaff Intake:  9:00pm NPO After: 7:00am   IV Site: R Hand  IV 0.9% NS with Angio Cath:  22g  Chest Size (in):  38"  IV Started by: Rolene Course, RN  Height: 5\' 3"  (1.6 m)  Cup Size: n/a  BMI:  Body mass index is 25.69 kg/(m^2). Weight:  145 lb (65.772 kg)   Tech Comments:  Patient was unable to walk treadmill so was changed to a Technical brewer Med Study 1 or 2 day study: 1 day  Stress Test Type:  Stress  Order Authorizing Provider:  Lyman Bishop, Md   Resting Radionuclide: Technetium 41m Sestamibi  Resting Radionuclide Dose: 10.6 mCi   Stress Radionuclide:  Technetium 34m Sestamibi  Stress Radionuclide Dose: 29.6 mCi           Stress Protocol Rest HR: 71 Stress HR: 91  Rest BP: 115/78 Stress BP: 130/73  Exercise Time (min): n/a METS: n/a   Predicted Max HR: 169 bpm % Max HR: 56.8 bpm Rate Pressure Product: 13152  Dose of Adenosine (mg):  n/a Dose of Lexiscan: 0.4 mg  Dose of Atropine (mg): n/a Dose of Dobutamine: n/a mcg/kg/min (at max HR)  Stress Test Technologist: Leane Para, CCT Nuclear Technologist: Imagene Riches, CNMT   Rest Procedure:  Myocardial perfusion imaging was performed at rest 45 minutes following the intravenous administration of Technetium 30m Sestamibi. Stress Procedure:  The patient received IV  Lexiscan 0.4 mg over 15-seconds.  Technetium 59m Sestamibi injected at 30-seconds.  Patient experienced stomach pain and 75 mg of Aminophylline IV was administered.  There were no significant changes with Lexiscan.  Quantitative spect images were obtained after a 45 minute delay.  Transient Ischemic Dilatation (Normal <1.22):  1.16  QGS EDV:  134 ml QGS ESV:  63 ml LV Ejection Fraction: 53%  Rest ECG: NSR - Normal EKG  Stress ECG: No significant change from baseline ECG and There are scattered PVCs.  QPS Raw Data Images:  Normal; no motion artifact; normal heart/lung ratio. Stress Images:  Normal homogeneous uptake in all areas of the myocardium. Rest Images:  Normal homogeneous uptake in all areas of the myocardium. Subtraction (SDS):  Normal  Impression Exercise Capacity:  Lexiscan with no exercise. BP Response:  Normal blood pressure response. Clinical Symptoms:  No significant symptoms noted. ECG Impression:  No significant ST segment change suggestive of ischemia. Comparison with Prior Nuclear Study: No previous nuclear study performed  Overall Impression:  Normal stress nuclear study.  LV Wall Motion:  NL LV Function; NL Wall Motion; EF 53%  Pixie Casino, MD, [facility] Board Certified in Nuclear Cardiology Attending Cardiologist [place], MD  05/10/2014 6:11 PM

## 2014-05-29 ENCOUNTER — Encounter: Payer: Self-pay | Admitting: Internal Medicine

## 2014-05-29 ENCOUNTER — Ambulatory Visit (INDEPENDENT_AMBULATORY_CARE_PROVIDER_SITE_OTHER): Payer: Medicaid Other | Admitting: Internal Medicine

## 2014-05-29 VITALS — BP 137/88 | HR 73 | Ht 63.0 in | Wt 145.9 lb

## 2014-05-29 DIAGNOSIS — I10 Essential (primary) hypertension: Secondary | ICD-10-CM

## 2014-05-29 DIAGNOSIS — I499 Cardiac arrhythmia, unspecified: Principal | ICD-10-CM

## 2014-05-29 DIAGNOSIS — I498 Other specified cardiac arrhythmias: Secondary | ICD-10-CM

## 2014-05-29 NOTE — Progress Notes (Signed)
OFFICE NOTE  Chief Complaint:  Chest pain, dyspnea, irregular heartbeat  Primary Care Physician: Harvie Junior, MD  HPI:  Eduardo West is a pleasant 53 year old [name] he is male who is seen today with a Optometrist. He is referred by Dr. Jimmye Norman for evaluation of an abnormal heart rhythm. EKG today shows ventricular bigeminy with ST and T wave abnormalities laterally concerning for ischemia. [name] reports some increasing chest discomfort with exertion as well as shortness of breath. He says that he typically walks 30-45 minutes almost every day but recently has had worsening shortness of breath and his fatigue after only 5-10 minutes. He says that his chest discomfort is worse when exerting himself and released somewhat by rest. There is no significant family history of heart disease per his report although hypertension is his family. He was recently hospitalized for small bowel obstruction and has improved. He has had partial colon resection as well as appendectomy in the past. In January 2015 while hospitalized he was seen for chest pain by Dr. Pernell Dupre. Echocardiogram was recommended which showed an EF of 60-65% with mild diastolic dysfunction and moderate concentric LVH. There were no segmental wall motion abnormalities. He reports his symptoms have progressively gotten worse since his recent hospitalization.  [name] follows up today and reports that he is feeling better. His chest discomfort or shortness of breath have resolved. He did have a stress test which was negative for ischemia. He has been taking Toprol which has improved his symptoms, however he remains in a ventricular bigeminy. The etiology of this is not yet certain.  PMHx:  Past Medical History  Diagnosis Date  . Diverticulitis of cecum 12/2009    Resected 2011:  COLON, SEGMENTAL RESECTION, RIGHT AND TERMINAL ILEUM : - SEGMENT OF COLON  . Pleural effusion, left   . Kidney stones   . 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  Hypertension   . Diarrhea   . Abdominal pain   . Buzzing in ear   . Right leg numbness   . Chronic diarrhea 03/04/2012  . Colon polyp     Past Surgical History  Procedure Laterality Date  . Cholecystectomy  12/2009    Dr. Excell Seltzer  . Appendectomy  2003    Dr. Marlou Starks:  Attempted laparoscopic and subsequent open appendectomy  . Right colectomy  12/2009    Dr Excell Seltzer: open resection for cecal diverticulitis  . Colon surgery    . Flexible sigmoidoscopy N/A 09/13/2013    Procedure: FLEXIBLE SIGMOIDOSCOPY;  Surgeon: Leighton Ruff, MD;  Location: [facility];  Service: Endoscopy;  Laterality: N/A;    FAMHx:  Family History  Problem Relation Age of Onset  . Cancer Mother     SOCHx:   reports that he quit smoking about 6 years ago. He has never used smokeless tobacco. He reports that he does not drink alcohol or use illicit drugs.  ALLERGIES:  No Known Allergies  ROS: A comprehensive review of systems was negative.  HOME MEDS: Current Outpatient Prescriptions  Medication Sig Dispense Refill  . HYDROcodone-acetaminophen (NORCO/VICODIN) 5-325 MG per tablet Take 1 tablet by mouth daily as needed for moderate pain.      [name] Kitchen loratadine (CLARITIN) 10 MG tablet Take 10 mg by mouth daily.      . metoprolol succinate (TOPROL-XL) 25 MG 24 hr tablet Take 1 tablet (25 mg total) by mouth daily.  30 tablet  6  . pantoprazole (PROTONIX) 40 MG tablet Take 1 tablet (40 mg total)  by mouth daily.  30 tablet  2   No current facility-administered medications for this visit.    LABS/IMAGING: No results found for this or any previous visit (from the past 48 hour(s)). No results found.  VITALS: BP 137/88  Pulse 73  Ht 5\' 3"  (1.6 m)  Wt 145 lb 14.4 oz (66.18 kg)  BMI 25.85 kg/m2  EXAM: General appearance: alert and no distress Neck: no carotid bruit and no JVD Lungs: clear to auscultation bilaterally Heart: regularly irregular rhythm Abdomen: soft, non-tender; bowel sounds normal; no masses,   no organomegaly and large midline scar and transverse scar at McBurney's point Extremities: extremities normal, atraumatic, no cyanosis or edema Pulses: 2+ and symmetric Skin: Skin color, texture, turgor normal. No rashes or lesions Neurologic: Grossly normal Psych: Mood, affect normal  EKG: Sinus rhythm with PVCs in a bigeminal pattern, ST and T wave abnormalities laterally concerning for ischemia, HR 70 (PVC's are non-conducted)  ASSESSMENT: 1. Ventricular bigeminy with nonconducted PVCs 2. Chest pain with exertion - low risk nuclear stress test, symptoms resolved on b-blocker 3. Dyspnea on exertion - improved 4. Hypertension  PLAN: 1.   [name] had a low-risk nuclear stress test. He symptoms have resolved on a beta blocker although he remains in ventricular bigeminy. His blood pressure is improved and I recommend him staying off of his diuretic. I will plan to see him back in 6 months. He remains in ventricular bigeminy he may need a referral to cardiac electrophysiology.  Pixie Casino, MD, [facility] Attending Cardiologist [facility]  [name],[name] 05/29/2014, 11:02 AM

## 2014-05-29 NOTE — Patient Instructions (Signed)
Your physician wants you to follow-up in: 6 months with Dr. Debara Pickett. You will receive a reminder letter in the mail two months in advance. If you don't receive a letter, please call our office to schedule the follow-up appointment. ** appointment with translator.

## 2014-05-31 NOTE — Telephone Encounter (Signed)
Encounter complete. 

## 2014-07-16 ENCOUNTER — Other Ambulatory Visit: Payer: Self-pay | Admitting: *Deleted

## 2014-07-20 ENCOUNTER — Other Ambulatory Visit: Payer: Self-pay | Admitting: *Deleted

## 2014-07-20 MED ORDER — METOPROLOL TARTRATE 25 MG PO TABS: 12.5000 mg | ORAL_TABLET | Freq: Two times a day (BID) | ORAL | Status: DC

## 2014-07-20 NOTE — Telephone Encounter (Signed)
Rx was sent to pharmacy electronically.  Changed from succinate to tartrate r/t insurance coverage

## 2014-11-14 ENCOUNTER — Ambulatory Visit (INDEPENDENT_AMBULATORY_CARE_PROVIDER_SITE_OTHER): Payer: Medicaid Other | Admitting: Internal Medicine

## 2014-11-14 ENCOUNTER — Encounter: Payer: Self-pay | Admitting: Internal Medicine

## 2014-11-14 ENCOUNTER — Ambulatory Visit: Payer: Medicaid Other | Admitting: Internal Medicine

## 2014-11-14 VITALS — BP 110/70 | HR 68 | Ht 63.0 in | Wt 147.6 lb

## 2014-11-14 DIAGNOSIS — I10 Essential (primary) hypertension: Secondary | ICD-10-CM

## 2014-11-14 DIAGNOSIS — R5383 Other fatigue: Secondary | ICD-10-CM | POA: Insufficient documentation

## 2014-11-14 DIAGNOSIS — R0789 Other chest pain: Secondary | ICD-10-CM

## 2014-11-14 DIAGNOSIS — I498 Other specified cardiac arrhythmias: Secondary | ICD-10-CM

## 2014-11-14 DIAGNOSIS — I499 Cardiac arrhythmia, unspecified: Secondary | ICD-10-CM

## 2014-11-14 NOTE — Progress Notes (Signed)
OFFICE NOTE  Chief Complaint:  Chest pain, dyspnea, irregular heartbeat  Primary Care Physician: Harvie Junior, MD  HPI:  Eduardo West is a pleasant 53 year old [name] he is male who is seen today with a Optometrist. He is referred by Dr. Jimmye Norman for evaluation of an abnormal heart rhythm. EKG today shows ventricular bigeminy with ST and T wave abnormalities laterally concerning for ischemia. Eduardo West reports some increasing chest discomfort with exertion as well as shortness of breath. He says that he typically walks 30-45 minutes almost every day but recently has had worsening shortness of breath and his fatigue after only 5-10 minutes. He says that his chest discomfort is worse when exerting himself and released somewhat by rest. There is no significant family history of heart disease per his report although hypertension is his family. He was recently hospitalized for small bowel obstruction and has improved. He has had partial colon resection as well as appendectomy in the past. In January 2015 while hospitalized he was seen for chest pain by Dr. Pernell Dupre. Echocardiogram was recommended which showed an EF of 60-65% with mild diastolic dysfunction and moderate concentric LVH. There were no segmental wall motion abnormalities. He reports his symptoms have progressively gotten worse since his recent hospitalization.  [name] returns for follow-up. At his last office visit I started him on low-dose beta blocker and he remains in ventricular bigeminy today. The electrical rate is 68 however his pulse rate is half of that at 34. He cannot express whether he has more fatigue or chest discomfort on the medicine than he had before. He certainly does not feel in any improvement on the beta blocker. Pulse rate may be slower.  PMHx:  Past Medical History  Diagnosis Date  . Diverticulitis of cecum 12/2009    Resected 2011:  COLON, SEGMENTAL RESECTION, RIGHT AND TERMINAL ILEUM : - SEGMENT OF  COLON  . Pleural effusion, left   . Kidney stones   . 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 Hypertension   . Diarrhea   . Abdominal pain   . Buzzing in ear   . Right leg numbness   . Chronic diarrhea 03/04/2012  . Colon polyp     Past Surgical History  Procedure Laterality Date  . Cholecystectomy  12/2009    Dr. Excell Seltzer  . Appendectomy  2003    Dr. Marlou Starks:  Attempted laparoscopic and subsequent open appendectomy  . Right colectomy  12/2009    Dr Excell Seltzer: open resection for cecal diverticulitis  . Colon surgery    . Flexible sigmoidoscopy N/A 09/13/2013    Procedure: FLEXIBLE SIGMOIDOSCOPY;  Surgeon: Leighton Ruff, MD;  Location: [facility];  Service: Endoscopy;  Laterality: N/A;    FAMHx:  Family History  Problem Relation Age of Onset  . Cancer Mother     SOCHx:   reports that he quit smoking about 6 years ago. He has never used smokeless tobacco. He reports that he does not drink alcohol or use illicit drugs.  ALLERGIES:  No Known Allergies  ROS: A comprehensive review of systems was negative except for: Constitutional: positive for fatigue Respiratory: positive for dyspnea on exertion Cardiovascular: positive for irregular heart beat  HOME MEDS: Current Outpatient Prescriptions  Medication Sig Dispense Refill  . HYDROcodone-acetaminophen (NORCO/VICODIN) 5-325 MG per tablet Take 1 tablet by mouth daily as needed for moderate pain.    [name] Kitchen loratadine (CLARITIN) 10 MG tablet Take 10 mg by mouth daily.    . pantoprazole (PROTONIX) 40 MG  tablet Take 1 tablet (40 mg total) by mouth daily. 30 tablet 2   No current facility-administered medications for this visit.    LABS/IMAGING: No results found for this or any previous visit (from the past 48 hour(s)). No results found.  VITALS: BP 110/70 mmHg  Pulse 68  Ht 5\' 3"  (1.6 m)  Wt 147 lb 9.6 oz (66.951 kg)  BMI 26.15 kg/m2  EXAM: General appearance: alert and no distress Neck: no carotid bruit and no JVD Lungs: clear to  auscultation bilaterally Heart: regularly irregular rhythm Abdomen: soft, non-tender; bowel sounds normal; no masses,  no organomegaly and large midline scar and transverse scar at McBurney's point Extremities: extremities normal, atraumatic, no cyanosis or edema Pulses: 2+ and symmetric Skin: Skin color, texture, turgor normal. No rashes or lesions Neurologic: Grossly normal Psych: Mood, affect normal  EKG: Sinus rhythm with PVCs in a bigeminal pattern, ST and T wave abnormalities laterally concerning for ischemia, HR 68 (PVC's are non-conducted)  ASSESSMENT: 1. Ventricular bigeminy with nonconducted PVCs 2. Chest pain with exertion - low risk nuclear stress test 3. Dyspnea on exertion - persistent 4. Hypertension  PLAN: 1.   Mr. [name] had a low-risk nuclear stress test. He is not convinced that his symptoms have entirely improved on the beta blocker. He continues to have ventricular bigeminy. He may be feeling more fatigued, according to the translator. I will go ahead and discontinue his beta blocker today. I will refer him to cardiac electrophysiology for further evaluation and suggestions about management of his ventricular bigeminy.  Pixie Casino, MD, [facility] Attending Cardiologist [facility]  [name],[name] 11/14/2014, 9:49 AM

## 2014-11-14 NOTE — Patient Instructions (Addendum)
Your physician has recommended you make the following change in your medication: STOP metoprolol.   You have been referred to a cardiac electrophysiologist.   Your physician wants you to follow-up in: 6 months with Dr. Debara Pickett. You will receive a reminder letter in the mail two months in advance. If you don't receive a letter, please call our office to schedule the follow-up appointment.

## 2014-11-20 ENCOUNTER — Ambulatory Visit: Payer: Medicaid Other | Admitting: Internal Medicine

## 2014-12-05 ENCOUNTER — Encounter: Payer: Self-pay | Admitting: Internal Medicine

## 2014-12-05 ENCOUNTER — Ambulatory Visit (INDEPENDENT_AMBULATORY_CARE_PROVIDER_SITE_OTHER)
Admission: RE | Admit: 2014-12-05 | Discharge: 2014-12-05 | Disposition: A | Discharge status: Home / Self Care | Payer: Medicaid Other | Source: Ambulatory Visit | Attending: Internal Medicine | Admitting: Internal Medicine

## 2014-12-05 ENCOUNTER — Ambulatory Visit (INDEPENDENT_AMBULATORY_CARE_PROVIDER_SITE_OTHER): Payer: Medicaid Other | Admitting: Internal Medicine

## 2014-12-05 ENCOUNTER — Other Ambulatory Visit (INDEPENDENT_AMBULATORY_CARE_PROVIDER_SITE_OTHER): Payer: Medicaid Other

## 2014-12-05 VITALS — BP 126/84 | HR 69 | Temp 97.7°F | Ht 63.0 in | Wt 146.0 lb

## 2014-12-05 DIAGNOSIS — J90 Pleural effusion, not elsewhere classified: Secondary | ICD-10-CM

## 2014-12-05 DIAGNOSIS — R0600 Dyspnea, unspecified: Secondary | ICD-10-CM

## 2014-12-05 DIAGNOSIS — J948 Other specified pleural conditions: Secondary | ICD-10-CM

## 2014-12-05 LAB — CBC WITH DIFFERENTIAL/PLATELET
BASOS ABS: 0 10*3/uL (ref 0.0–0.1)
Basophils Relative: 0.3 % (ref 0.0–3.0)
EOS PCT: 5.2 % — AB (ref 0.0–5.0)
Eosinophils Absolute: 0.3 10*3/uL (ref 0.0–0.7)
HEMATOCRIT: 39.5 % (ref 39.0–52.0)
Hemoglobin: 12.9 g/dL — ABNORMAL LOW (ref 13.0–17.0)
Lymphocytes Relative: 23 % (ref 12.0–46.0)
Lymphs Abs: 1.4 10*3/uL (ref 0.7–4.0)
MCHC: 32.7 g/dL (ref 30.0–36.0)
MCV: 94.9 fl (ref 78.0–100.0)
MONO ABS: 0.4 10*3/uL (ref 0.1–1.0)
Monocytes Relative: 5.6 % (ref 3.0–12.0)
NEUTROS PCT: 65.9 % (ref 43.0–77.0)
Neutro Abs: 4.1 10*3/uL (ref 1.4–7.7)
Platelets: 273 10*3/uL (ref 150.0–400.0)
RBC: 4.16 Mil/uL — ABNORMAL LOW (ref 4.22–5.81)
RDW: 12.9 % (ref 11.5–15.5)
WBC: 6.3 10*3/uL (ref 4.0–10.5)

## 2014-12-05 LAB — BASIC METABOLIC PANEL
BUN: 13 mg/dL (ref 6–23)
CO2: 30 mEq/L (ref 19–32)
CREATININE: 1 mg/dL (ref 0.4–1.5)
Calcium: 9.7 mg/dL (ref 8.4–10.5)
Chloride: 103 mEq/L (ref 96–112)
GFR: 79.8 mL/min (ref >60.00)
GLUCOSE: 98 mg/dL (ref 70–99)
Potassium: 3.3 mEq/L — ABNORMAL LOW (ref 3.5–5.1)
SODIUM: 139 meq/L (ref 135–145)

## 2014-12-05 LAB — TSH: TSH: 1.1 u[IU]/mL (ref 0.35–4.50)

## 2014-12-05 LAB — SEDIMENTATION RATE: Sed Rate: 12 mm/hr (ref 0–22)

## 2014-12-05 LAB — BRAIN NATRIURETIC PEPTIDE: PRO B NATRI PEPTIDE: 12 pg/mL (ref 0.0–100.0)

## 2014-12-05 NOTE — Patient Instructions (Signed)
Please remember to go to the lab and x-ray department downstairs for your tests - we will call you with the results when they are available  Always bring all active medications with you  I will write Dr Jimmye Norman with the results of the tests today and let him coordinate your care going forward but no follow up here needed

## 2014-12-05 NOTE — Progress Notes (Signed)
Quick Note:  Spoke with the pt's relative, DEP and notified of results  She verbalized understanding, repeated them back to me and will inform the pt  Nothing further needed ______

## 2014-12-05 NOTE — Progress Notes (Signed)
Subjective:     Patient ID: Eduardo West, male   DOB: [birth year] .   MRN: [id number]  HPI  [facility] [address]  54 yo [place] male quit smoking 2010 extremely difficult hx due to language barrier, even thru fm member serving as interpreter   June 06, 2010 1st pulmonary office eval for large L effusion and co fatigue and sob x months. eval by [facility] with abd ct showing large effusion. minimal L chest discomfort, wt loss ? amt. no cough or leg swelling. no fever or sweats rec tap - 600 cc serous yellow exudate 94% L> mature lymphocytes, rec flow cytometry if recurs.   July 24, 2010 Followup on thoracentesis results. Pt states still has some chest discomfort and tightness- worse with inspiration. He denies SOB or cough. Appetite is poor. cxr no real change, rec return in 3 weeks   August 22, 2010 3 wk followup with cxr. Pt c/o increased SOB- esp at in the early evening. Pt states that he is SOB without any exertion at all assoc with centralized cp with coughing only. effusion did not worsen vs the pots tap previous xrary therefore rec Omeprazole 20 mg Take one 30-60 min before first and last meals of the day  GERD diet   September 11, 2010 cc Dyspnea- the same, no better or worse. not losing any weight. no cough now. afraid to eat but not loosing any wt. rec Continue omeprazole 81m Take one 30-60 min before first and last meals of the day   October 09, 2010 ov cp all the time but worse with deep breath and sob, not eating. rec conservative f/u   November 05, 2010 ov no change doe/ cp on ppi. December 08, 2010 --Presents for an acute office visit.Complains of SOB especially at night, some heaviness in chest, occ dry cough. Comes and goes. Was seen by PCP last week and sent for xray which showed no change in left effusion. He cont to feel bad w/no energy.Cough persists w/ intermittent fevers, pain in left rib area. [place]pre[place]   December 10, 2010 seen by Dr WJoya Gaskins PPD   positive. Has chronic L effusion. No family exposure. [place]. Was pos on PPD in the past took 948month[place]H, 1992  Now with chronic effusion. Pt cont to have dry cough and weakness. Pt has intermittent fever. Pt cont to feel very bad.  2-3 months ago had fever and sweating. > health dept eval   December 22, 2010 ov no apparent change in any symptoms x tired from being on tb rx per Health dept. no convincing NS, wt loss, no change on serial cxrs since his orginal tap. rec f/u per HD and return here when released    05/06/2011 ov/[name] discharged by Health dept and  no change at all in chronic chest and abd pain and doe.  rec no change rx, f/u q 3 months until gets primary doctor to follow.        02/04/2012 f/u ov/[name] cc new onset bilateral mid back pain positional, better supine first noted over a year ago. Breathing ok and no cough. Overall pain pattern waxes and wanes but at present is no where near as severe as before. No limiting sob. Never found anything that makes it better. rec rx as IBS, no pulmonary f/u needed.    04/04/2012 f/u ov/[name] cc no better bilateral positional mid back pain, no assoc  cough or sob. Not clear he tried any of the measures rec to date nor has a primary doctor. rec Please remember to go to the x-ray > no change  Return to Dr Jimmye Norman, your primary for further evaluation of your back pain   08/23/2012 f/u ov/[name] cc "no better" cc feeling tired, doe x 50 ft  rec F/u prn    12/05/2014 f/u ov/[name] re: continues with same chronic complaints ? Worse x 2 m Chief Complaint  Patient presents with  . Acute Visit    Pt c/o fatigue and SOB for the past 2-3 months. He c/o nasal congestion at night.        No obvious day to day or daytime variabilty or assoc chronic cough or cp or chest tightness, subjective wheeze overt sinus or hb symptoms. No unusual exp hx or h/o childhood pna/ asthma or knowledge of premature  birth.  Sleeping ok without nocturnal  or early am exacerbation  of respiratory  c/o's or need for noct saba. Also denies any obvious fluctuation of symptoms with weather or environmental changes or other aggravating or alleviating factors except as outlined above   Current Medications, Allergies, Complete Past Medical History, Past Surgical History, Family History, and Social History were reviewed in [employer] record.  ROS  The following are not active complaints unless bolded sore throat, dysphagia, dental problems, itching, sneezing,  nasal congestion or excess/ purulent secretions, ear ache,   fever, chills, sweats, unintended wt loss, pleuritic or exertional cp, hemoptysis,  orthopnea pnd or leg swelling, presyncope, palpitations, heartburn, abdominal pain, anorexia, nausea, vomiting, diarrhea  or change in bowel or urinary habits, change in stools or urine, dysuria,hematuria,  rash, arthralgias, visual complaints, headache, numbness weakness or ataxia or problems with walking or coordination,  change in mood/affect or memory.           Past Medical History:  Diverticulosis  - s/p hemicolectomy and cholecystectomy for ? abscess 01/23/2010  L Pleural effusion developed post op 12/2009 p hemicolectomy - Tcentesis June 06, 2010 600 cc serous yellow exudate 94% L> mature lymphocytes  - Small/ moderated residual effusion July 24, 2010 >> lat decub no sign layering 09/05/10 > improved 05/06/2011  - ESR 55 > 40 > 29 October 09, 2010  Kidney stones  - bilateral, non obstructing by CT 06/05/10  [facility]D Dec 08 2010 [name]  - Rx started Jan 2012 > stopped by health dep 03/2011           Objective:   Physical Exam amb thin vietmamese male doesn't speak any [place], depressed with hopeless/ helpless affect / sits slouched over against wall wt 125 July 24, 2010 >  > 125 October 09, 2010 >   128 December 22, 2010 > 136 05/06/2011  > 143 08/10/2011 > 11/12/2011  144 > 147 02/04/2012 > 04/04/2012 144 > 08/23/2012 147 > 12/05/14  146 HEENT: nl dentition, turbinates, and orophanx. Nl external ear canals without cough reflex  NECK : without JVD/Nodes/TM/ nl carotid upstrokes bilaterally  LUNGS: decreased bs / dullness at L base  CV: RRR no s3 or murmur or increase in P2, no edema  ABD: soft and nontender with nl excursion in the supine position. No bruits or organomegaly, bowel sounds nl  MS: warm without deformities, calf tenderness, cyanosis or clubbing  SKIN: warm and dry without lesions       CXR:  12/05/14  I personally reviewed images and agree with radiology impression  as follows:   Stable left base pleural parenchymal scarring.       Chemistry      Component Value Date/Time   NA 139 12/05/2014 1025   K 3.3* 12/05/2014 1025   CL 103 12/05/2014 1025   CO2 30 12/05/2014 1025   BUN 13 12/05/2014 1025   CREATININE 1.0 12/05/2014 1025      Component Value Date/Time   CALCIUM 9.7 12/05/2014 1025   ALKPHOS 73 01/26/2014 0539   AST 28 01/26/2014 0539   ALT 29 01/26/2014 0539   BILITOT 1.9* 01/26/2014 0539       Lab Results  Component Value Date   WBC 6.3 12/05/2014   HGB 12.9* 12/05/2014   HCT 39.5 12/05/2014   MCV 94.9 12/05/2014   PLT 273.0 12/05/2014     Lab Results  Component Value Date   TSH 1.10 12/05/2014     Lab Results  Component Value Date   PROBNP 12.0 12/05/2014     Lab Results  Component Value Date   ESRSEDRATE 12 12/05/2014        Assessment:

## 2014-12-17 ENCOUNTER — Encounter: Payer: Self-pay | Admitting: Internal Medicine

## 2014-12-17 ENCOUNTER — Ambulatory Visit (INDEPENDENT_AMBULATORY_CARE_PROVIDER_SITE_OTHER): Payer: Medicaid Other | Admitting: Internal Medicine

## 2014-12-17 VITALS — BP 136/84 | HR 89 | Ht 63.0 in | Wt 149.0 lb

## 2014-12-17 DIAGNOSIS — I499 Cardiac arrhythmia, unspecified: Secondary | ICD-10-CM

## 2014-12-17 DIAGNOSIS — R0600 Dyspnea, unspecified: Secondary | ICD-10-CM

## 2014-12-17 DIAGNOSIS — I10 Essential (primary) hypertension: Secondary | ICD-10-CM

## 2014-12-17 DIAGNOSIS — Z5181 Encounter for therapeutic drug level monitoring: Secondary | ICD-10-CM

## 2014-12-17 DIAGNOSIS — I498 Other specified cardiac arrhythmias: Secondary | ICD-10-CM

## 2014-12-17 DIAGNOSIS — Z79899 Other long term (current) drug therapy: Secondary | ICD-10-CM

## 2014-12-17 MED ORDER — FLECAINIDE ACETATE 50 MG PO TABS: 75.0000 mg | ORAL_TABLET | Freq: Two times a day (BID) | ORAL | Status: DC

## 2014-12-17 NOTE — Assessment & Plan Note (Signed)
He has symptomatic PVC's and no syncope. I considered checking his Holter to document the total quantity of his PVC's but as he is symptomatic, I think it would be more appropriate to try and suppress them. He has had a negative stress test. I will start flecainide 75 mg twice daily. He will return in a couple of weeks for and exercise test to rule out pro-arrhythmia from flecainide. If flecainide does not worse then we would consider catheter ablation of his LV PVC's.

## 2014-12-17 NOTE — Assessment & Plan Note (Signed)
He will continue diuretic therapy. He is encouraged to maintain a low sodium diet.

## 2014-12-17 NOTE — Progress Notes (Signed)
HPI Mr.Crum is referred today by Dr. Debara Pickett for evaluation of frequent PVC's. He is a 54 yo man from [name] who has multiple medical problems including diverticulits, s/p colectomy, diarrhea and HTN. The patient has had palpitations associated chest pressure over the past several months. Workup is notable for normal LV function and normal perfusion study. He has had several ECG which demonstrate NSR with PVC's in a bigeminal fashion. He has not had a holter monitor to quantitate the number of PVC's in 24 hours. He is quite symptomatic. The history is provided by the interpreter. He denies syncope. No sustained heart racing. Review of his ECG demonstrates NSR with frequent PVC's in a RBBB morphology with axis indeterminate. He has chest pressure associated with his symptoms.  No Known Allergies   Current Outpatient Prescriptions  Medication Sig Dispense Refill  . flecainide (TAMBOCOR) 50 MG tablet Take 1.5 tablets (75 mg total) by mouth 2 (two) times daily. 180 tablet 3  . triamterene-hydrochlorothiazide (DYAZIDE) 50-25 MG per capsule Take 1 capsule by mouth daily.  5   No current facility-administered medications for this visit.     Past Medical History  Diagnosis Date  . Diverticulitis of cecum 12/2009    Resected 2011:  COLON, SEGMENTAL RESECTION, RIGHT AND TERMINAL ILEUM : - SEGMENT OF COLON  . Pleural effusion, left   . Kidney stones   . 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 Hypertension   . Diarrhea   . Abdominal pain   . Buzzing in ear   . Right leg numbness   . Chronic diarrhea 03/04/2012  . Colon polyp     ROS:   All systems reviewed and negative except as noted in the HPI.   Past Surgical History  Procedure Laterality Date  . Cholecystectomy  12/2009    Dr. Excell Seltzer  . Appendectomy  2003    Dr. Marlou Starks:  Attempted laparoscopic and subsequent open appendectomy  . Right colectomy  12/2009    Dr Excell Seltzer: open resection for cecal diverticulitis  . Colon surgery    . Flexible  sigmoidoscopy N/A 09/13/2013    Procedure: FLEXIBLE SIGMOIDOSCOPY;  Surgeon: Leighton Ruff, MD;  Location: [facility];  Service: Endoscopy;  Laterality: N/A;     Family History  Problem Relation Age of Onset  . Cancer Mother      History   Social History  . Marital Status: Single    Spouse Name: N/A    Number of Children: 2  . Years of Education: 8th   Occupational History  . unemployed    Social History Main Topics  . Smoking status: Former Smoker -- 0.50 packs/day for 20 years    Quit date: 12/01/2007  . Smokeless tobacco: Never Used  . Alcohol Use: No  . Drug Use: No  . Sexual Activity: Not on file   Other Topics Concern  . Not on file   Social History Narrative     BP 136/84 mmHg  Pulse 89  Ht 5\' 3"  (1.6 m)  Wt 149 lb (67.586 kg)  BMI 26.40 kg/m2  Physical Exam:  Well appearing NAD HEENT: Unremarkable Neck:  No JVD, no thyromegally Back:  No CVA tenderness Lungs:  Clear with no wheezes HEART:  IRegular rate rhythm, no murmurs, no rubs, no clicks, bigeminy Abd:  soft, positive bowel sounds, no organomegally, no rebound, no guarding Ext:  2 plus pulses, no edema, no cyanosis, no clubbing Skin:  No rashes no nodules Neuro:  CN II through  XII intact, motor grossly intact    Assess/Plan:

## 2014-12-17 NOTE — Assessment & Plan Note (Signed)
I suspect this is related to his PVC"s. He will continue his current meds.

## 2014-12-17 NOTE — Patient Instructions (Signed)
Your physician has recommended you make the following change in your medication:  1.) START FLECAINIDE 75 MG (ONE AND HALF TABLETS) TWO TIMES PER DAY  Your physician has requested that you have an exercise tolerance test. For further information please visit [email or web address]. Please also follow instruction sheet, as given.  Your physician recommends that you schedule a follow-up appointment in: [place].

## 2014-12-18 ENCOUNTER — Telehealth: Payer: Self-pay | Admitting: Internal Medicine

## 2014-12-18 NOTE — Telephone Encounter (Signed)
Returned call to Quita Skye. Informed him OK to Rx under Dr. Debara Pickett in order for patient to get medication.

## 2014-12-18 NOTE — Telephone Encounter (Signed)
Eduardo West called in wanting to see if Dr. Debara Pickett would be willing to sign off on pt's flecainide prescription because Dr.Taylor is not medicaid approved and it is keeping to pt from receiving this medication. Please call  Thanks

## 2014-12-22 ENCOUNTER — Encounter: Payer: Self-pay | Admitting: Internal Medicine

## 2014-12-22 NOTE — Assessment & Plan Note (Addendum)
L Pleural effusion developed post op 12/2009 p hemicolectomy - Tcentesis June 06, 2010 600 cc serous yellow exudate 94% L> mature lymphocytes  - Small/ moderated residual effusion July 24, 2010 >> lat decub no sign layering 09/05/10 > improved 05/06/2011  > no change 08/23/2012  - ESR 55 > 40 > 29 October 09, 2010>   ESR  Now down to 12  - cxr  >> no change     At this point the effusion/ scarring is permanent and not felt to be of clinical significance >> no further w/u or f/u planned

## 2014-12-22 NOTE — Assessment & Plan Note (Signed)
-   08/10/2011  Walked RA x 2laps @ 185 ft each stopped due to fatigue and leg pains, no desat     - 08/23/2012   Walked RA x one lap @ 185 stopped due to  Tired, not really tachypeic, tachycardic nor desat    - 12/05/2014  Walked RA  2 laps @ 185 ft each stopped due to  Dizzy, tired, not obviously tachypneic, no desat @ mod pace   Not able to reproduce his chronic doe at time of ov, unlikely further w/u would be helpful > referred back to primary care

## 2015-01-12 IMAGING — CR DG CHEST 2V
2 series · 2 of 2 positions shown · non-contrast
Comparison: DG CHEST 2 VIEW dated 12/25/2013

CLINICAL DATA: Weakness, congestion, clinical concern of
pneumonitis

EXAM:
CHEST  2 VIEW

[w chest pa]
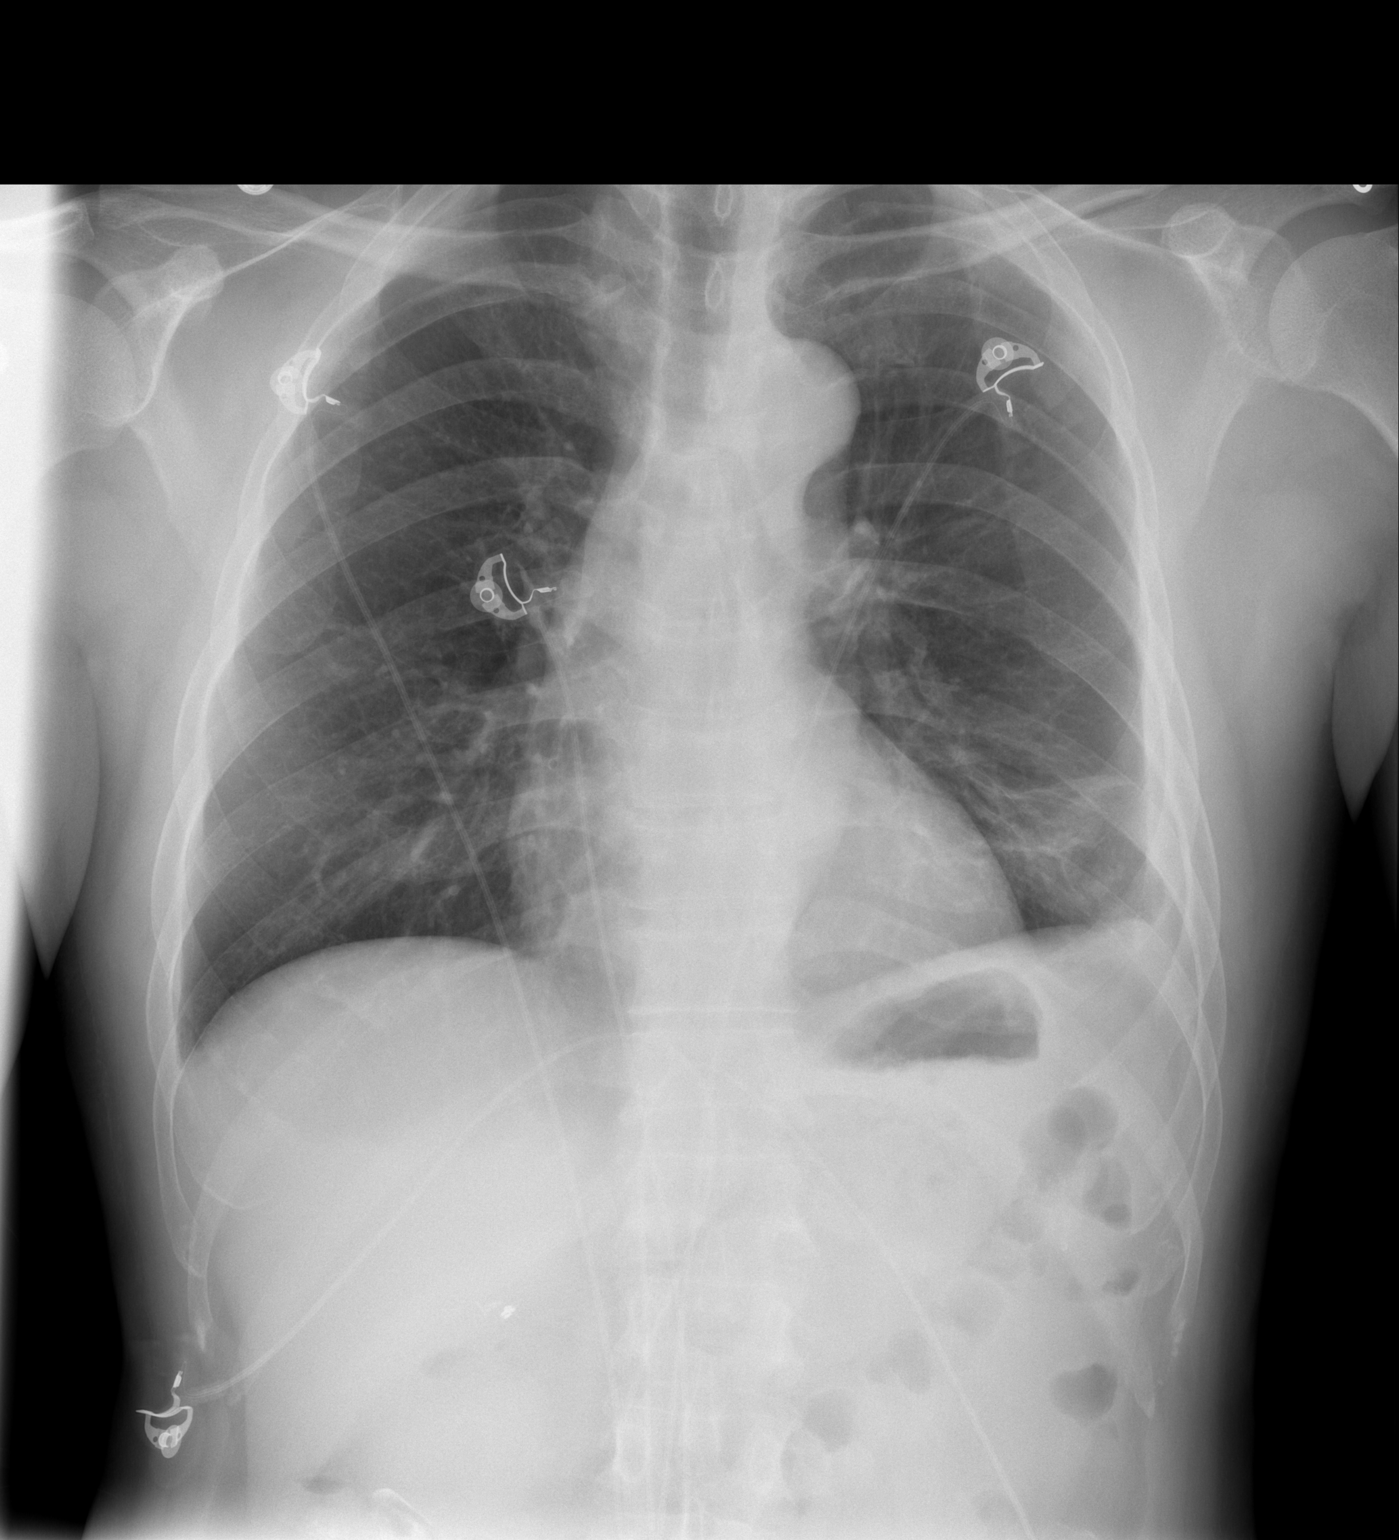

[w chest lat]
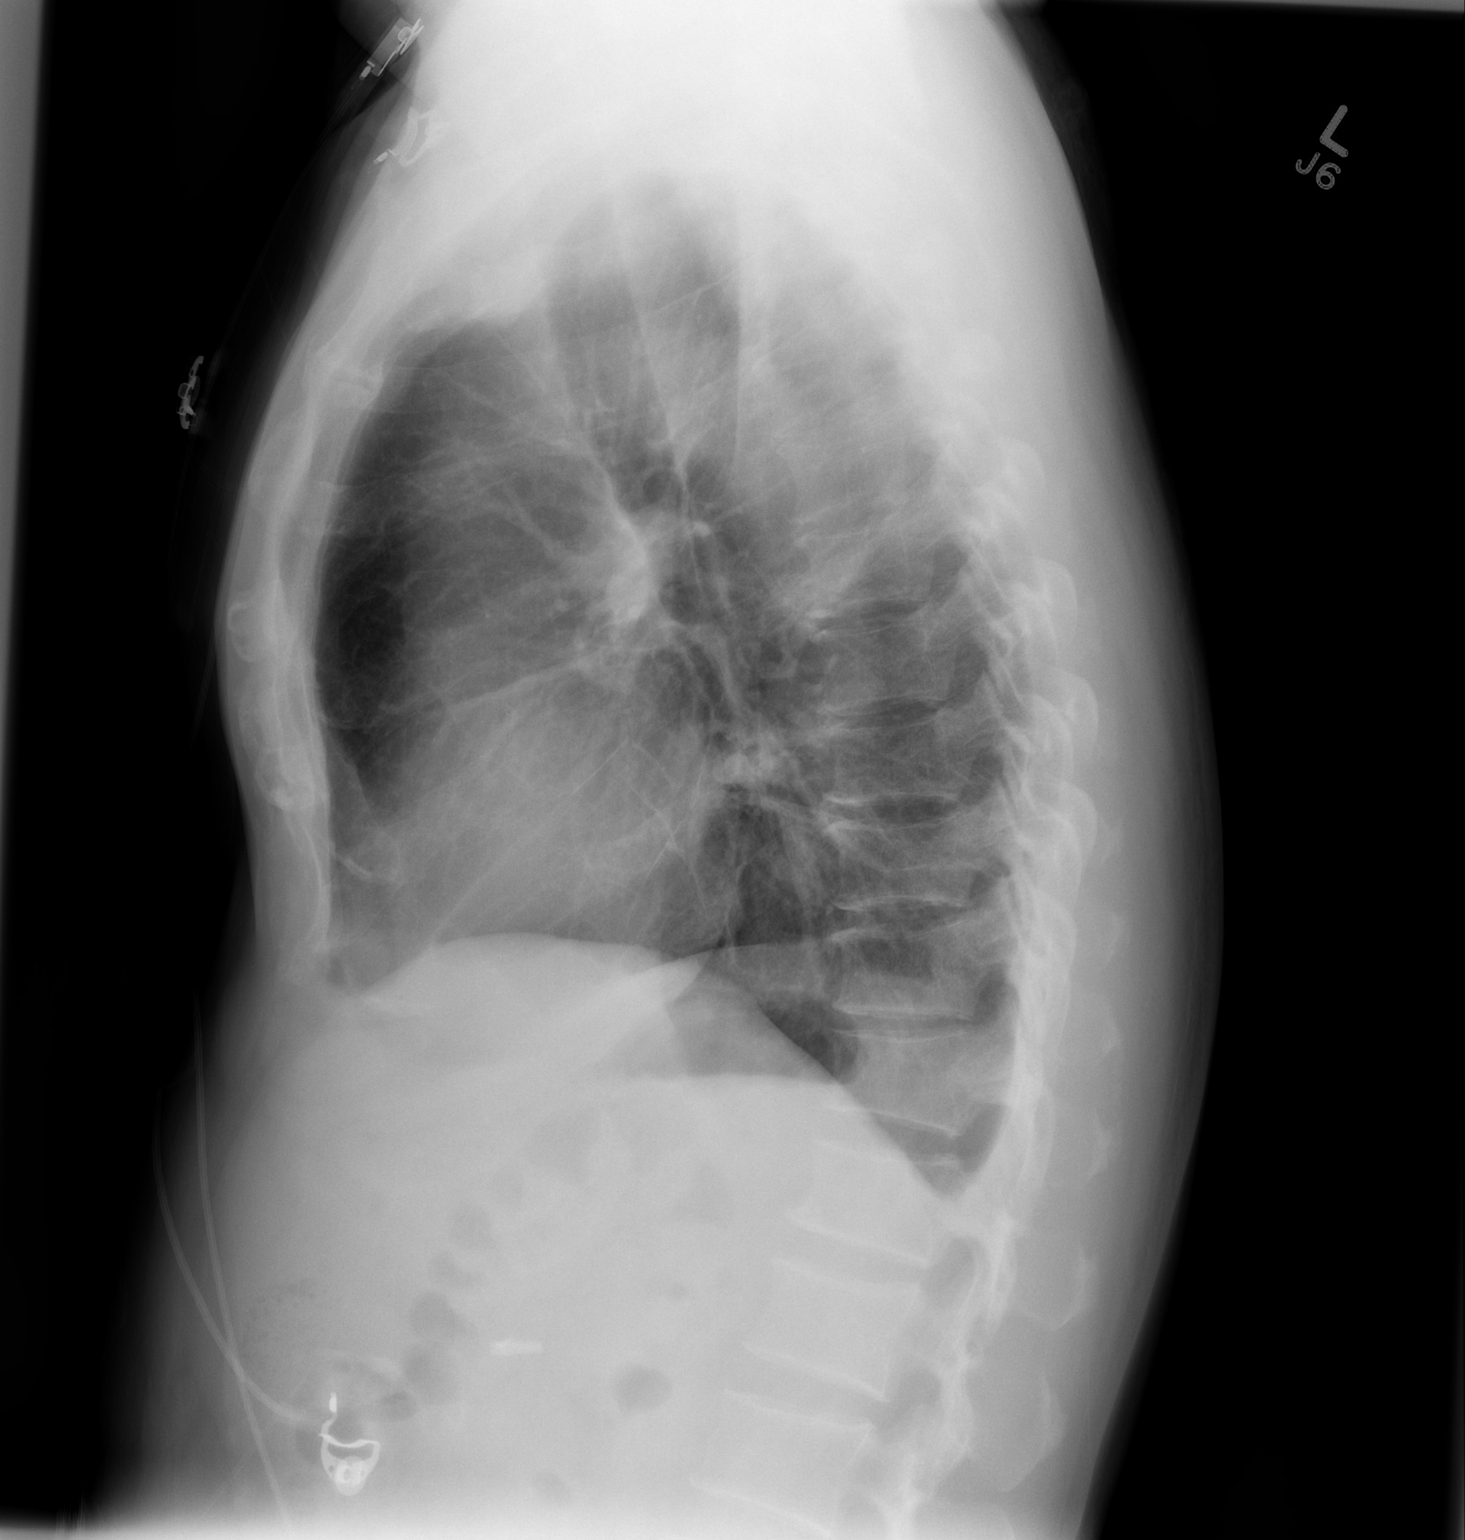

[2 of 2 positions shown; findings below may reference images not displayed]

FINDINGS: The heart size and mediastinal contours are within normal limits.
There is slight increase conspicuity of the left lower lobe
infiltrate. Blunting of the left costophrenic angle is appreciated
stable when compared to the previous study. No new focal regions of
consolidation or new focal infiltrates. Osseous structures
unremarkable.
IMPRESSION: Stable left lower lobe infiltrate and stable small effusion.

## 2015-02-14 ENCOUNTER — Ambulatory Visit (INDEPENDENT_AMBULATORY_CARE_PROVIDER_SITE_OTHER): Payer: Medicaid Other | Admitting: Physician Assistant

## 2015-02-14 ENCOUNTER — Encounter: Payer: Self-pay | Admitting: Internal Medicine

## 2015-02-14 DIAGNOSIS — Z79899 Other long term (current) drug therapy: Secondary | ICD-10-CM

## 2015-02-14 DIAGNOSIS — Z5181 Encounter for therapeutic drug level monitoring: Secondary | ICD-10-CM

## 2015-02-14 NOTE — Progress Notes (Signed)
Exercise Treadmill Test  Pre-Exercise Testing Evaluation Rhythm: normal sinus  Rate: 79 bpm     Test  Exercise Tolerance Test Ordering MD: Cristopher Peru, MD  Interpreting MD: Richardson Dopp, PA-C  Unique Test No: 1  Treadmill:  1  Indication for ETT: flecainide  Contraindication to ETT: No   Stress Modality: exercise - treadmill  Cardiac Imaging Performed: non   Protocol: standard Bruce - maximal  Max BP:  189/96  Max MPHR (bpm):  169 85% MPR (bpm):  144  MPHR obtained (bpm):  125 % MPHR obtained:  74  Reached 85% MPHR (min:sec):  n/a Total Exercise Time (min-sec):  6:42  Workload in METS:  7.5 Borg Scale: 14  Reason ETT Terminated:  ataxia    ST Segment Analysis At Rest: normal ST segments - no evidence of significant ST depression With Exercise: non-specific ST changes  Other Information Arrhythmia:  No Angina during ETT:  absent (0) Quality of ETT:  non-diagnostic  ETT Interpretation:  normal - no evidence of ischemia by ST analysis at submaximal exercise.  Comments: Fair exercise capacity.  Exercise limited by old leg injury. No chest pain.  He did complain of some dizziness.  Normal BP response to exercise. No ST changes to suggest ischemia at submaximal exercise (did not achieve 85% age [facility]). No exercise induced ventricular arrhythmias.  Few PVCs noted throughout exercise and rest.   Recommendations: He complains of some dizziness since starting Flecainide that only occurs with emotional stress (? Vagal). I have asked him to review this with Dr. Lovena Le at [facility]. FU with Dr. Lovena Le as planned.  Signed,  Richardson Dopp, PA-C   02/14/2015 11:02 AM

## 2015-02-19 ENCOUNTER — Ambulatory Visit (INDEPENDENT_AMBULATORY_CARE_PROVIDER_SITE_OTHER): Payer: Medicaid Other | Admitting: Internal Medicine

## 2015-02-19 ENCOUNTER — Encounter: Payer: Self-pay | Admitting: Internal Medicine

## 2015-02-19 VITALS — BP 124/86 | HR 67 | Ht 63.0 in | Wt 150.6 lb

## 2015-02-19 DIAGNOSIS — I10 Essential (primary) hypertension: Secondary | ICD-10-CM

## 2015-02-19 DIAGNOSIS — R21 Rash and other nonspecific skin eruption: Secondary | ICD-10-CM | POA: Diagnosis not present

## 2015-02-19 DIAGNOSIS — I498 Other specified cardiac arrhythmias: Secondary | ICD-10-CM

## 2015-02-19 DIAGNOSIS — I499 Cardiac arrhythmia, unspecified: Secondary | ICD-10-CM | POA: Diagnosis not present

## 2015-02-19 MED ORDER — FLECAINIDE ACETATE 100 MG PO TABS: 100.0000 mg | ORAL_TABLET | Freq: Two times a day (BID) | ORAL | Status: DC

## 2015-02-19 NOTE — Assessment & Plan Note (Signed)
He has no obvious erythematous or macular or papular lesions on his arms today. I suspect his rash is due to's soap he uses which is new and coincides with the development of the skin eruptions. I've asked the patient to stop his soap and switched to another brand. He could've had a rash caused by a medication, and if his rash continues he should seek medical attention with his primary physician.

## 2015-02-19 NOTE — Assessment & Plan Note (Signed)
His blood pressure is well controlled. He will continue his current medical therapy. He is encouraged to reduce his salt intake.

## 2015-02-19 NOTE — Patient Instructions (Signed)
Your physician has recommended you make the following change in your medication:   Increase Flecainide 100 mg by mouth twice daily  Your physician wants you to follow-up in: 6 month with Dr. Lovena Le. You will receive a reminder letter in the mail two months in advance. If you don't receive a letter, please call our office to schedule the follow-up appointment.

## 2015-02-19 NOTE — Assessment & Plan Note (Signed)
His symptoms are improved but not resolved. I recommended that he increase his dose of flecainide from 75 mg twice daily to 100 mg twice daily. We'll see him back in several months.

## 2015-02-19 NOTE — Progress Notes (Signed)
HPI Mr. Blaney returns today for follow-up. He is a 54 year old [place] man with a history of palpitations and documented PVCs in a bigeminal fashion. He had negative stress test and a negative echo in the past. When I saw the patient approximately 2 months ago, he was started on flecainide 75 mg twice a day. His symptoms have improved, but he still has occasional episodes where he feels his heart skipping and beating irregularly. This appears to be bothersome to him. He has had no syncope. He denies peripheral edema. He does note a rash which occurs after bathing. No Known Allergies   Current Outpatient Prescriptions  Medication Sig Dispense Refill  . flecainide (TAMBOCOR) 100 MG tablet Take 1 tablet (100 mg total) by mouth 2 (two) times daily. 90 tablet 3  . tamsulosin (FLOMAX) 0.4 MG CAPS capsule Take 1 capsule by mouth daily.  11  . triamterene-hydrochlorothiazide (DYAZIDE) 50-25 MG per capsule Take 1 capsule by mouth daily.  5   No current facility-administered medications for this visit.     Past Medical History  Diagnosis Date  . Diverticulitis of cecum 12/2009    Resected 2011:  COLON, SEGMENTAL RESECTION, RIGHT AND TERMINAL ILEUM : - SEGMENT OF COLON  . Pleural effusion, left   . Kidney stones   . 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 Hypertension   . Diarrhea   . Abdominal pain   . Buzzing in ear   . Right leg numbness   . Chronic diarrhea 03/04/2012  . Colon polyp     ROS:   All systems reviewed and negative except as noted in the HPI.   Past Surgical History  Procedure Laterality Date  . Cholecystectomy  12/2009    Dr. Excell Seltzer  . Appendectomy  2003    Dr. Marlou Starks:  Attempted laparoscopic and subsequent open appendectomy  . Right colectomy  12/2009    Dr Excell Seltzer: open resection for cecal diverticulitis  . Colon surgery    . Flexible sigmoidoscopy N/A 09/13/2013    Procedure: FLEXIBLE SIGMOIDOSCOPY;  Surgeon: Leighton Ruff, MD;  Location: [facility];  Service:  Endoscopy;  Laterality: N/A;     Family History  Problem Relation Age of Onset  . Cancer Mother      History   Social History  . Marital Status: Single    Spouse Name: N/A  . Number of Children: 2  . Years of Education: 8th   Occupational History  . unemployed    Social History Main Topics  . Smoking status: Former Smoker -- 0.50 packs/day for 20 years    Quit date: 12/01/2007  . Smokeless tobacco: Never Used  . Alcohol Use: No  . Drug Use: No  . Sexual Activity: Not on file   Other Topics Concern  . Not on file   Social History Narrative     BP 124/86 mmHg  Pulse 67  Ht 5\' 3"  (1.6 m)  Wt 150 lb 9.6 oz (68.312 kg)  BMI 26.68 kg/m2  Physical Exam:  Well appearing 54 year old man, NAD HEENT: Unremarkable Neck:  7 cm JVD, no thyromegally Back:  No CVA tenderness Lungs:  Clear with no wheezes, rales, or rhonchi. HEART:  Regular rate rhythm, no murmurs, no rubs, no clicks Abd:  soft, positive bowel sounds, no organomegally, no rebound, no guarding Ext:  2 plus pulses, no edema, no cyanosis, no clubbing Skin:  No rashes no nodules Neuro:  CN II through XII intact, motor grossly intact  EKG -  normal sinus rhythm   Assess/Plan:

## 2015-05-03 ENCOUNTER — Encounter: Payer: Self-pay | Admitting: Internal Medicine

## 2015-05-03 ENCOUNTER — Ambulatory Visit (INDEPENDENT_AMBULATORY_CARE_PROVIDER_SITE_OTHER): Payer: Medicaid Other | Admitting: Internal Medicine

## 2015-05-03 VITALS — BP 128/92 | HR 63 | Ht 63.0 in | Wt 150.6 lb

## 2015-05-03 DIAGNOSIS — I498 Other specified cardiac arrhythmias: Secondary | ICD-10-CM

## 2015-05-03 DIAGNOSIS — R5383 Other fatigue: Secondary | ICD-10-CM | POA: Diagnosis not present

## 2015-05-03 DIAGNOSIS — R0600 Dyspnea, unspecified: Secondary | ICD-10-CM | POA: Diagnosis not present

## 2015-05-03 DIAGNOSIS — I499 Cardiac arrhythmia, unspecified: Secondary | ICD-10-CM | POA: Diagnosis not present

## 2015-05-03 MED ORDER — OMEPRAZOLE 20 MG PO CPDR: 20.0000 mg | DELAYED_RELEASE_CAPSULE | Freq: Every day | ORAL | Status: DC

## 2015-05-03 MED ORDER — FLECAINIDE ACETATE 100 MG PO TABS: 100.0000 mg | ORAL_TABLET | Freq: Two times a day (BID) | ORAL | Status: DC

## 2015-05-03 MED ORDER — TRIAMTERENE-HCTZ 50-25 MG PO CAPS: 1.0000 | ORAL_CAPSULE | Freq: Every day | ORAL | Status: DC

## 2015-05-03 NOTE — Patient Instructions (Addendum)
Your physician wants you to follow-up in: 6 months with Dr. Debara Pickett. You will receive a reminder letter in the mail two months in advance. If you don't receive a letter, please call our office to schedule the follow-up appointment.  Your medications have been refilled.

## 2015-05-03 NOTE — Progress Notes (Signed)
OFFICE NOTE  Chief Complaint:  No complaints  Primary Care Physician: Harvie Junior, MD  HPI:  Eduardo West is a pleasant 54 year old [name] he is male who is seen today with a Optometrist. He is referred by Dr. Jimmye Norman for evaluation of an abnormal heart rhythm. EKG today shows ventricular bigeminy with ST and T wave abnormalities laterally concerning for ischemia. Eduardo West reports some increasing chest discomfort with exertion as well as shortness of breath. He says that he typically walks 30-45 minutes almost every day but recently has had worsening shortness of breath and his fatigue after only 5-10 minutes. He says that his chest discomfort is worse when exerting himself and released somewhat by rest. There is no significant family history of heart disease per his report although hypertension is his family. He was recently hospitalized for small bowel obstruction and has improved. He has had partial colon resection as well as appendectomy in the past. In January 2015 while hospitalized he was seen for chest pain by Dr. Pernell Dupre. Echocardiogram was recommended which showed an EF of 60-65% with mild diastolic dysfunction and moderate concentric LVH. There were no segmental wall motion abnormalities. He reports his symptoms have progressively gotten worse since his recent hospitalization.  Eduardo West returns for follow-up. At his last office visit I started him on low-dose beta blocker and he remains in ventricular bigeminy today. The electrical rate is 68 however his pulse rate is half of that at 34. He cannot express whether he has more fatigue or chest discomfort on the medicine than he had before. He certainly does not feel in any improvement on the beta blocker. Pulse rate may be slower.  I had the pleasure seeing Eduardo West back in the office today. In the interim he seen Dr. Cristopher Peru twice, who started him on flecainide and increased dose to 100 mg twice a day. This seems to  have been effective in suppressing his PVCs. He does feel somewhat better and has more energy and is less short of breath.  PMHx:  Past Medical History  Diagnosis Date  . Diverticulitis of cecum 12/2009    Resected 2011:  COLON, SEGMENTAL RESECTION, RIGHT AND TERMINAL ILEUM : - SEGMENT OF COLON  . Pleural effusion, left   . Kidney stones   . 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 Hypertension   . Diarrhea   . Abdominal pain   . Buzzing in ear   . Right leg numbness   . Chronic diarrhea 03/04/2012  . Colon polyp     Past Surgical History  Procedure Laterality Date  . Cholecystectomy  12/2009    Dr. Excell Seltzer  . Appendectomy  2003    Dr. Marlou Starks:  Attempted laparoscopic and subsequent open appendectomy  . Right colectomy  12/2009    Dr Excell Seltzer: open resection for cecal diverticulitis  . Colon surgery    . Flexible sigmoidoscopy N/A 09/13/2013    Procedure: FLEXIBLE SIGMOIDOSCOPY;  Surgeon: Leighton Ruff, MD;  Location: [facility];  Service: Endoscopy;  Laterality: N/A;    FAMHx:  Family History  Problem Relation Age of Onset  . Cancer Mother     SOCHx:   reports that he quit smoking about 7 years ago. He has never used smokeless tobacco. He reports that he does not drink alcohol or use illicit drugs.  ALLERGIES:  No Known Allergies  ROS: A comprehensive review of systems was negative.  HOME MEDS: Current Outpatient Prescriptions  Medication Sig Dispense Refill  .  flecainide (TAMBOCOR) 100 MG tablet Take 1 tablet (100 mg total) by mouth 2 (two) times daily. 180 tablet 1  . omeprazole (PRILOSEC) 20 MG capsule Take 1 capsule (20 mg total) by mouth daily. 90 capsule 1  . tamsulosin (FLOMAX) 0.4 MG CAPS capsule Take 1 capsule by mouth daily.  11  . triamterene-hydrochlorothiazide (DYAZIDE) 50-25 MG per capsule Take 1 capsule by mouth daily. 90 capsule 1   No current facility-administered medications for this visit.    LABS/IMAGING: No results found for this or any previous visit (from  the past 48 hour(s)). No results found.  VITALS: BP 128/92 mmHg  Pulse 63  Ht 5\' 3"  (1.6 m)  Wt 150 lb 9.6 oz (68.312 kg)  BMI 26.68 kg/m2  EXAM: deferred  EKG: Sinus rhythm with first degree AV block at 63, incomplete right bundle branch pattern, QTC 427 ms   ASSESSMENT: 1. Ventricular bigeminy with nonconducted PVCs - suppressed on flecainide 2. Chest pain with exertion - low risk nuclear stress test 3. Dyspnea on exertion - improved 4. Hypertension  PLAN: 1.   Eduardo West had a low-risk nuclear stress test. He seemed to be intolerant of beta blockade. He was started on flecainide for better suppression of his PVCs and that seems to be working. He feels stronger now and is less short of breath. EKG appears stable and I recommend he continue on flecainide 100 mg twice daily. We've renewed his prescription and I plan to follow him back in the office in 6 months.  Pixie Casino, MD, [facility] Attending Cardiologist [place] 05/03/2015, 1:05 PM

## 2015-10-30 ENCOUNTER — Encounter: Payer: Self-pay | Admitting: Internal Medicine

## 2015-10-30 ENCOUNTER — Ambulatory Visit (INDEPENDENT_AMBULATORY_CARE_PROVIDER_SITE_OTHER): Payer: Medicaid Other | Admitting: Internal Medicine

## 2015-10-30 VITALS — BP 114/82 | HR 70 | Ht 63.0 in | Wt 152.4 lb

## 2015-10-30 DIAGNOSIS — I499 Cardiac arrhythmia, unspecified: Secondary | ICD-10-CM | POA: Diagnosis not present

## 2015-10-30 DIAGNOSIS — I493 Ventricular premature depolarization: Secondary | ICD-10-CM | POA: Diagnosis not present

## 2015-10-30 DIAGNOSIS — I498 Other specified cardiac arrhythmias: Secondary | ICD-10-CM

## 2015-10-30 DIAGNOSIS — I10 Essential (primary) hypertension: Secondary | ICD-10-CM

## 2015-10-30 DIAGNOSIS — R0789 Other chest pain: Secondary | ICD-10-CM | POA: Diagnosis not present

## 2015-10-30 DIAGNOSIS — R0609 Other forms of dyspnea: Secondary | ICD-10-CM

## 2015-10-30 MED ORDER — FLECAINIDE ACETATE 100 MG PO TABS: 100.0000 mg | ORAL_TABLET | Freq: Two times a day (BID) | ORAL | Status: DC

## 2015-10-30 MED ORDER — TRIAMTERENE-HCTZ 50-25 MG PO CAPS: 1.0000 | ORAL_CAPSULE | Freq: Every day | ORAL | Status: DC

## 2015-10-30 NOTE — Patient Instructions (Signed)
Your physician has requested that you have an exercise tolerance test. This will be done in 1 year.  Your physician wants you to follow-up in:1 year or sooner if needed. You will receive a reminder letter in the mail two months in advance. If you don't receive a letter, please call our office to schedule the follow-up appointment.

## 2015-10-30 NOTE — Progress Notes (Signed)
OFFICE NOTE  Chief Complaint:  No complaints  Primary Care Physician: Harvie Junior, MD  HPI:  Eduardo West is a pleasant 54 year old [name] he is male who is seen today with a Optometrist. He is referred by Dr. Jimmye Norman for evaluation of an abnormal heart rhythm. EKG today shows ventricular bigeminy with ST and T wave abnormalities laterally concerning for ischemia. Eduardo West reports some increasing chest discomfort with exertion as well as shortness of breath. He says that he typically walks 30-45 minutes almost every day but recently has had worsening shortness of breath and his fatigue after only 5-10 minutes. He says that his chest discomfort is worse when exerting himself and released somewhat by rest. There is no significant family history of heart disease per his report although hypertension is his family. He was recently hospitalized for small bowel obstruction and has improved. He has had partial colon resection as well as appendectomy in the past. In January 2015 while hospitalized he was seen for chest pain by Dr. Pernell Dupre. Echocardiogram was recommended which showed an EF of 60-65% with mild diastolic dysfunction and moderate concentric LVH. There were no segmental wall motion abnormalities. He reports his symptoms have progressively gotten worse since his recent hospitalization.  Eduardo West returns for follow-up. At his last office visit I started him on low-dose beta blocker and he remains in ventricular bigeminy today. The electrical rate is 68 however his pulse rate is half of that at 34. He cannot express whether he has more fatigue or chest discomfort on the medicine than he had before. He certainly does not feel in any improvement on the beta blocker. Pulse rate may be slower.  I had the pleasure seeing Eduardo West back in the office today. In the interim he seen Dr. Cristopher Peru twice, who started him on flecainide and increased dose to 100 mg twice a day. This seems to  have been effective in suppressing his PVCs. He does feel somewhat better and has more energy and is less short of breath.  I saw Eduardo West back in the office today with an interpreter. Overall he is doing fairly well. He is currently on flecainide 100 mg twice a day. He reports nearly complete resolution of his palpitations. His EKG shows no evidence of bigeminy. He says he gets occasional sharp chest pain but no significant symptoms that sound anginal. He also has some mild shortness of breath. He underwent a stress test last year which was negative for ischemia. Blood pressure appears well controlled on Dyazide.  PMHx:  Past Medical History  Diagnosis Date  . Diverticulitis of cecum 12/2009    Resected 2011:  COLON, SEGMENTAL RESECTION, RIGHT AND TERMINAL ILEUM : - SEGMENT OF COLON  . Pleural effusion, left   . Kidney stones   . 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 Hypertension   . Diarrhea   . Abdominal pain   . Buzzing in ear   . Right leg numbness   . Chronic diarrhea 03/04/2012  . Colon polyp     Past Surgical History  Procedure Laterality Date  . Cholecystectomy  12/2009    Dr. Excell Seltzer  . Appendectomy  2003    Dr. Marlou Starks:  Attempted laparoscopic and subsequent open appendectomy  . Right colectomy  12/2009    Dr Excell Seltzer: open resection for cecal diverticulitis  . Colon surgery    . Flexible sigmoidoscopy N/A 09/13/2013    Procedure: FLEXIBLE SIGMOIDOSCOPY;  Surgeon: Leighton Ruff, MD;  Location:  [facility];  Service: Endoscopy;  Laterality: N/A;    FAMHx:  Family History  Problem Relation Age of Onset  . Cancer Mother     SOCHx:   reports that he quit smoking about 7 years ago. He has never used smokeless tobacco. He reports that he does not drink alcohol or use illicit drugs.  ALLERGIES:  No Known Allergies  ROS: A comprehensive review of systems was negative.  HOME MEDS: Current Outpatient Prescriptions  Medication Sig Dispense Refill  . flecainide (TAMBOCOR) 100 MG  tablet Take 1 tablet (100 mg total) by mouth 2 (two) times daily. 180 tablet 1  . tamsulosin (FLOMAX) 0.4 MG CAPS capsule Take 1 capsule by mouth daily.  11  . triamterene-hydrochlorothiazide (DYAZIDE) 50-25 MG capsule Take 1 capsule by mouth daily. 90 capsule 1   No current facility-administered medications for this visit.    LABS/IMAGING: No results found for this or any previous visit (from the past 48 hour(s)). No results found.  VITALS: BP 114/82 mmHg  Pulse 70  Ht 5\' 3"  (1.6 m)  Wt 152 lb 6.4 oz (69.128 kg)  BMI 27.00 kg/m2  EXAM: General appearance: alert and no distress Neck: no carotid bruit and no JVD Lungs: clear to auscultation bilaterally Heart: regular rate and rhythm, S1, S2 normal, no murmur, click, rub or gallop Abdomen: soft, non-tender; bowel sounds normal; no masses,  no organomegaly Extremities: extremities normal, atraumatic, no cyanosis or edema Pulses: 2+ and symmetric Skin: Skin color, texture, turgor normal. No rashes or lesions Neurologic: Grossly normal Psych: Pleasant  EKG: Normal sinus rhythm at 70, nonspecific T wave changes  ASSESSMENT: 1. Ventricular bigeminy with nonconducted PVCs - suppressed on flecainide 2. Chest pain with exertion - low risk nuclear stress test (2015) 3. Dyspnea on exertion - improved 4. Hypertension  PLAN: 1.   Eduardo West has had suppression of ventricular bigeminy with flecainide. He had a low risk nuclear stress test last year. Blood pressure is at goal. His shortness of breath is improved. He occasionally gets some mild chest discomfort which is noncardiac. QTC is stable on flecainide. Plan to see him back in one year and we'll repeat an exercise treadmill stress test at that time (for monitoring of antiarrhythmic therapy).  Pixie Casino, MD, [facility] Attending Cardiologist [place] 10/30/2015, 1:29 PM

## 2016-03-26 ENCOUNTER — Telehealth: Payer: Self-pay | Admitting: Internal Medicine

## 2016-03-26 ENCOUNTER — Encounter (INDEPENDENT_AMBULATORY_CARE_PROVIDER_SITE_OTHER): Payer: Medicaid Other

## 2016-03-26 DIAGNOSIS — I498 Other specified cardiac arrhythmias: Secondary | ICD-10-CM

## 2016-03-26 DIAGNOSIS — I493 Ventricular premature depolarization: Secondary | ICD-10-CM | POA: Diagnosis not present

## 2016-03-26 DIAGNOSIS — I499 Cardiac arrhythmia, unspecified: Secondary | ICD-10-CM | POA: Diagnosis not present

## 2016-03-26 LAB — EXERCISE TOLERANCE TEST
CHL CUP RESTING HR STRESS: 69 {beats}/min
CHL CUP STRESS STAGE 1 DBP: 86 mmHg
CHL CUP STRESS STAGE 1 SBP: 133 mmHg
CHL CUP STRESS STAGE 1 SPEED: 0 mph
CHL CUP STRESS STAGE 2 SPEED: 1 mph
CHL CUP STRESS STAGE 3 SPEED: 1 mph
CHL CUP STRESS STAGE 4 DBP: 93 mmHg
CHL CUP STRESS STAGE 4 GRADE: 10 %
CHL CUP STRESS STAGE 4 HR: 104 {beats}/min
CHL CUP STRESS STAGE 4 SBP: 145 mmHg
CHL CUP STRESS STAGE 4 SPEED: 1.7 mph
CHL CUP STRESS STAGE 5 HR: 125 {beats}/min
CHL CUP STRESS STAGE 5 SBP: 159 mmHg
CHL CUP STRESS STAGE 6 SPEED: 3.4 mph
CHL CUP STRESS STAGE 8 DBP: 84 mmHg
CHL CUP STRESS STAGE 8 GRADE: 0 %
CHL CUP STRESS STAGE 8 HR: 85 {beats}/min
CSEPED: 6 min
CSEPEDS: 53 s
CSEPEW: 8.3 METS
MPHR: 167 {beats}/min
Peak HR: 130 {beats}/min
Percent HR: 79 %
Percent of predicted max HR: 77 %
RPE: 17
Stage 1 Grade: 0 %
Stage 1 HR: 80 {beats}/min
Stage 2 Grade: 0 %
Stage 2 HR: 76 {beats}/min
Stage 3 Grade: 0.1 %
Stage 3 HR: 75 {beats}/min
Stage 5 DBP: 89 mmHg
Stage 5 Grade: 12 %
Stage 5 Speed: 2.5 mph
Stage 6 Grade: 14 %
Stage 6 HR: 130 {beats}/min
Stage 7 DBP: 77 mmHg
Stage 7 Grade: 0 %
Stage 7 HR: 108 {beats}/min
Stage 7 SBP: 129 mmHg
Stage 7 Speed: 0 mph
Stage 8 SBP: 131 mmHg
Stage 8 Speed: 0 mph

## 2016-03-26 MED ORDER — CHLORTHALIDONE 25 MG PO TABS: 25.0000 mg | ORAL_TABLET | Freq: Every day | ORAL | Status: DC

## 2016-03-26 NOTE — Telephone Encounter (Signed)
Message routed to [facility] pool (clinical pharmacists) & Dr. Debara Pickett to advise on alternative as dose Rx'ed is not manufactured.

## 2016-03-26 NOTE — Telephone Encounter (Signed)
Let's have patient switch to Chlorothalidone 25mg  daily and schedule for blood pressure check in HTN clinic in 3-4 weeks. Thanks!

## 2016-03-26 NOTE — Telephone Encounter (Signed)
Called in Rx to pharmacy.  Staff message sent to NL scheduling pool to arrange 3-4 week BP check appt.  LM for patient on VM (OK per DPR) with med change instructions and need for BP check appt.

## 2016-03-26 NOTE — Telephone Encounter (Signed)
Could switch to chlorthalidone 25 mg daily.  Dr. Lemmie Evens

## 2016-03-26 NOTE — Telephone Encounter (Signed)
Melanie(Tech) called in stating that the manufacture no longer carries the Triametrene- HCTZ 50/25mg  . So she would like to know if there is another medication that can be called in for this pt.   Thanks

## 2016-03-27 ENCOUNTER — Telehealth: Payer: Self-pay | Admitting: Internal Medicine

## 2016-03-27 NOTE — Telephone Encounter (Signed)
Closed encounter °

## 2016-03-27 NOTE — Telephone Encounter (Signed)
Patient scheduled for appt 04/23/16 for BP check

## 2016-03-27 NOTE — Telephone Encounter (Signed)
Called and left a voicemail for the patient to call back and schedule the 3 to 4 week blood pressure check.

## 2016-03-30 ENCOUNTER — Encounter: Payer: Self-pay | Admitting: Internal Medicine

## 2016-04-23 ENCOUNTER — Ambulatory Visit (INDEPENDENT_AMBULATORY_CARE_PROVIDER_SITE_OTHER): Payer: Medicaid Other | Admitting: Pharmacist

## 2016-04-23 ENCOUNTER — Encounter: Payer: Self-pay | Admitting: Pharmacist Clinician (PhC)/ Clinical Pharmacy Specialist

## 2016-04-23 VITALS — BP 124/86 | Wt 148.4 lb

## 2016-04-23 DIAGNOSIS — I10 Essential (primary) hypertension: Secondary | ICD-10-CM | POA: Diagnosis not present

## 2016-04-23 LAB — BASIC METABOLIC PANEL
BUN: 16 mg/dL (ref 7–25)
CALCIUM: 10 mg/dL (ref 8.6–10.3)
CO2: 31 mmol/L (ref 20–31)
CREATININE: 1.2 mg/dL (ref 0.70–1.33)
Chloride: 98 mmol/L (ref 98–110)
GLUCOSE: 71 mg/dL (ref 65–99)
Potassium: 3.2 mmol/L — ABNORMAL LOW (ref 3.5–5.3)
Sodium: 140 mmol/L (ref 135–146)

## 2016-04-23 NOTE — Patient Instructions (Signed)
Please go to lab to have have blood drawn.  Your Blood pressure is good today at 124/86  Please continue to take your medications as you have.  Follow-up with Dr. Debara Pickett in November.

## 2016-04-23 NOTE — Progress Notes (Signed)
Patient ID: Philip Tisby                 DOB: 06/30/1961                      MRN: [id number]     HPI: Eduardo West is a 55 y.o. male patient of Dr. Debara Pickett who recently changed from triamterene/hctz to chlorthalidone 25mg  daily. He reports he feels exactly the same on the new medication.   Current HTN meds: Chlorthalidone 25mg  in the morning  Previously tried: triamterene/hctz  BP goal: <140/90  Family History: No cardiac history in mother or father.   Social History: Quit smoking several years ago. And endorses no alcohol  Diet: he eats most of his meals at home.  Exercise: does not participate in formal exercise plan.  Home BP readings: does not check at home as he does not own a cuff  Wt Readings from Last 3 Encounters:  10/30/15 152 lb 6.4 oz (69.128 kg)  05/03/15 150 lb 9.6 oz (68.312 kg)  02/19/15 150 lb 9.6 oz (68.312 kg)   BP Readings from Last 3 Encounters:  10/30/15 114/82  05/03/15 128/92  02/19/15 124/86   Pulse Readings from Last 3 Encounters:  10/30/15 70  05/03/15 63  02/19/15 67    Renal function: CrCl cannot be calculated (Unknown ideal weight.).  Past Medical History  Diagnosis Date  . Diverticulitis of cecum 12/2009    Resected 2011:  COLON, SEGMENTAL RESECTION, RIGHT AND TERMINAL ILEUM : - SEGMENT OF COLON  . Pleural effusion, left   . Kidney stones   . 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 Hypertension   . Diarrhea   . Abdominal pain   . Buzzing in ear   . Right leg numbness   . Chronic diarrhea 03/04/2012  . Colon polyp     Current Outpatient Prescriptions on File Prior to Visit  Medication Sig Dispense Refill  . chlorthalidone (HYGROTON) 25 MG tablet Take 1 tablet (25 mg total) by mouth daily. 90 tablet 2  . flecainide (TAMBOCOR) 100 MG tablet Take 1 tablet (100 mg total) by mouth 2 (two) times daily. 180 tablet 1  . tamsulosin (FLOMAX) 0.4 MG CAPS capsule Take 1 capsule by mouth daily.  11   No current facility-administered medications on file prior  to visit.    No Known Allergies   Assessment/Plan: Hypertension:  BP is at goal today. Continue current therapy. BMET today after medication change. Follow-up with Dr. Debara Pickett in November.   Thank you, Lelan Pons. Patterson [place], [place]

## 2016-04-23 NOTE — Assessment & Plan Note (Signed)
BP is at goal today. Continue current therapy. BMET today after medication change. Follow-up with Dr. Debara Pickett in November.

## 2016-04-28 ENCOUNTER — Telehealth: Payer: Self-pay | Admitting: Internal Medicine

## 2016-04-28 DIAGNOSIS — Z79899 Other long term (current) drug therapy: Secondary | ICD-10-CM

## 2016-04-28 MED ORDER — LOSARTAN POTASSIUM 50 MG PO TABS: 50.0000 mg | ORAL_TABLET | Freq: Every day | ORAL | Status: DC

## 2016-04-28 NOTE — Telephone Encounter (Signed)
LM for patient regarding lab work & info below MD would like patient to STOP chlorthalidone (was switched to this from dyazide after pharmacy called and said they no longer carried this med) He would like him to start on losartan 50mg  once daily, have BMET in 1 week after med change, appt with clinical pharmacist for BP check 2 weeks after med change  Losartan ordered BMET ordered Staff message sent to scheduler to arrange BP check

## 2016-04-29 ENCOUNTER — Telehealth: Payer: Self-pay | Admitting: Internal Medicine

## 2016-04-29 ENCOUNTER — Other Ambulatory Visit: Payer: Self-pay | Admitting: *Deleted

## 2016-04-29 MED ORDER — LISINOPRIL 20 MG PO TABS: 20.0000 mg | ORAL_TABLET | Freq: Every day | ORAL | Status: DC

## 2016-04-29 NOTE — Telephone Encounter (Signed)
Eduardo West is stating that the pharmacy called and said that there are some insurance issues with the new prescription that was prescribe and wants to know should he continue to take his current blood pressure medication , until this is sorted out . Please Call   Thanks

## 2016-04-29 NOTE — Telephone Encounter (Signed)
Per [place]Med[place]) formulary - brand Diovan and losartan are preferred ARBs but requires trial and failure of ACE Inhibitor unless contraindicated or adverse event, even when using a preferred product

## 2016-04-29 NOTE — Telephone Encounter (Signed)
Spoke to pharmacist- Losartan  Needs prior authorization # [id number]  Patient's ID # [id number]

## 2016-04-29 NOTE — Telephone Encounter (Signed)
Spoke with MD - since ARB requires trial and failure and/or contraindication of ACE-I, MD would prefer to switch losartan 50mg  to lisinopril 20mg  QD Rx(s) sent to pharmacy electronically.

## 2016-05-20 ENCOUNTER — Ambulatory Visit (INDEPENDENT_AMBULATORY_CARE_PROVIDER_SITE_OTHER): Payer: Medicaid Other | Admitting: Pharmacist Clinician (PhC)/ Clinical Pharmacy Specialist

## 2016-05-20 VITALS — BP 118/80 | HR 64 | Ht 63.0 in | Wt 150.0 lb

## 2016-05-20 DIAGNOSIS — I10 Essential (primary) hypertension: Secondary | ICD-10-CM | POA: Diagnosis not present

## 2016-05-20 NOTE — Patient Instructions (Signed)
None given in writing, patient speaks only [place]

## 2016-05-20 NOTE — Assessment & Plan Note (Signed)
BP good in office today at 118/80.   Patient to continue with lisinopril 20 mg.  He knows to contact the office if he develops any further problems.

## 2016-05-20 NOTE — Progress Notes (Signed)
Patient ID: Eduardo West                 DOB: 1961-03-06                      MRN: [id number]     HPI: Eduardo West is a 55 y.o. male patient of Dr. Debara Pickett here for a follow up blood pressure check.  He was originally on Nutritional therapist, which was discontinued and therefore he was switched to chlorthalidone 25 mg.  A BMET drawn after this change showed a drop in potassium to 3.2.  Chlorthalidone was switched to losartan.  However his insurance would not cover an ARB unless he had tried and failed an ACEI.  Therefore he was finally put on lisinopril 20 mg daily, and has been for about 2-3 weeks.  He reports no problems with this other than occasional dizziness.  The dizziness is less than on previous medications.    Current HTN meds: lisinopril 20 mg in the morning  Previously tried: triamterene/hctz, chlorthalidone (hypokalemia)  BP goal: <140/90  Family History: No cardiac history in mother or father.   Social History: Quit smoking several years ago. And endorses no alcohol  Diet: he eats most of his meals at home.  Exercise: does not participate in formal exercise plan.  Home BP readings: does not check at home as he does not own a cuff  Wt Readings from Last 3 Encounters:  05/20/16 150 lb (68.04 kg)  04/23/16 148 lb 6.4 oz (67.314 kg)  10/30/15 152 lb 6.4 oz (69.128 kg)   BP Readings from Last 3 Encounters:  05/20/16 118/80  04/23/16 124/86  10/30/15 114/82   Pulse Readings from Last 3 Encounters:  05/20/16 64  10/30/15 70  05/03/15 63    Renal function: CrCl cannot be calculated (Patient has no serum creatinine result on file.).  Past Medical History  Diagnosis Date  . Diverticulitis of cecum 12/2009    Resected 2011:  COLON, SEGMENTAL RESECTION, RIGHT AND TERMINAL ILEUM : - SEGMENT OF COLON  . Pleural effusion, left   . Kidney stones   . 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 Hypertension   . Diarrhea   . Abdominal pain   . Buzzing in ear   . Right leg numbness   . Chronic  diarrhea 03/04/2012  . Colon polyp     Current Outpatient Prescriptions on File Prior to Visit  Medication Sig Dispense Refill  . flecainide (TAMBOCOR) 100 MG tablet Take 1 tablet (100 mg total) by mouth 2 (two) times daily. 180 tablet 1  . lisinopril (PRINIVIL,ZESTRIL) 20 MG tablet Take 1 tablet (20 mg total) by mouth daily. 90 tablet 3  . tamsulosin (FLOMAX) 0.4 MG CAPS capsule Take 1 capsule by mouth daily.  11   No current facility-administered medications on file prior to visit.    No Known Allergies   Thank you, [name], PharmD CPP [employer]

## 2016-08-20 ENCOUNTER — Encounter (HOSPITAL_COMMUNITY): Payer: Self-pay | Admitting: Emergency Medicine

## 2016-08-20 ENCOUNTER — Emergency Department (HOSPITAL_COMMUNITY)
Admission: EM | Admit: 2016-08-20 | Discharge: 2016-08-20 | Disposition: A | Discharge status: Home / Self Care | Payer: Medicaid Other | Attending: Emergency Medicine | Admitting: Emergency Medicine

## 2016-08-20 ENCOUNTER — Emergency Department (HOSPITAL_COMMUNITY): Payer: Medicaid Other

## 2016-08-20 DIAGNOSIS — R1031 Right lower quadrant pain: Secondary | ICD-10-CM | POA: Insufficient documentation

## 2016-08-20 DIAGNOSIS — Z79899 Other long term (current) drug therapy: Secondary | ICD-10-CM | POA: Diagnosis not present

## 2016-08-20 DIAGNOSIS — I10 Essential (primary) hypertension: Secondary | ICD-10-CM | POA: Diagnosis not present

## 2016-08-20 DIAGNOSIS — R112 Nausea with vomiting, unspecified: Secondary | ICD-10-CM | POA: Insufficient documentation

## 2016-08-20 DIAGNOSIS — Z87891 Personal history of nicotine dependence: Secondary | ICD-10-CM | POA: Diagnosis not present

## 2016-08-20 DIAGNOSIS — R109 Unspecified abdominal pain: Secondary | ICD-10-CM

## 2016-08-20 LAB — CBC
HCT: 42.3 % (ref 39.0–52.0)
HEMOGLOBIN: 13.8 g/dL (ref 13.0–17.0)
MCH: 31.8 pg (ref 26.0–34.0)
MCHC: 32.6 g/dL (ref 30.0–36.0)
MCV: 97.5 fL (ref 78.0–100.0)
PLATELETS: 307 10*3/uL (ref 150–400)
RBC: 4.34 MIL/uL (ref 4.22–5.81)
RDW: 12.4 % (ref 11.5–15.5)
WBC: 15.5 10*3/uL — ABNORMAL HIGH (ref 4.0–10.5)

## 2016-08-20 LAB — URINALYSIS, ROUTINE W REFLEX MICROSCOPIC
GLUCOSE, UA: NEGATIVE mg/dL
HGB URINE DIPSTICK: NEGATIVE
KETONES UR: 40 mg/dL — AB
LEUKOCYTES UA: NEGATIVE
Nitrite: NEGATIVE
PH: 6 (ref 5.0–8.0)
PROTEIN: NEGATIVE mg/dL
Specific Gravity, Urine: 1.026 (ref 1.005–1.030)

## 2016-08-20 LAB — COMPREHENSIVE METABOLIC PANEL
ALBUMIN: 4.6 g/dL (ref 3.5–5.0)
ALT: 20 U/L (ref 17–63)
AST: 30 U/L (ref 15–41)
Alkaline Phosphatase: 59 U/L (ref 38–126)
Anion gap: 9 (ref 5–15)
BUN: 14 mg/dL (ref 6–20)
CHLORIDE: 105 mmol/L (ref 101–111)
CO2: 24 mmol/L (ref 22–32)
CREATININE: 1.25 mg/dL — AB (ref 0.61–1.24)
Calcium: 9.5 mg/dL (ref 8.9–10.3)
GFR calc non Af Amer: >60 mL/min (ref >60)
Glucose, Bld: 124 mg/dL — ABNORMAL HIGH (ref 65–99)
Potassium: 2.7 mmol/L — CL (ref 3.5–5.1)
SODIUM: 138 mmol/L (ref 135–145)
Total Bilirubin: 2.3 mg/dL — ABNORMAL HIGH (ref 0.3–1.2)
Total Protein: 7.9 g/dL (ref 6.5–8.1)

## 2016-08-20 LAB — I-STAT TROPONIN, ED: Troponin i, poc: 0.01 ng/mL (ref 0.00–0.08)

## 2016-08-20 LAB — LIPASE, BLOOD: LIPASE: 35 U/L (ref 11–51)

## 2016-08-20 LAB — MAGNESIUM: Magnesium: 1.9 mg/dL (ref 1.7–2.4)

## 2016-08-20 MED ORDER — POTASSIUM CHLORIDE IN NACL 20-0.9 MEQ/L-% IV SOLN
Freq: Once | INTRAVENOUS | Status: AC
  Administered 2016-08-20: 10 mL/h via INTRAVENOUS
  Filled 2016-08-20: qty 1000

## 2016-08-20 MED ORDER — IOPAMIDOL (ISOVUE-300) INJECTION 61%
100.0000 mL | Freq: Once | INTRAVENOUS | Status: AC | PRN
  Administered 2016-08-20: 100 mL via INTRAVENOUS

## 2016-08-20 MED ORDER — ONDANSETRON HCL 4 MG/2ML IJ SOLN: 4.0000 mg | Freq: Once | INTRAMUSCULAR | Status: DC

## 2016-08-20 MED ORDER — POTASSIUM CHLORIDE CRYS ER 20 MEQ PO TBCR
40.0000 meq | EXTENDED_RELEASE_TABLET | Freq: Once | ORAL | Status: AC
  Administered 2016-08-20: 40 meq via ORAL
  Filled 2016-08-20: qty 2

## 2016-08-20 MED ORDER — POTASSIUM CHLORIDE IN NACL 20-0.9 MEQ/L-% IV SOLN
Freq: Once | INTRAVENOUS | Status: AC
  Administered 2016-08-20: 11:00:00 via INTRAVENOUS
  Filled 2016-08-20: qty 1000

## 2016-08-20 MED ORDER — ONDANSETRON HCL 4 MG/2ML IJ SOLN
4.0000 mg | Freq: Once | INTRAMUSCULAR | Status: AC
  Administered 2016-08-20: 4 mg via INTRAVENOUS
  Filled 2016-08-20: qty 2

## 2016-08-20 NOTE — ED Notes (Signed)
Language line used.  Eduardo West [phone] (number)

## 2016-08-20 NOTE — ED Triage Notes (Addendum)
Pt BIB EMS from home c/o abdominal pain and nausea; pt gagging in triage and spitting into emesis bag; pt states he had abdominal surgery in 2003 and 2011 and has had chronic diarrhea ever since; surgeries included appendectomy and cholycystectomy; hx of kidney stones also; pt states he usually has a BM 3 times a day but has not been able to go in the past day.

## 2016-08-20 NOTE — ED Notes (Signed)
Patient transported to CT 

## 2016-08-20 NOTE — ED Notes (Signed)
Language line used for discharge.  Patient is A & O x4.

## 2016-08-20 NOTE — Discharge Instructions (Signed)
Follow up with your primary care provider in 2 days to have your potassium and other labs drawn and checked. Return to the emergency department if you are unable to control your abdominal pain at home, have uncontrolled vomiting, have blood in your vomit or stools, you have fevers, chest pain, shortness of breath or any other concerning symptoms.

## 2016-08-20 NOTE — ED Notes (Signed)
PA requested fluids go in within 1 hour.  However, pharmacy said it had to be within 2 hours.  Adjusted rate to be infused accordingly.  533ml per hr.

## 2016-08-20 NOTE — ED Provider Notes (Signed)
[facility] DEPT Provider Note   CSN: [id number] Arrival date & time: 08/20/16  0421     History   Chief Complaint Chief Complaint  Patient presents with  . Abdominal Pain    HPI Eduardo West is a 55 y.o. male.  HPI   Patient is a 55 year old male with a history of SBO, HTN, diverticulitis, kidney stones, who presents to the emergency department with abdominal pain for 2 days. Intermittent sharp abdominal pains, worse with eating with associated abdominal distention. Patient one episode of emesis yesterday morning. Patient states he has a history of abdominal pain with eating and he is taken 3 of his pain medications without relief. Patient states this feels similar to his small bowel obstruction in 2015. Patient states he normally has 4-5 loose bowel movements per day. He's only had 1 today with some relief of his abdominal pain. Associated nausea. Patient denies hematemesis, hematochezia, fevers, chills, chest pain, shortness of breath.  Past Medical History:  Diagnosis Date  . Abdominal pain   . Buzzing in ear   . Chronic diarrhea 03/04/2012  . Colon polyp   . Diarrhea   . Diverticulitis of cecum 12/2009   Resected 2011:  COLON, SEGMENTAL RESECTION, RIGHT AND TERMINAL ILEUM : - SEGMENT OF COLON  . Hypertension   . Kidney stones   . Pleural effusion, left   . 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 Right leg numbness     Patient Active Problem List   Diagnosis Date Noted  . Rash 02/19/2015  . Other fatigue 11/14/2014  . Chest pain 04/27/2014  . DOE (dyspnea on exertion) 04/27/2014  . 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 Aspiration pneumonia ([place]) 12/26/2013  . Pleural effusion 12/26/2013  . Pleuritic chest pain 12/26/2013  . GERD (gastroesophageal reflux disease) 12/26/2013  . Chest pain, atypical 12/25/2013  . ECG abnormality 12/25/2013  . Hypertension   . SBO (small bowel obstruction) ([place]) 10/11/2012  . Chronic abdominal pain 03/04/2012  . Chronic diarrhea 03/04/2012  . Back  pain 02/04/2012  . 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 Dyspnea 08/22/2010  . Pleural effusion on left post op 06/06/2010  . Diverticulitis of cecum 12/31/2009    Past Surgical History:  Procedure Laterality Date  . APPENDECTOMY  2003   Dr. Marlou Starks:  Attempted laparoscopic and subsequent open appendectomy  . CHOLECYSTECTOMY  12/2009   Dr. Excell Seltzer  . COLON SURGERY    . FLEXIBLE SIGMOIDOSCOPY N/A 09/13/2013   Procedure: FLEXIBLE SIGMOIDOSCOPY;  Surgeon: Leighton Ruff, MD;  Location: [facility];  Service: Endoscopy;  Laterality: N/A;  . RIGHT COLECTOMY  12/2009   Dr Excell Seltzer: open resection for cecal diverticulitis       Home Medications    Prior to Admission medications   Medication Sig Start Date End Date Taking? Authorizing Provider  flecainide (TAMBOCOR) 100 MG tablet Take 1 tablet (100 mg total) by mouth 2 (two) times daily. 10/30/15  Yes Pixie Casino, MD  lisinopril (PRINIVIL,ZESTRIL) 20 MG tablet Take 1 tablet (20 mg total) by mouth daily. 04/29/16  Yes Pixie Casino, MD  tamsulosin (FLOMAX) 0.4 MG CAPS capsule Take 1 capsule by mouth daily. 01/29/15  Yes Historical Provider, MD    Family History Family History  Problem Relation Age of Onset  . Cancer Mother     Social History Social History  Substance Use Topics  . Smoking status: Former Smoker    Packs/day: 0.50    Years: 20.00    Quit date: 12/01/2007  . Smokeless tobacco:  Never Used  . Alcohol use No     Allergies   Review of patient's allergies indicates no known allergies.   Review of Systems Review of Systems  Constitutional: Negative for chills and fever.  HENT: Negative for trouble swallowing.   Eyes: Negative for visual disturbance.  Respiratory: Negative for chest tightness and shortness of breath.   Cardiovascular: Negative for chest pain.  Gastrointestinal: Positive for abdominal distention, abdominal pain, nausea and vomiting. Negative for blood in stool and constipation.  Genitourinary:  Negative for dysuria and hematuria.  Musculoskeletal: Negative for back pain.  Skin: Negative for rash.  Neurological: Negative for headaches.     Physical Exam Updated Vital Signs BP 147/89 (BP Location: Left Arm)   Pulse 77   Temp 97.7 F (36.5 C) (Oral)   Resp 22   SpO2 96%   Physical Exam  Constitutional: He appears well-developed and well-nourished. No distress.  HENT:  Head: Normocephalic and atraumatic.  Eyes: Conjunctivae are normal.  Cardiovascular: Normal rate and regular rhythm.  Exam reveals no gallop and no friction rub.   No murmur heard. Pulses:      Radial pulses are 2+ on the right side, and 2+ on the left side.       Dorsalis pedis pulses are 2+ on the right side, and 2+ on the left side.  Pulmonary/Chest: Effort normal. No respiratory distress.  Abdominal: He exhibits no distension. Bowel sounds are decreased.  Roughly 6cm incisional scar noted to mid abdomin from umbilicus to suprapubic region, roughly 4 cm incisional scar noted to right lower quadrant, mild TTP to RLQ and around umbilicus, mild rigidity, no guarding, no rebound tenderness  Musculoskeletal: Normal range of motion.  Neurological: He is alert. Coordination normal.  Skin: Skin is warm and dry. He is not diaphoretic.  Psychiatric: He has a normal mood and affect. His behavior is normal.  Nursing note and vitals reviewed.    ED Treatments / Results  Labs (all labs ordered are listed, but only abnormal results are displayed) Labs Reviewed  COMPREHENSIVE METABOLIC PANEL - Abnormal; Notable for the following:       Result Value   Potassium 2.7 (*)    Glucose, Bld 124 (*)    Creatinine, Ser 1.25 (*)    Total Bilirubin 2.3 (*)    All other components within normal limits  CBC - Abnormal; Notable for the following:    WBC 15.5 (*)    All other components within normal limits  URINALYSIS, ROUTINE W REFLEX MICROSCOPIC (NOT AT [facility]) - Abnormal; Notable for the following:    Color, Urine AMBER  (*)    Bilirubin Urine SMALL (*)    Ketones, ur 40 (*)    All other components within normal limits  LIPASE, BLOOD  MAGNESIUM  I-STAT TROPOININ, ED    EKG  EKG Interpretation  Date/Time:  Thursday August 20 2016 13:41:07 EDT Ventricular Rate:  63 PR Interval:    QRS Duration: 104 QT Interval:  450 QTC Calculation: 461 R Axis:   -63 Text Interpretation:  Sinus rhythm Borderline prolonged PR interval LAD, consider left anterior fascicular block Nonspecific T abnrm, anterolateral leads When compared with ECG of 12/26/2013, 04/27/2014 No significant change was found Confirmed by [facility]  MD, Nunzio Cory [phone]) on 08/20/2016 1:56:22 PM       Radiology Ct Abdomen Pelvis W Contrast  Result Date: 08/20/2016 CLINICAL DATA:  Pt BIB EMS from home c/o abdominal pain and nausea; pt gagging in triage and spitting  into emesis bag; pt states he had abdominal surgery in 2003 and 2011 and has had chronic diarrhea ever since; surgeries included appendectomy EXAM: CT ABDOMEN AND PELVIS WITH CONTRAST TECHNIQUE: Multidetector CT imaging of the abdomen and pelvis was performed using the standard protocol following bolus administration of intravenous contrast. CONTRAST:  161mL ISOVUE-300 IOPAMIDOL (ISOVUE-300) INJECTION 61% COMPARISON:  CT 01/24/2014 FINDINGS: Lower chest: Bibasilar atelectasis.  No infiltrate. Hepatobiliary: Multiple low-density lesions in liver consistent with hepatic cysts. No biliary duct dilatation. Postcholecystectomy. Pancreas: No pancreatic inflammation.  No duct dilatation. Spleen: Normal spleen Adrenals/Urinary Tract: Adrenal glands are normal. Bilateral nonobstructing renal calculi. No ureterolithiasis. Normal bladder. Stomach/Bowel: Stomach, duodenum, small-bowel normal. Enteric colonic anastomosis in the RIGHT abdomen consistent with RIGHT hemicolectomy. Several diverticular the descending colon without acute inflammation. Vascular/Lymphatic: Abdominal aorta is normal caliber. There  is no retroperitoneal or periportal lymphadenopathy. No pelvic lymphadenopathy. Reproductive: Prostate gland is normal.  Seminal vesicles normal. Other: No inguinal hernia. Musculoskeletal: No aggressive osseous lesion. Small lucent lesion in the the T11 vertebral body is felt to relate to endplate degeneration. IMPRESSION: 1. No acute abdominal or pelvic findings. 2. Post RIGHT hemicolectomy without complicating features. 3. Multiple benign-appearing hepatic cysts.  Post cholecystectomy. 4. Nephrolithiasis without ureteral obstruction. Electronically Signed   By: Suzy Bouchard M.D.   On: 08/20/2016 09:17    Procedures Procedures (including critical care time)  Medications Ordered in ED Medications  0.9 % NaCl with KCl 20 mEq/ L  infusion ( Intravenous Stopped 08/20/16 1019)  ondansetron (ZOFRAN) injection 4 mg (4 mg Intravenous Given 08/20/16 0752)  iopamidol (ISOVUE-300) 61 % injection 100 mL (100 mLs Intravenous Contrast Given 08/20/16 0843)  0.9 % NaCl with KCl 20 mEq/ L  infusion ( Intravenous Stopped 08/20/16 1455)  potassium chloride SA (K-DUR,KLOR-CON) CR tablet 40 mEq (40 mEq Oral Given 08/20/16 1349)     Initial Impression / Assessment and Plan / ED Course  I have reviewed the triage vital signs and the nursing notes.  Pertinent labs & imaging results that were available during my care of the patient were reviewed by me and considered in my medical decision making (see chart for details).  Clinical Course   Pt with a history of SBO with N, V and decreased bowel movements. Hypokalemic treated with 27mEq IV and 21mEq PO potassium. Mg levels WNL. Non specific leukocytosis. Elevated total bilirubin with normal LFT's. Serum creatinine close to pt's baseline (1.20 04/23/2016 and 1.25 today)  Patient is nontoxic, nonseptic appearing, in no apparent distress.  Patient's pain and other symptoms adequately managed in emergency department.  Fluid bolus given.  Labs, imaging and vitals reviewed.  CT reviewed by me revealed no acute intraabdominal finding. CT did reveal nephrolithiasis without obstruction. UA without blood.  Patient does not meet the SIRS or Sepsis criteria.  On repeat exam patient does not have a surgical abdomin and there are no peritoneal signs.  No indication of appendicitis, bowel obstruction, bowel perforation, cholecystitis, diverticulitis. Pt's pain resolved while in the ED and he was able to tolerate PO. Abdominal pain not likely of cardiopulmonary etiology due to negative troponin and no acute changes on EKG.  Patient discharged home with symptomatic treatment and given strict instructions for follow-up with their primary care physician to have potassium rechecked in 2 days and GI f/u as needed.  I have also discussed reasons to return immediately to the ER.  Patient expresses understanding and agrees with plan.  Pt case discussed with Dr. Thurnell Garbe who agrees  with the above plan.  CRITICAL CARE Performed by: Kalman Drape   Total critical care time: 30 minutes  Critical care time was exclusive of separately billable procedures and treating other patients.  Critical care was necessary to treat or prevent imminent or life-threatening deterioration.  Critical care was time spent personally by me on the following activities: development of treatment plan with patient and/or surrogate as well as nursing, discussions with consultants, evaluation of patient's response to treatment, examination of patient, obtaining history from patient or surrogate, ordering and performing treatments and interventions, ordering and review of laboratory studies, ordering and review of radiographic studies, pulse oximetry and re-evaluation of patient's condition.   Final Clinical Impressions(s) / ED Diagnoses   Final diagnoses:  Abdominal pain, unspecified abdominal location  Non-intractable vomiting with nausea, vomiting of unspecified type    New Prescriptions New Prescriptions     No medications on file     Kalman Drape, [place] 08/20/16 [place], DO 08/22/16 2044

## 2016-08-20 NOTE — ED Notes (Signed)
Requested patient to urinate. 

## 2016-10-28 ENCOUNTER — Encounter: Payer: Self-pay | Admitting: Internal Medicine

## 2016-10-28 ENCOUNTER — Ambulatory Visit (INDEPENDENT_AMBULATORY_CARE_PROVIDER_SITE_OTHER): Payer: Medicaid Other | Admitting: Internal Medicine

## 2016-10-28 VITALS — BP 110/68 | HR 79 | Ht 63.0 in | Wt 152.2 lb

## 2016-10-28 DIAGNOSIS — I10 Essential (primary) hypertension: Secondary | ICD-10-CM | POA: Diagnosis not present

## 2016-10-28 DIAGNOSIS — Z5181 Encounter for therapeutic drug level monitoring: Secondary | ICD-10-CM

## 2016-10-28 DIAGNOSIS — R0789 Other chest pain: Secondary | ICD-10-CM

## 2016-10-28 DIAGNOSIS — I499 Cardiac arrhythmia, unspecified: Secondary | ICD-10-CM | POA: Diagnosis not present

## 2016-10-28 DIAGNOSIS — I498 Other specified cardiac arrhythmias: Secondary | ICD-10-CM

## 2016-10-28 DIAGNOSIS — Z79899 Other long term (current) drug therapy: Secondary | ICD-10-CM

## 2016-10-28 NOTE — Patient Instructions (Addendum)
Medication Instructions:  Your physician recommends that you continue on your current medications as directed. Please refer to the Current Medication list given to you today.   Labwork: None   Testing/Procedures: None   Follow-Up: Your physician wants you to follow-up in: 6 MONTHS WITH EXTENDER AND 12 MONTHS WITH DR [name]. You will receive a reminder letter in the mail two months in advance. If you don't receive a letter, please call our office to schedule the follow-up appointment.  Any Other Special Instructions Will Be Listed Below (If Applicable).     If you need a refill on your cardiac medications before your next appointment, please call your pharmacy.

## 2016-10-29 DIAGNOSIS — Z5181 Encounter for therapeutic drug level monitoring: Secondary | ICD-10-CM | POA: Insufficient documentation

## 2016-10-29 DIAGNOSIS — Z79899 Other long term (current) drug therapy: Secondary | ICD-10-CM

## 2016-10-29 NOTE — Progress Notes (Signed)
OFFICE NOTE  Chief Complaint:  No complaints  Primary Care Physician: Harvie Junior, MD  HPI:  Eduardo West is a pleasant 55 year old [name] he is male who is seen today with a Optometrist. He is referred by Dr. Jimmye Norman for evaluation of an abnormal heart rhythm. EKG today shows ventricular bigeminy with ST and T wave abnormalities laterally concerning for ischemia. Eduardo West reports some increasing chest discomfort with exertion as well as shortness of breath. He says that he typically walks 30-45 minutes almost every day but recently has had worsening shortness of breath and his fatigue after only 5-10 minutes. He says that his chest discomfort is worse when exerting himself and released somewhat by rest. There is no significant family history of heart disease per his report although hypertension is his family. He was recently hospitalized for small bowel obstruction and has improved. He has had partial colon resection as well as appendectomy in the past. In January 2015 while hospitalized he was seen for chest pain by Dr. Pernell Dupre. Echocardiogram was recommended which showed an EF of 60-65% with mild diastolic dysfunction and moderate concentric LVH. There were no segmental wall motion abnormalities. He reports his symptoms have progressively gotten worse since his recent hospitalization.  Eduardo West returns for follow-up. At his last office visit I started him on low-dose beta blocker and he remains in ventricular bigeminy today. The electrical rate is 68 however his pulse rate is half of that at 34. He cannot express whether he has more fatigue or chest discomfort on the medicine than he had before. He certainly does not feel in any improvement on the beta blocker. Pulse rate may be slower.  I had the pleasure seeing Eduardo West back in the office today. In the interim he seen Dr. Cristopher Peru twice, who started him on flecainide and increased dose to 100 mg twice a day. This seems to  have been effective in suppressing his PVCs. He does feel somewhat better and has more energy and is less short of breath.  I saw Eduardo West back in the office today with an interpreter. Overall he is doing fairly well. He is currently on flecainide 100 mg twice a day. He reports nearly complete resolution of his palpitations. His EKG shows no evidence of bigeminy. He says he gets occasional sharp chest pain but no significant symptoms that sound anginal. He also has some mild shortness of breath. He underwent a stress test last year which was negative for ischemia. Blood pressure appears well controlled on Dyazide.  10/28/2016  Eduardo West returns today for follow-up. He is accompanied by a [place] interpreter. Overall he feels well. He denies any significant palpitations. He has been on flecainide 100 mg twice a day. QTc interval is 467 ms today with EKG demonstrating sinus rhythm and first-degree AV block at 212 ms. He recently was in the hospital for abdominal pain and was given some hydrocodone with improvement. The etiology was unclear. He's not had any significant palpitations. He denies any chest pain. He had a stress test in April of this year which was nonischemic. He will need another stress test in 2 years for monitoring of flecainide therapy.  PMHx:  Past Medical History:  Diagnosis Date  . Abdominal pain   . Buzzing in ear   . Chronic diarrhea 03/04/2012  . Colon polyp   . Diarrhea   . Diverticulitis of cecum 12/2009   Resected 2011:  COLON, SEGMENTAL RESECTION, RIGHT AND TERMINAL  ILEUM : - SEGMENT OF COLON  . Hypertension   . Kidney stones   . Pleural effusion, left   . 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 Right leg numbness     Past Surgical History:  Procedure Laterality Date  . APPENDECTOMY  2003   Dr. Marlou Starks:  Attempted laparoscopic and subsequent open appendectomy  . CHOLECYSTECTOMY  12/2009   Dr. Excell Seltzer  . COLON SURGERY    . FLEXIBLE SIGMOIDOSCOPY N/A 09/13/2013   Procedure:  FLEXIBLE SIGMOIDOSCOPY;  Surgeon: Leighton Ruff, MD;  Location: [facility];  Service: Endoscopy;  Laterality: N/A;  . RIGHT COLECTOMY  12/2009   Dr Excell Seltzer: open resection for cecal diverticulitis    FAMHx:  Family History  Problem Relation Age of Onset  . Cancer Mother     SOCHx:   reports that he quit smoking about 8 years ago. He has a 10.00 pack-year smoking history. He has never used smokeless tobacco. He reports that he does not drink alcohol or use drugs.  ALLERGIES:  No Known Allergies  ROS: A comprehensive review of systems was negative.  HOME MEDS: Current Outpatient Prescriptions  Medication Sig Dispense Refill  . flecainide (TAMBOCOR) 100 MG tablet Take 1 tablet (100 mg total) by mouth 2 (two) times daily. 180 tablet 1  . lisinopril (PRINIVIL,ZESTRIL) 20 MG tablet Take 1 tablet (20 mg total) by mouth daily. 90 tablet 3  . tamsulosin (FLOMAX) 0.4 MG CAPS capsule Take 1 capsule by mouth daily.  11   No current facility-administered medications for this visit.     LABS/IMAGING: No results found for this or any previous visit (from the past 48 hour(s)). No results found.  VITALS: BP 110/68   Pulse 79   Ht 5\' 3"  (1.6 m)   Wt 152 lb 3.2 oz (69 kg)   BMI 26.96 kg/m   EXAM: General appearance: alert and no distress Neck: no carotid bruit and no JVD Lungs: clear to auscultation bilaterally Heart: regular rate and rhythm, S1, S2 normal, no murmur, click, rub or gallop Abdomen: soft, non-tender; bowel sounds normal; no masses,  no organomegaly Extremities: extremities normal, atraumatic, no cyanosis or edema Pulses: 2+ and symmetric Skin: Skin color, texture, turgor normal. No rashes or lesions Neurologic: Grossly normal Psych: Pleasant  EKG: Sinus rhythm with first-degree AV block at 79, QTc 467 ms  ASSESSMENT: 1. Ventricular bigeminy with nonconducted PVCs - suppressed on flecainide 2. Chest pain with exertion - low risk nuclear stress test  (2017) 3. Dyspnea on exertion - improved 4. Hypertension  PLAN: 1.   Eduardo West has had suppression of ventricular bigeminy with flecainide. He had a low risk stress test this year. Blood pressure is at goal. His shortness of breath is improved. He occasionally gets some mild chest discomfort which is noncardiac. QTC is stable on flecainide. Plan to see him back in 6 months with an extender and one year with me (for monitoring of antiarrhythmic therapy).  Pixie Casino, MD, [facility] Attending Cardiologist [name] 10/29/2016, 5:55 PM

## 2016-10-30 ENCOUNTER — Ambulatory Visit (INDEPENDENT_AMBULATORY_CARE_PROVIDER_SITE_OTHER): Payer: Medicaid Other | Admitting: Internal Medicine

## 2016-10-30 ENCOUNTER — Encounter: Payer: Self-pay | Admitting: Internal Medicine

## 2016-10-30 VITALS — BP 120/82 | HR 75 | Ht 63.0 in | Wt 150.6 lb

## 2016-10-30 DIAGNOSIS — R0600 Dyspnea, unspecified: Secondary | ICD-10-CM

## 2016-10-30 DIAGNOSIS — Z758 Other problems related to medical facilities and other health care: Secondary | ICD-10-CM | POA: Insufficient documentation

## 2016-10-30 DIAGNOSIS — Z789 Other specified health status: Secondary | ICD-10-CM | POA: Diagnosis not present

## 2016-10-30 NOTE — Progress Notes (Signed)
Subjective:    Patient ID: Eduardo West, male    DOB: [birth year], 55 y.o.   MRN: [id number]  HPI     Primary [facility] [address]  55 y.o. [place] male quit smoking 2010 extremely difficult hx due to language barrier, even thru fm member serving as interpreter   June 06, 2010 1st pulmonary office eval for large L effusion and co fatigue and sob x months. eval by Eduardo West with abd ct showing large effusion. minimal L chest discomfort, wt loss ? amt. no cough or leg swelling. no fever or sweats rec tap - 600 cc serous yellow exudate 94% L> mature lymphocytes, rec flow cytometry if recurs.   July 24, 2010 Followup on thoracentesis results. Pt states still has some chest discomfort and tightness- worse with inspiration. He denies SOB or cough. Appetite is poor. cxr no real change, rec return in 3 weeks   August 22, 2010 3 wk followup with cxr. Pt c/o increased SOB- esp at in the early evening. Pt states that he is SOB without any exertion at all assoc with centralized cp with coughing only. effusion did not worsen vs the pots tap previous xrary therefore rec Omeprazole 20 mg Take one 30-60 min before first and last meals of the day  GERD diet   September 11, 2010 cc Dyspnea- the same, no better or worse. not losing any weight. no cough now. afraid to eat but not loosing any wt. rec Continue omeprazole 20mg  Take one 30-60 min before first and last meals of the day   October 09, 2010 ov cp all the time but worse with deep breath and sob, not eating. rec conservative f/u   November 05, 2010 ov no change doe/ cp on ppi. December 08, 2010 --Presents for an acute office visit.Complains of SOB especially at night, some heaviness in chest, occ dry cough. Comes and goes. Was seen by PCP last week and sent for xray which showed no change in left effusion. He cont to feel bad w/no energy.Cough persists w/ intermittent fevers, pain in left rib area. Interpreter from Eduardo West. rec IPPD     December 10, 2010 seen by Dr Eduardo West  PPD   positive. Has chronic L effusion. No family exposure. Came to [place] 1992. Was pos on PPD in the past took 8months INH, 1992  Now with chronic effusion. Pt cont to have dry cough and weakness. Pt has intermittent fever. Pt cont to feel very bad.  2-3 months ago had fever and sweating. > health dept eval   December 22, 2010 ov no apparent change in any symptoms x tired from being on tb rx per Health dept. no convincing NS, wt loss, no change on serial cxrs since his orginal tap. rec f/u per HD and return here when released    05/06/2011 ov/Eduardo West discharged by Health dept and  no change at all in chronic chest and abd pain and doe.  rec no change rx, f/u q 3 months until gets primary doctor to follow.       02/04/2012 f/u ov/Eduardo West cc new onset bilateral mid back pain positional, better supine first noted over a year ago. Breathing ok and no cough. Overall pain pattern waxes and wanes but at present is no where near as severe as before. No limiting sob. Never found anything that makes it better. rec rx as IBS, no pulmonary f/u needed.    04/04/2012 f/u ov/Eduardo West cc no better bilateral positional  mid back pain, no assoc cough or sob. Not clear he tried any of the measures rec to date nor has a primary doctor. rec Please remember to go to the x-ray > no change  Return to Dr Eduardo West, your primary for further evaluation of your back pain   08/23/2012 f/u ov/Eduardo West cc "no better" cc feeling tired, doe x 50 ft  rec F/u prn    12/05/2014 f/u ov/Eduardo West re: continues with same chronic complaints ? Worse x 2 m Chief Complaint  Patient presents with  . Acute Visit    Pt c/o fatigue and SOB for the past 2-3 months. He c/o nasal congestion at night.        No obvious day to day or daytime variabilty or assoc chronic cough or cp or chest tightness, subjective wheeze overt sinus or hb symptoms. No unusual exp hx or h/o childhood pna/ asthma or knowledge of premature  birth.  Sleeping ok without nocturnal  or early am exacerbation  of respiratory  c/o's or need for noct saba. Also denies any obvious fluctuation of symptoms with weather or environmental changes or other aggravating or alleviating factors except as outlined above  IOV 10/30/2016  Chief Complaint  Patient presents with  . Pulmonary Consult    Pt. is here to follow up to see if he still has fuild in his lungs, Pt. did have a procedure where he had fuild removed from his lungs. Pt. has some trouble breathing mainly at night, Pt. denies chest pain,coughing    55 year old [place] male. He does not speak any [place]. History is gained from interpreter Eduardo West via [employer] . She came on web based service. Patient is an extremely poor historian. As best as I can gather he is had insidious onset of dyspnea for a year. It appears to be a recurrence. It is progressive. It is exertional and episodic and random. There is no associated orthopnea. There is no associated cough with a might be intermittent wheezing. This no clear cut aggravating or relieving factors. It is progressive and moderate severity. He feels that his pleural effusion is back and he is insisting on having a CT scan. And if the effusion is back he wants to have a thoracentesis. Apparently the physician did this from a few years ago and he felt better. 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   reports that he quit smoking about 8 years ago. He has a 10.00 pack-year smoking history. He has never used smokeless tobacco.  Past Surgical History:  Procedure Laterality Date  . APPENDECTOMY  2003   Dr. Marlou West:  Attempted laparoscopic and subsequent open appendectomy  . CHOLECYSTECTOMY  12/2009   Dr.  Excell West  . COLON SURGERY    . FLEXIBLE SIGMOIDOSCOPY N/A 09/13/2013   Procedure: FLEXIBLE SIGMOIDOSCOPY;  Surgeon: Leighton Ruff, MD;  Location: [facility];  Service: Endoscopy;  Laterality: N/A;  . RIGHT COLECTOMY  12/2009   Dr Eduardo West: open resection for cecal diverticulitis    No Known Allergies  There is no immunization history for the selected administration types on file for this patient.  Family History  Problem Relation Age of Onset  . Cancer Mother      Current Outpatient Prescriptions:  .  flecainide (TAMBOCOR) 100 MG tablet, Take  1 tablet (100 mg total) by mouth 2 (two) times daily., Disp: 180 tablet, Rfl: 1 .  lisinopril (PRINIVIL,ZESTRIL) 20 MG tablet, Take 1 tablet (20 mg total) by mouth daily., Disp: 90 tablet, Rfl: 3 .  tamsulosin (FLOMAX) 0.4 MG CAPS capsule, Take 1 capsule by mouth daily., Disp: , Rfl: 11   Review of Systems  Constitutional: Negative.  Negative for fever and unexpected weight change.  HENT: Positive for congestion. Negative for dental problem, ear pain, nosebleeds, postnasal drip, rhinorrhea, sinus pressure, sneezing, sore throat and trouble swallowing.   Eyes: Negative for redness and itching.  Respiratory: Positive for shortness of breath. Negative for cough, chest tightness and wheezing.   Cardiovascular: Negative.  Negative for palpitations and leg swelling.  Gastrointestinal: Negative.  Negative for nausea and vomiting.  Endocrine: Negative.   Genitourinary: Negative.  Negative for dysuria.  Musculoskeletal: Negative.  Negative for joint swelling.  Skin: Negative.  Negative for rash.  Allergic/Immunologic: Negative.   Neurological: Positive for dizziness. Negative for headaches.  Hematological: Negative.  Does not bruise/bleed easily.  Psychiatric/Behavioral: Negative.  Negative for dysphoric mood. The patient is not nervous/anxious.        Objective:   Physical Exam  Constitutional: He is oriented to person, place, and time. He  appears well-developed and well-nourished. No distress.  HENT:  Head: Normocephalic and atraumatic.  Right Ear: External ear normal.  Left Ear: External ear normal.  Mouth/Throat: Oropharynx is clear and moist. No oropharyngeal exudate.  Eyes: Conjunctivae and EOM are normal. Pupils are equal, round, and reactive to light. Right eye exhibits no discharge. Left eye exhibits no discharge. No scleral icterus.  Neck: Normal range of motion. Neck supple. No JVD present. No tracheal deviation present. No thyromegaly present.  Cardiovascular: Normal rate, regular rhythm and intact distal pulses.  Exam reveals no gallop and no friction rub.   No murmur heard. Pulmonary/Chest: Effort normal and breath sounds normal. No respiratory distress. He has no wheezes. He has no rales. He exhibits no tenderness.  Abdominal: Soft. Bowel sounds are normal. He exhibits no distension and no mass. There is no tenderness. There is no rebound and no guarding.  verticall abd scar from prior surgery of colitis  Musculoskeletal: Normal range of motion. He exhibits no edema or tenderness.  Lymphadenopathy:    He has no cervical adenopathy.  Neurological: He is alert and oriented to person, place, and time. He has normal reflexes. No cranial nerve deficit. Coordination normal.  Skin: Skin is warm and dry. No rash noted. He is not diaphoretic. No erythema. No pallor.  Psychiatric: He has a normal mood and affect. His behavior is normal. Judgment and thought content normal.  Nursing note and vitals reviewed.   Vitals:   10/30/16 1056  BP: 120/82  Pulse: 75  SpO2: 98%  Weight: 150 lb 9.6 oz (68.3 kg)  Height: 5\' 3"  (1.6 m)   Estimated body mass index is 26.68 kg/m as calculated from the following:   Height as of this encounter: 5\' 3"  (1.6 m).   Weight as of this encounter: 150 lb 9.6 oz (68.3 kg).        Assessment & Plan:     ICD-9-CM ICD-10-CM   1. Dyspnea, unspecified type 786.09 R06.00 CT CHEST HIGH  RESOLUTION  2. Language barrier V49.89 Z78.9    He has unspecified dyspnea. There is very difficult to undergo pulmonary function test given his language barrier. Therefore it is best approach this with a high-resolution CT chest.  This can put to rest many etiologies. If this is negative we will try to do an exhaled nitric oxide/subject him to pulmonary stress test. He will return for follow-up after the CT chest.   > 50% of this > 25 min visit spent in face to face counseling or coordination of care    Dr. Brand Males, M.D., [facility].C.P Pulmonary and Critical Care Medicine Staff Physician [place] [facility] Critical [facility] Pager: [phone], If no answer or between  15:00h - 7:00h: call [phone]  10/30/2016 11:18 AM

## 2016-10-30 NOTE — Patient Instructions (Signed)
ICD-9-CM ICD-10-CM   1. Dyspnea, unspecified type 786.09 R06.00   2. Language barrier V49.89 Z78.9     Do HRCT chest - return after above to see APP or Dr Chase Caller If HRCT negative do FenO  Need [employer] - [place] interpreter

## 2016-11-02 ENCOUNTER — Inpatient Hospital Stay: Admission: RE | Admit: 2016-11-02 | Payer: Medicaid Other | Source: Ambulatory Visit

## 2016-11-04 ENCOUNTER — Telehealth: Payer: Self-pay | Admitting: Internal Medicine

## 2016-11-04 ENCOUNTER — Encounter: Payer: Self-pay | Admitting: Adult Health

## 2016-11-04 NOTE — Telephone Encounter (Signed)
Per [facility]: evicor and medicaid denied this pt's chest ct you can call [phone] and case# is [id number] to do peer to peer pt is scheduled for Friday 11/06/16     MR please advise. Thanks.

## 2016-11-06 ENCOUNTER — Inpatient Hospital Stay: Admission: RE | Admit: 2016-11-06 | Payer: Medicaid Other | Source: Ambulatory Visit

## 2016-11-11 ENCOUNTER — Ambulatory Visit: Payer: Medicaid Other | Admitting: Adult Health

## 2016-11-13 NOTE — Telephone Encounter (Signed)
MR please advise. Pt's CT is scheduled for 12.22.17. Thanks.

## 2016-11-16 ENCOUNTER — Telehealth: Payer: Self-pay | Admitting: Internal Medicine

## 2016-11-17 ENCOUNTER — Inpatient Hospital Stay: Admission: RE | Admit: 2016-11-17 | Payer: Medicaid Other | Source: Ambulatory Visit

## 2016-11-17 NOTE — Telephone Encounter (Signed)
lmtcb for [employer].

## 2016-11-17 NOTE — Telephone Encounter (Signed)
Please advise if Peer-to-Peer needs to be done or if CT needs to be cancelled.   Reminder sent back to MR of peer-to-peer.

## 2016-11-17 NOTE — Telephone Encounter (Signed)
New case # is [id number]. After spneding long time waiting and waiting I had to go intubate someone in ICU. This will have to wait till I get time again  Dr. Brand Males, M.D., [facility].C.P Pulmonary and Critical Care Medicine Staff Physician [place] [facility] and Critical [facility] Pager: [phone], If no answer or between  15:00h - 7:00h: call [phone]  11/17/2016 4:30 PM

## 2016-11-17 NOTE — Telephone Encounter (Signed)
Eduardo West has returned call. Eduardo West states they have already cancelled the CT chest and to update them when hear back from MR. Please refer to phone note from 12.6.17. Will sign off.

## 2016-11-18 ENCOUNTER — Telehealth: Payer: Self-pay | Admitting: Adult Health

## 2016-11-18 NOTE — Telephone Encounter (Signed)
Spoke with Vaughan Basta from radiology, who states pt's CT has been canceled that was scheduled for 11/17/16 due to CT needing a peer to peer. MR attempted to do this peer to peer and was placed on a long hold. appointment for 11/20/16 with TP as been canceled until after CT, as this apt was to go over CT results. Lm to make pt aware of this. Will await call back.

## 2016-11-20 ENCOUNTER — Ambulatory Visit: Payer: Medicaid Other | Admitting: Adult Health

## 2016-11-24 NOTE — Telephone Encounter (Signed)
lmtcb x2 for pt. 

## 2016-11-26 NOTE — Telephone Encounter (Signed)
MR please advise if the peer to peer has been done for the CT to be scheduled.  thanks

## 2016-11-26 NOTE — Telephone Encounter (Signed)
Waiting on dr Chase Caller to do the peer to peer he had [place] cancel it for now [place]

## 2016-11-26 NOTE — Telephone Encounter (Signed)
PCP are waiting for MR to do the peer to peer.  Will forward back to MR to complete this. MR please advise once this has been completed.  thanks

## 2016-12-04 NOTE — Telephone Encounter (Signed)
MR please advise if this peer to peer has been completed. Thanks.

## 2016-12-08 NOTE — Telephone Encounter (Signed)
MR please advise. Thanks! 

## 2016-12-11 NOTE — Telephone Encounter (Signed)
MR please advise. Thanks! 

## 2016-12-15 NOTE — Telephone Encounter (Signed)
MR please advise on the status of this message. thanks

## 2016-12-18 NOTE — Telephone Encounter (Signed)
Eduardo Dan, do you know if MR plans to do peer to peer for the pt's ct?  Please advise thanks

## 2016-12-21 NOTE — Telephone Encounter (Signed)
I do not, I have not spoken to him regarding this.   MR Please advise if you are going to do this peer to peer. Thanks.

## 2016-12-24 NOTE — Telephone Encounter (Addendum)
Eduardo West  Please have [facility] get me  [place] number. I will be able to tacke it  12/25/16 . Also get CXR 2 view for dyspnea. Often times medicaid wants that first before approving ct  Thanks  Dr. Brand Males, M.D., [facility].C.P Pulmonary and Critical Care Medicine Staff Physician [place] [facility] and Critical [facility] Pager: [phone], If no answer or between  15:00h - 7:00h: call [phone]  12/24/2016 2:45 PM

## 2016-12-28 NOTE — Telephone Encounter (Signed)
lmtcb for pt.   Libby please advise on number. Thanks.

## 2016-12-29 NOTE — Telephone Encounter (Signed)
[facility], is the peer to peer not needed at this time since we are starting over?

## 2016-12-29 NOTE — Telephone Encounter (Signed)
Eduardo West this was I month ago the appt was 11/17/16 but was cancelled it has not been rescheduled are we starting over with ct being scheduled again and then precert it again?

## 2016-12-30 NOTE — Telephone Encounter (Signed)
Will try to re-precert this for chest ct Eduardo West

## 2016-12-31 NOTE — Telephone Encounter (Signed)
MR please advise. Thanks! 

## 2016-12-31 NOTE — Telephone Encounter (Signed)
Will forward to MR as FYI 

## 2016-12-31 NOTE — Telephone Encounter (Signed)
Well I dont know what I done that was different but I got it authorized so we are calling the daughter to reschedule it Eduardo West

## 2017-01-13 ENCOUNTER — Ambulatory Visit (INDEPENDENT_AMBULATORY_CARE_PROVIDER_SITE_OTHER)
Admission: RE | Admit: 2017-01-13 | Discharge: 2017-01-13 | Disposition: A | Discharge status: Home / Self Care | Payer: Medicaid Other | Source: Ambulatory Visit | Attending: Internal Medicine | Admitting: Internal Medicine

## 2017-01-13 DIAGNOSIS — R0600 Dyspnea, unspecified: Secondary | ICD-10-CM | POA: Diagnosis not present

## 2017-01-15 ENCOUNTER — Telehealth: Payer: Self-pay | Admitting: Internal Medicine

## 2017-01-15 NOTE — Telephone Encounter (Signed)
MR, pt had CT chest on 01/13/17 as seen in the chart under the 'imaging' tab.

## 2017-01-15 NOTE — Telephone Encounter (Signed)
  pls ignore other messag  I sent you   CT fairly clear Has co art calcificaion but he has had stress test already Has renal stone  Plan  - bring him back in first avail and I will do feno and see if I can ihave him do cpst  Thanks  Dr. Brand Males, M.D., [facility].C.P Pulmonary and Critical Care Medicine Staff Physician [place] [facility] Pager: [phone], If no answer or between  15:00h - 7:00h: call [phone]  01/15/2017 2:41 AM     Ct Chest High Resolution  Result Date: 01/13/2017 CLINICAL DATA:  Shortness of breath, shortness of breath on exertion. EXAM: CT CHEST WITHOUT CONTRAST TECHNIQUE: Multidetector CT imaging of the chest was performed following the standard protocol without intravenous contrast. High resolution imaging of the lungs, as well as inspiratory and expiratory imaging, was performed. COMPARISON:  CT abdomen pelvis 08/20/2016. FINDINGS: Cardiovascular: Atherosclerotic calcification of the arterial vasculature. Heart size normal. No pericardial effusion. Mediastinum/Nodes: No pathologically enlarged mediastinal or axillary lymph nodes. Hilar regions are difficult to evaluate without IV contrast. Esophagus is grossly unremarkable. Lungs/Pleura: Minimal biapical pleuroparenchymal scarring. Additional pleural-parenchymal scarring at the base of the left hemithorax. No subpleural reticulation, traction bronchiectasis/bronchiolectasis, ground-glass or architectural distortion. Expiratory phase imaging is not in true expiration, limiting evaluation. No pleural fluid. Airway is unremarkable. Upper Abdomen: Low-attenuation lesions in the liver measure up to 3.2 cm and are likely cysts although definitive characterization is limited due to size and/or lack of post-contrast imaging. Cholecystectomy. Visualized portions of the adrenal glands and right kidney are unremarkable. Stones are seen in the left kidney. Visualized portions of the  spleen, pancreas, stomach and bowel are grossly unremarkable. Musculoskeletal: No worrisome lytic or sclerotic lesions. Mild degenerative changes are seen in the spine. IMPRESSION: 1. No evidence of interstitial lung disease. Biapical and left basilar pleural parenchymal scarring. 2.  Aortic atherosclerosis (ICD10-170.0). 3. Left renal stones. Electronically Signed   By: Lorin Picket M.D.   On: 01/13/2017 12:39

## 2017-01-15 NOTE — Telephone Encounter (Signed)
Daneil Dan, I do not see that he has had his CT  Thanks  Dr. Brand Males, M.D., [facility].C.P Pulmonary and Critical Care Medicine Staff Physician [place] [facility] Pager: [phone], If no answer or between  15:00h - 7:00h: call [phone]  01/15/2017 2:11 AM

## 2017-01-18 ENCOUNTER — Other Ambulatory Visit: Payer: Self-pay | Admitting: Internal Medicine

## 2017-01-18 DIAGNOSIS — R0600 Dyspnea, unspecified: Secondary | ICD-10-CM

## 2017-01-18 NOTE — Telephone Encounter (Signed)
An order was entered for the feno. Nothing further is needed.

## 2017-01-18 NOTE — Telephone Encounter (Signed)
Spoke with patient and informed him of results. Pt asked for clinic location and was given address. A ROV was scheduled for 3/23 at 945. [name] Kitchen Nothing further is needed.

## 2017-01-18 NOTE — Telephone Encounter (Signed)
  MR please advise if we may close this message.

## 2017-02-03 NOTE — Telephone Encounter (Signed)
This message has been open since 10/2016.  I will close this message as of today and forward it back to MR if anything else is needed.

## 2017-02-03 NOTE — Telephone Encounter (Signed)
Seeing him 02/19/17; will discuss likely cards refrreal at fu

## 2017-02-19 ENCOUNTER — Ambulatory Visit (INDEPENDENT_AMBULATORY_CARE_PROVIDER_SITE_OTHER): Payer: Medicaid Other | Admitting: Internal Medicine

## 2017-02-19 ENCOUNTER — Encounter: Payer: Self-pay | Admitting: Internal Medicine

## 2017-02-19 DIAGNOSIS — R0600 Dyspnea, unspecified: Secondary | ICD-10-CM | POA: Diagnosis not present

## 2017-02-19 DIAGNOSIS — J328 Other chronic sinusitis: Secondary | ICD-10-CM

## 2017-02-19 DIAGNOSIS — J329 Chronic sinusitis, unspecified: Secondary | ICD-10-CM | POA: Insufficient documentation

## 2017-02-19 MED ORDER — PREDNISONE 10 MG PO TABS: ORAL_TABLET | ORAL | 0 refills | Status: DC

## 2017-02-19 MED ORDER — DOXYCYCLINE HYCLATE 100 MG PO TABS: ORAL_TABLET | ORAL | 0 refills | Status: DC

## 2017-02-19 NOTE — Assessment & Plan Note (Addendum)
Glad this is better Will address at followup when interpreter avaialbe and sinus issues taken care of You might need pulmonary stress test Address dc ace inhibitor at followup

## 2017-02-19 NOTE — Patient Instructions (Signed)
Sinusitis, chronic Take prednisone 40 mg daily x 2 days, then 20mg  daily x 2 days, then 10mg  daily x 2 days, then 5mg  daily x 2 days and stop  Take doxycycline 100mg  po twice daily x 7 days; take after meals and avoid sunlight   Do CT scan sinus wo contrast - will call with result  Dyspnea Glad this is better Will address at followup when interpreter avaialbe and sinus issues taken care of You might need pulmonary stress test   Followup 2-3 months with APP

## 2017-02-19 NOTE — Progress Notes (Signed)
Subjective:     Patient ID: Eduardo West, male   DOB: [birth year], 56 y.o.   MRN: [id number]  HPI    [facility] [address]  56 yo [place] male quit smoking 2010 extremely difficult hx due to language barrier, even thru fm member serving as interpreter   June 06, 2010 1st pulmonary office eval for large L effusion and co fatigue and sob x months. eval by [facility] with abd ct showing large effusion. minimal L chest discomfort, wt loss ? amt. no cough or leg swelling. no fever or sweats rec tap - 600 cc serous yellow exudate 94% L> mature lymphocytes, rec flow cytometry if recurs.   July 24, 2010 Followup on thoracentesis results. Pt states still has some chest discomfort and tightness- worse with inspiration. He denies SOB or cough. Appetite is poor. cxr no real change, rec return in 3 weeks   August 22, 2010 3 wk followup with cxr. Pt c/o increased SOB- esp at in the early evening. Pt states that he is SOB without any exertion at all assoc with centralized cp with coughing only. effusion did not worsen vs the pots tap previous xrary therefore rec Omeprazole 20 mg Take one 30-60 min before first and last meals of the day  GERD diet   September 11, 2010 cc Dyspnea- the same, no better or worse. not losing any weight. no cough now. afraid to eat but not loosing any wt. rec Continue omeprazole 20mg  Take one 30-60 min before first and last meals of the day   October 09, 2010 ov cp all the time but worse with deep breath and sob, not eating. rec conservative f/u   November 05, 2010 ov no change doe/ cp on ppi. December 08, 2010 --Presents for an acute office visit.Complains of SOB especially at night, some heaviness in chest, occ dry cough. Comes and goes. Was seen by PCP last week and sent for xray which showed no change in left effusion. He cont to feel bad w/no energy.Cough persists w/ intermittent fevers, pain in left rib area. Interpreter from [place]. 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 No family exposure. Came to [place] 1992. Was pos on PPD in the past took 103months INH, 1992  Now with chronic effusion. Pt cont to have dry cough and weakness. Pt has intermittent fever. Pt cont to feel very bad.  2-3 months ago had fever and sweating. > health dept eval   December 22, 2010 ov no apparent change in any symptoms x tired from being on tb rx per Health dept. no convincing NS, wt loss, no change on serial cxrs since his orginal tap. rec f/u per HD and return here when released    05/06/2011 ov/[name] discharged by Health dept and  no change at all in chronic chest and abd pain and doe.  rec no change rx, f/u q 3 months until gets primary doctor to follow.       02/04/2012 f/u ov/[name] cc new onset bilateral mid back pain positional, better supine first noted over a year ago. Breathing ok and no cough. Overall pain pattern waxes and wanes but at present is no where near as severe as before. No limiting sob. Never found anything that makes it better. rec rx as IBS, no pulmonary f/u needed.    04/04/2012 f/u ov/[name] cc no better bilateral positional mid back pain,  no assoc cough or sob. Not clear he tried any of the measures rec to date nor has a primary doctor. rec Please remember to go to the x-ray > no change  Return to Dr Jimmye Norman, your primary for further evaluation of your back pain   08/23/2012 f/u ov/[name] cc "no better" cc feeling tired, doe x 50 ft  rec F/u prn    12/05/2014 f/u ov/[name] re: continues with same chronic complaints ? Worse x 2 m Chief Complaint  Patient presents with  . Acute Visit    Pt c/o fatigue and SOB for the past 2-3 months. He c/o nasal congestion at night.        No obvious day to day or daytime variabilty or assoc chronic cough or cp or chest tightness, subjective wheeze overt sinus or hb symptoms. No unusual exp hx or h/o childhood pna/ asthma or knowledge of premature  birth.  Sleeping ok without nocturnal  or early am exacerbation  of respiratory  c/o's or need for noct saba. Also denies any obvious fluctuation of symptoms with weather or environmental changes or other aggravating or alleviating factors except as outlined above  IOV 10/30/2016  Chief Complaint  Patient presents with  . Pulmonary Consult    Pt. is here to follow up to see if he still has fuild in his lungs, Pt. did have a procedure where he had fuild removed from his lungs. Pt. has some trouble breathing mainly at night, Pt. denies chest pain,coughing    56 year old [place] male. He does not speak any [place]. History is gained from interpreter [name] via [employer] . She came on web based service. Patient is an extremely poor historian. As best as I can gather he is had insidious onset of dyspnea for a year. It appears to be a recurrence. It is progressive. It is exertional and episodic and random. There is no associated orthopnea. There is no associated cough with a might be intermittent wheezing. This no clear cut aggravating or relieving factors. It is progressive and moderate severity. He feels that his pleural effusion is back and he is insisting on having a CT scan. And if the effusion is back he wants to have a thoracentesis. Apparently the physician did this from a few years ago and he felt better. 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   reports that he quit smoking about 8 years ago. He has a 10.00 pack-year smoking history. He has never used smokeless tobacco.   OV 02/19/2017  Chief Complaint  Patient presents with  . Follow-up    Pt here after CT chest. Pt states his breathing is doinb better, Pt c/o sinus congestion x week, yellow  sinus mucus. Pt also c/o intermittent chest pain, midsternal.      Eduardo West prsents for followup of dyspnea. In inteim he finally had his sCT chest after insurance delays. No ILD. No emphysema. No tumor. Today the STRATUS machine died for interpretation and his english is actually undestandable. He tells me that the dyspnea is better. However he has 8 weeks of chronic sinusitis,  nasal blockage, runny nose and cough he has been treated with local inhalers but not with antibiotics and steroids according to his history.   EXAM: CT CHEST WITHOUT CONTRAST  TECHNIQUE: Multidetector CT imaging of the chest was performed following the standard protocol without intravenous contrast. High resolution imaging of the lungs, as well as inspiratory and expiratory imaging, was performed.  COMPARISON:  CT abdomen pelvis 08/20/2016.  FINDINGS: Cardiovascular: Atherosclerotic calcification of the arterial vasculature. Heart size normal. No pericardial effusion.  Mediastinum/Nodes: No pathologically enlarged mediastinal or axillary lymph nodes. Hilar regions are difficult to evaluate without IV contrast. Esophagus is grossly unremarkable.  Lungs/Pleura: Minimal biapical pleuroparenchymal scarring. Additional pleural-parenchymal scarring at the base of the left hemithorax. No subpleural reticulation, traction bronchiectasis/bronchiolectasis, ground-glass or architectural distortion. Expiratory phase imaging is not in true expiration, limiting evaluation. No pleural fluid. Airway is unremarkable.  Upper Abdomen: Low-attenuation lesions in the liver measure up to 3.2 cm and are likely cysts although definitive characterization is limited due to size and/or lack of post-contrast imaging. Cholecystectomy. Visualized portions of the adrenal glands and right kidney are unremarkable. Stones are seen in the left kidney. Visualized portions of the spleen, pancreas, stomach and bowel are grossly  unremarkable.  Musculoskeletal: No worrisome lytic or sclerotic lesions. Mild degenerative changes are seen in the spine.  IMPRESSION: 1. No evidence of interstitial lung disease. Biapical and left basilar pleural parenchymal scarring. 2.  Aortic atherosclerosis (ICD10-170.0). 3. Left renal stones.   Electronically Signed   By: Lorin Picket M.D.   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   reports that he quit smoking about 9 years ago. He has a 10.00 pack-year smoking history. He has never used smokeless tobacco.  Past Surgical History:  Procedure Laterality Date  . APPENDECTOMY  2003   Dr. Marlou Starks:  Attempted laparoscopic and subsequent open appendectomy  . CHOLECYSTECTOMY  12/2009   Dr. Excell Seltzer  . COLON SURGERY    . FLEXIBLE SIGMOIDOSCOPY N/A 09/13/2013   Procedure: FLEXIBLE SIGMOIDOSCOPY;  Surgeon: Leighton Ruff, MD;  Location: [facility];  Service: Endoscopy;  Laterality: N/A;  . RIGHT COLECTOMY  12/2009   Dr Excell Seltzer: open resection for cecal diverticulitis    No Known Allergies  Immunization History  Administered Date(s) Administered  . Influenza,inj,Quad PF,36+ Mos 09/30/2016    Family History  Problem Relation Age of Onset  . Cancer Mother      Current Outpatient Prescriptions:  .  flecainide (TAMBOCOR) 100 MG tablet, Take 1 tablet (100 mg total) by mouth 2 (two) times daily., Disp: 180 tablet, Rfl: 1 .  lisinopril (PRINIVIL,ZESTRIL) 20 MG tablet, Take 1 tablet (20 mg total) by mouth daily., Disp: 90 tablet, Rfl: 3 .  tamsulosin (FLOMAX) 0.4 MG CAPS capsule, Take 1 capsule by mouth daily., Disp: , Rfl: 11    Review of Systems     Objective:   Physical Exam  Constitutional: He is oriented to person, place, and time. He  appears well-developed and well-nourished. No distress.  HENT:  Head: Normocephalic and atraumatic.  Right Ear: External ear normal.  Left Ear: External ear normal.  Mouth/Throat: Oropharynx is clear and moist. No oropharyngeal exudate.  Nasal polyposis +  Eyes: Conjunctivae and EOM are normal. Pupils are equal, round, and reactive to light. Right eye exhibits no discharge. Left eye exhibits no discharge. No scleral icterus.  Neck: Normal range of motion. Neck supple. No JVD present. No tracheal deviation present. No thyromegaly present.  Cardiovascular: Normal rate, regular rhythm  and intact distal pulses.  Exam reveals no gallop and no friction rub.   No murmur heard. Pulmonary/Chest: Effort normal and breath sounds normal. No respiratory distress. He has no wheezes. He has no rales. He exhibits no tenderness.  Transmitted upper airwa noise in lung  Abdominal: Soft. Bowel sounds are normal. He exhibits no distension and no mass. There is no tenderness. There is no rebound and no guarding.  Musculoskeletal: Normal range of motion. He exhibits no edema or tenderness.  Lymphadenopathy:    He has no cervical adenopathy.  Neurological: He is alert and oriented to person, place, and time. He has normal reflexes. No cranial nerve deficit. Coordination normal.  Skin: Skin is warm and dry. No rash noted. He is not diaphoretic. No erythema. No pallor.  Psychiatric: He has a normal mood and affect. His behavior is normal. Judgment and thought content normal.  Nursing note and vitals reviewed.  Vitals:   02/19/17 1008  BP: 118/80  Pulse: 77  SpO2: 98%  Weight: 150 lb 6.4 oz (68.2 kg)  Height: 5\' 3"  (1.6 m)       Assessment:    .   ICD-9-CM ICD-10-CM   1. Other chronic sinusitis 473.8 J32.8 CT Maxillofacial LTD WO CM  2. Dyspnea, unspecified type 786.09 R06.00        Plan:     Sinusitis, chronic Take prednisone 40 mg daily x 2 days, then 20mg  daily x 2 days, then 10mg  daily x 2 days,  then 5mg  daily x 2 days and stop  Take doxycycline 100mg  po twice daily x 7 days; take after meals and avoid sunlight   Do CT scan sinus wo contrast - will call with result  Dyspnea Glad this is better Will address at followup when interpreter avaialbe and sinus issues taken care of You might need pulmonary stress test Address dc ace inhibitor at followup  Dr. Brand Males, M.D., [facility].C.P Pulmonary and Critical Care Medicine Staff Physician [place] [facility] Pager: [phone], If no answer or between  15:00h - 7:00h: call [phone]  02/19/2017 10:29 AM

## 2017-02-19 NOTE — Assessment & Plan Note (Signed)
Take prednisone 40 mg daily x 2 days, then 20mg  daily x 2 days, then 10mg  daily x 2 days, then 5mg  daily x 2 days and stop  Take doxycycline 100mg  po twice daily x 7 days; take after meals and avoid sunlight   Do CT scan sinus wo contrast - will call with result

## 2017-02-26 ENCOUNTER — Ambulatory Visit (INDEPENDENT_AMBULATORY_CARE_PROVIDER_SITE_OTHER)
Admission: RE | Admit: 2017-02-26 | Discharge: 2017-02-26 | Disposition: A | Discharge status: Home / Self Care | Payer: Medicaid Other | Source: Ambulatory Visit | Attending: Internal Medicine | Admitting: Internal Medicine

## 2017-02-26 DIAGNOSIS — J328 Other chronic sinusitis: Secondary | ICD-10-CM

## 2017-05-03 ENCOUNTER — Encounter: Payer: Self-pay | Admitting: Physician Assistant

## 2017-05-03 ENCOUNTER — Ambulatory Visit (INDEPENDENT_AMBULATORY_CARE_PROVIDER_SITE_OTHER): Payer: Medicaid Other | Admitting: Physician Assistant

## 2017-05-03 VITALS — BP 140/82 | HR 77 | Ht 63.0 in | Wt 150.4 lb

## 2017-05-03 DIAGNOSIS — I493 Ventricular premature depolarization: Secondary | ICD-10-CM | POA: Diagnosis not present

## 2017-05-03 DIAGNOSIS — I10 Essential (primary) hypertension: Secondary | ICD-10-CM

## 2017-05-03 DIAGNOSIS — R0789 Other chest pain: Secondary | ICD-10-CM

## 2017-05-03 MED ORDER — AMLODIPINE BESYLATE 2.5 MG PO TABS: 2.5000 mg | ORAL_TABLET | Freq: Every day | ORAL | 5 refills | Status: DC

## 2017-05-03 NOTE — Progress Notes (Signed)
Magazine features editor services used for primary language of [place]. Mitzi Hansen [phone]#[id number] assisted as interpreter via video relay.

## 2017-05-03 NOTE — Patient Instructions (Signed)
Medication Instructions:   START amlodipine 2.5mg  DAILY  Labwork:   none  Testing/Procedures:  none   Follow-Up:  3-4 months with Dr. Debara Pickett   If you need a refill on your cardiac medications before your next appointment, please call your pharmacy.

## 2017-05-03 NOTE — Progress Notes (Signed)
Cardiology Office Note    Date:  05/03/2017   ID:  Eduardo West, DOB 1961-04-21, MRN [id number]  PCP:  Rogers Blocker, MD  Cardiologist:  Dr. Debara Pickett  Chief Complaint  Patient presents with  . Follow-up    pt c/o chest pressure, pt c/o SOB because runny nose and congestion worst at night    History of Present Illness:  Random Eduardo West is a 56 y.o. [place] male with past medical history of hypertension, kidney stone, and abnormal heart rhythm. He was initially referred by his primary care physician to cardiology service for evaluation of abnormal heart rhythm. EKG showed ventricular bigeminy with ST and T-wave abnormality laterally concerning for ischemia. He had a previous echocardiogram obtained on 12/26/2013 showed EF 60-65%, moderate LVH. He also had a negative Myoview in June 2015 as well. He has been seen by Dr. Lovena Le twice who started him on flecainide and increased the dose to 100 mg twice a day. He also underwent an ETT to exclude pro arrhythmia with initiation of flecainide on 03/26/2016, this was an inconclusive study, however no significant ST or T-wave changes during stress. He will need another stress test in 2 years for monitoring of the flecainide therapy.  He presents today for 6 month follow-up.  His palpitatin has significantly improved.  He continued to ave occasional chest discomfort both occurs at rest and with exertion. He says his symptom is worse when he is exerting himself. He says he has been having this type of chest discomfort since the initial onset of palpitation last year. The degree and the duration of chest discomfort has not changed in the past year. He says it is a left-sided chest discomfort, also associated with shortness of breath as well. Given negative ETT obtained the last year when he had symptom, I recommended medical therapy for now, I added amlodipine 2.5 mg daily for antianginal purpose. If he continued to have symptom on follow-up, that I would recommend  either treadmill Myoview versus cardiac catheterization for definitive evaluation. I did review his recent CT of the chest, it did not show significant coronary calcification.   Past Medical History:  Diagnosis Date  . Abdominal pain   . Buzzing in ear   . Chronic diarrhea 03/04/2012  . Colon polyp   . Diarrhea   . Diverticulitis of cecum 12/2009   Resected 2011:  COLON, SEGMENTAL RESECTION, RIGHT AND TERMINAL ILEUM : - SEGMENT OF COLON  . Hypertension   . Kidney stones   . Pleural effusion, left   . 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 Right leg numbness     Past Surgical History:  Procedure Laterality Date  . APPENDECTOMY  2003   Dr. Marlou Starks:  Attempted laparoscopic and subsequent open appendectomy  . CHOLECYSTECTOMY  12/2009   Dr. Excell Seltzer  . COLON SURGERY    . FLEXIBLE SIGMOIDOSCOPY N/A 09/13/2013   Procedure: FLEXIBLE SIGMOIDOSCOPY;  Surgeon: Leighton Ruff, MD;  Location: [facility];  Service: Endoscopy;  Laterality: N/A;  . RIGHT COLECTOMY  12/2009   Dr Excell Seltzer: open resection for cecal diverticulitis    Current Medications: Outpatient Medications Prior to Visit  Medication Sig Dispense Refill  . flecainide (TAMBOCOR) 100 MG tablet Take 1 tablet (100 mg total) by mouth 2 (two) times daily. 180 tablet 1  . lisinopril (PRINIVIL,ZESTRIL) 20 MG tablet Take 1 tablet (20 mg total) by mouth daily. 90 tablet 3  . tamsulosin (FLOMAX) 0.4 MG CAPS capsule Take 1 capsule by mouth daily.  11  .  doxycycline (VIBRA-TABS) 100 MG tablet Take 1 tablet, twice daily for 5 days. Avoid sunlight and take after meals. 10 tablet 0  . predniSONE (DELTASONE) 10 MG tablet 40 mg daily x 2 days, then 20mg  daily x 2 days, then 10mg  daily x 2 days, then 5mg  daily x 2 days and stop 15 tablet 0   No facility-administered medications prior to visit.      Allergies:   Patient has no known allergies.   Social History   Social History  . Marital status: Single    Spouse name: N/A  . Number of children: 2  . Years  of education: 8th   Occupational History  . unemployed    Social History Main Topics  . Smoking status: Former Smoker    Packs/day: 0.50    Years: 20.00    Quit date: 12/01/2007  . Smokeless tobacco: Never Used  . Alcohol use No  . Drug use: No  . Sexual activity: Not Asked   Other Topics Concern  . None   Social History Narrative  . None     Family History:  The patient's family history includes Cancer in his mother.   ROS:   Please see the history of present illness.    ROS All other systems reviewed and are negative.   PHYSICAL EXAM:   VS:  BP 140/82   Pulse 77   Ht 5\' 3"  (1.6 m)   Wt 150 lb 6.4 oz (68.2 kg)   BMI 26.64 kg/m    GEN: Well nourished, well developed, in no acute distress  HEENT: normal  Neck: no JVD, carotid bruits, or masses Cardiac: RRR; no murmurs, rubs, or gallops,no edema  Respiratory:  clear to auscultation bilaterally, normal work of breathing GI: soft, nontender, nondistended, + BS MS: no deformity or atrophy  Skin: warm and dry, no rash Neuro:  Alert and Oriented x 3, Strength and sensation are intact Psych: euthymic mood, full affect  Wt Readings from Last 3 Encounters:  05/03/17 150 lb 6.4 oz (68.2 kg)  02/19/17 150 lb 6.4 oz (68.2 kg)  10/30/16 150 lb 9.6 oz (68.3 kg)      Studies/Labs Reviewed:   EKG:  EKG is ordered today.  The ekg ordered today demonstrates Normal sinus rhythm, no obvious ST-T wave changes.  Recent Labs: 08/20/2016: ALT 20; BUN 14; Creatinine, Ser 1.25; Hemoglobin 13.8; Magnesium 1.9; Platelets 307; Potassium 2.7; Sodium 138   Lipid Panel No results found for: CHOL, TRIG, HDL, CHOLHDL, VLDL, LDLCALC, LDLDIRECT  Additional studies/ records that were reviewed today include:   Echo 12/26/2013  LV EF: 60% -  65%  Study Conclusions  - Left ventricle: The cavity size was normal. There was moderate concentric hypertrophy. Systolic function was normal. The estimated ejection fraction was in the  range of 60% to 65%. Wall motion was normal; there were no regional wall motion abnormalities. Doppler parameters are consistent with abnormal left ventricular relaxation (grade 1 diastolic dysfunction). - Aortic valve: Trileaflet; normal thickness leaflets. No regurgitation. - Aorta: The aorta was normal, not dilated, and non-diseased. - Mitral valve: Structurally normal valve. No regurgitation. - Left atrium: The atrium was normal in size. - Right ventricle: Systolic function was mildly reduced. - Right atrium: The atrium was normal in size. - Tricuspid valve: No regurgitation. - Pulmonary arteries: Systolic pressure was within the normal range. - Inferior vena cava: The vessel was normal in size. - Pericardium, extracardiac: There was no pericardial effusion.   ETT 03/26/2016  Study Highlights    There was no ST segment deviation noted during stress.  No T wave inversion was noted during stress.  Negative, inadequate stress test.    ASSESSMENT:    1. Other chest pain   2. Essential hypertension   3. PVC (premature ventricular contraction)      PLAN:  In order of problems listed above:  1. Chronic chest pain: He is been having weekly episode of chest pain both at rest and with exertion, he says the frequency and duration of the chest pain has not changed compared to the last year. He has been having chest pain since the initial onset of palpitation. Therefore this is chronic. The exertional component is somewhat concerning. I recommended addition of amlodipine 2.5 mg daily for antianginal purposes. We will reevaluate his symptom in 3-4 month, if worsens or persists, I favor repeating treadmill Myoview or consider direct cardiac catheterization. I have reviewed his CT of the chest recently, it did not show significant coronary calcification.  2. 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  3. Hypertension: Blood pressure stable on current medication,  continue lisinopril, add low-dose amlodipine not for blood pressure but more for antianginal purposes.    Medication Adjustments/Labs and Tests Ordered: Current medicines are reviewed at length with the patient today.  Concerns regarding medicines are outlined above.  Medication changes, Labs and Tests ordered today are listed in the Patient Instructions below. Patient Instructions  Medication Instructions:   START amlodipine 2.5mg  DAILY  Labwork:   none  Testing/Procedures:  none   Follow-Up:  3-4 months with Dr. Debara Pickett   If you need a refill on your cardiac medications before your next appointment, please call your pharmacy.      Hilbert Corrigan, [place]  05/03/2017 1:11 PM    [facility] [place], [place], [place]  [address] Phone: [phone]; Fax: [phone]

## 2017-05-24 ENCOUNTER — Ambulatory Visit: Payer: Medicaid Other | Admitting: Acute Care

## 2017-06-17 ENCOUNTER — Other Ambulatory Visit: Payer: Self-pay

## 2017-06-17 DIAGNOSIS — I499 Cardiac arrhythmia, unspecified: Principal | ICD-10-CM

## 2017-06-17 DIAGNOSIS — I498 Other specified cardiac arrhythmias: Secondary | ICD-10-CM

## 2017-06-17 MED ORDER — FLECAINIDE ACETATE 100 MG PO TABS: 100.0000 mg | ORAL_TABLET | Freq: Two times a day (BID) | ORAL | 5 refills | Status: DC

## 2017-06-24 ENCOUNTER — Encounter: Payer: Self-pay | Admitting: Acute Care

## 2017-06-24 ENCOUNTER — Ambulatory Visit (INDEPENDENT_AMBULATORY_CARE_PROVIDER_SITE_OTHER): Payer: Medicaid Other | Admitting: Acute Care

## 2017-06-24 ENCOUNTER — Ambulatory Visit (INDEPENDENT_AMBULATORY_CARE_PROVIDER_SITE_OTHER)
Admission: RE | Admit: 2017-06-24 | Discharge: 2017-06-24 | Disposition: A | Discharge status: Home / Self Care | Payer: Medicaid Other | Source: Ambulatory Visit | Attending: Acute Care | Admitting: Acute Care

## 2017-06-24 VITALS — BP 122/76 | HR 80 | Ht 63.0 in | Wt 151.4 lb

## 2017-06-24 DIAGNOSIS — R0600 Dyspnea, unspecified: Secondary | ICD-10-CM

## 2017-06-24 NOTE — Progress Notes (Signed)
History of Present Illness Eduardo West is a 56 y.o. male with dyspnea . He is followed by Dr. Chase Caller.PCP is Kevan Ny.   06/24/2017 2-3 month follow up. This visit was conducted with the use of the patient's daughter as interpreter. The patient speaks little to no [place]. Patient was originally seen by Dr. Chase Caller  on 02/19/2017 for progressive dyspnea over about a year. He had a pleural effusion in the past, and felt that it had recurred based on his symptoms. CT was done, but showed no evidence of interstitial lung disease, or pleural  effusion. Biapical and left basilar pleural parenchymal scarring was noted. He was treated with a prednisone taper, and doxycycline 100 mg twice a day 7 days. He states he completed both of these medications in March. Through the interpreter he specifies he did not feel that he had significant improvement after treatment with the doxycycline and a prednisone taper. Pt. Is here for follow up. He states he is doing well. He states his shortness of breath is the same. He states his dyspnea is worse when he lays down. He is in no distress.  His weight has been stable. He denies cough or any sputum production. He denies wheezing. He denies chest pain, fever, orthopnea, or hemoptysis.  Test Results: CXR 06/24/2017 There are no findings to suggest acute or old tuberculous infection. There is stable scarring at the left lung base with adjacent pleural thickening.   Echo>> 12/26/2013 EF 35-59% Grade 1 diastolic dysfunction  CT Chest>> 01/13/2017  No evidence of interstitial lung disease. Biapical and left basilar pleural parenchymal scarring. Aortic atherosclerosis (ICD10-170.0). Left renal stones.  CBC Latest Ref Rng & Units 08/20/2016 12/05/2014 01/28/2014  WBC 4.0 - 10.5 K/uL 15.5(H) 6.3 7.6  Hemoglobin 13.0 - 17.0 g/dL 13.8 12.9(L) 13.3  Hematocrit 39.0 - 52.0 % 42.3 39.5 40.3  Platelets 150 - 400 K/uL 307 273.0 236    BMP Latest Ref Rng & Units  08/20/2016 04/23/2016 12/05/2014  Glucose 65 - 99 mg/dL 124(H) 71 98  BUN 6 - 20 mg/dL 14 16 13   Creatinine 0.61 - 1.24 mg/dL 1.25(H) 1.20 1.0  Sodium 135 - 145 mmol/L 138 140 139  Potassium 3.5 - 5.1 mmol/L 2.7(LL) 3.2(L) 3.3(L)  Chloride 101 - 111 mmol/L 105 98 103  CO2 22 - 32 mmol/L 24 31 30   Calcium 8.9 - 10.3 mg/dL 9.5 10.0 9.7     ProBNP    Component Value Date/Time   PROBNP 12.0 12/05/2014 1025    Past medical hx Past Medical History:  Diagnosis Date  . Abdominal pain   . Buzzing in ear   . Chronic diarrhea 03/04/2012  . Colon polyp   . Diarrhea   . Diverticulitis of cecum 12/2009   Resected 2011:  COLON, SEGMENTAL RESECTION, RIGHT AND TERMINAL ILEUM : - SEGMENT OF COLON  . Hypertension   . Kidney stones   . Pleural effusion, left   . 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 Right leg numbness      Social History  Substance Use Topics  . Smoking status: Former Smoker    Packs/day: 0.50    Years: 20.00    Quit date: 12/01/2007  . Smokeless tobacco: Never Used  . Alcohol use No    Tobacco Cessation: Counseling given: Yes Former smoker quit 2009  Past surgical hx, Family hx, Social hx all reviewed.  Current Outpatient Prescriptions on File Prior to Visit  Medication Sig  . amLODipine (NORVASC) 2.5 MG tablet  Take 1 tablet (2.5 mg total) by mouth daily.  . flecainide (TAMBOCOR) 100 MG tablet Take 1 tablet (100 mg total) by mouth 2 (two) times daily.  [name] Kitchen lisinopril (PRINIVIL,ZESTRIL) 20 MG tablet Take 1 tablet (20 mg total) by mouth daily.  . tamsulosin (FLOMAX) 0.4 MG CAPS capsule Take 1 capsule by mouth daily.   No current facility-administered medications on file prior to visit.      No Known Allergies  Review Of Systems:  Constitutional:   No  weight loss, night sweats,  Fevers, chills, fatigue, or  lassitude.  HEENT:   No headaches,  Difficulty swallowing,  Tooth/dental problems, or  Sore throat,                No sneezing, itching, ear ache, nasal congestion, post  nasal drip,   CV:  No chest pain,  +Orthopnea, PND, swelling in lower extremities, anasarca, dizziness, palpitations, syncope.   GI  No heartburn, indigestion, abdominal pain, nausea, vomiting, diarrhea, change in bowel habits, loss of appetite, bloody stools.   Resp: + shortness of breath with exertion or at rest.  No excess mucus, no productive cough,  No non-productive cough,  No coughing up of blood.  No change in color of mucus.  No wheezing.  No chest wall deformity  Skin: no rash or lesions.  GU: no dysuria, change in color of urine, no urgency or frequency.  No flank pain, no hematuria   MS:  No joint pain or swelling.  No decreased range of motion.  No back pain.  Psych:  No change in mood or affect. No depression or anxiety.  No memory loss.   Vital Signs BP 122/76 (BP Location: Right Arm, Cuff Size: Normal)   Pulse 80   Ht 5\' 3"  (1.6 m)   Wt 151 lb 6.4 oz (68.7 kg)   SpO2 96%   BMI 26.82 kg/m    Physical Exam:  General- No distress,  A&Ox3, pleasant, flat affect, communicating through daughter as interpreter ENT: No sinus tenderness, TM clear, pale nasal mucosa, no oral exudate,no post nasal drip, no LAN Cardiac: S1, S2, regular rate and rhythm, no murmur Chest: No wheeze/ rales/ dullness; no accessory muscle use, no nasal flaring, no sternal retractions Abd.: Soft Non-tender, non distended,  bowel sounds positive Ext: No clubbing cyanosis, edema Neuro:  normal strength Skin: No rashes, warm and dry Psych: normal mood and behavior   Assessment/Plan  Dyspnea Continued dyspnea Patient is in no apparent distress Patient does not desaturate with ambulatory saturation test Plan We will schedule you for pulmonary function tests. Please have your primary care doctor transition you off your ACEI medication , as these can sometimes contribute to shortness of breath. Consider ARB. Follow up in 4-6 weeks after PFT's with Dr. Chase Caller or NP. Please contact office for  sooner follow up if symptoms do not improve or worsen or seek emergency care  Follow up:  Cardiopulmonary stress test if PFT are unrevealing Consider 2-D echo Consider BNP      Magdalen Spatz, NP 06/24/2017  4:01 PM

## 2017-06-24 NOTE — Assessment & Plan Note (Signed)
Continued dyspnea Patient is in no apparent distress Patient does not desaturate with ambulatory saturation test Plan We will schedule you for pulmonary function tests. Please have your primary care doctor transition you off your ACEI medication , as these can sometimes contribute to shortness of breath. Consider ARB. Follow up in 4-6 weeks after PFT's with Dr. Chase Caller or NP. Please contact office for sooner follow up if symptoms do not improve or worsen or seek emergency care  Follow up:  Cardiopulmonary stress test if PFT are unrevealing Consider 2-D echo Consider BNP

## 2017-06-24 NOTE — Patient Instructions (Addendum)
We will schedule you for pulmonary function tests. Please have your primary care doctor transition you off your ACEI medication , as these can sometimes contribute to shortness of breath. Consider ARB. Follow up in 4-6 weeks after PFT's with Dr. Chase Caller or NP. Please contact office for sooner follow up if symptoms do not improve or worsen or seek emergency care  Follow up:  Cardiopulmonary stress test if PFT are unrevealing Consider 2-D echo Consider BNP

## 2017-06-25 ENCOUNTER — Emergency Department (HOSPITAL_COMMUNITY): Payer: Medicaid Other

## 2017-06-25 ENCOUNTER — Observation Stay (HOSPITAL_COMMUNITY)
Admission: EM | Admit: 2017-06-25 | Discharge: 2017-06-25 | Discharge status: Against Medical Advice | Payer: Medicaid Other | Attending: Emergency Medicine | Admitting: Emergency Medicine

## 2017-06-25 ENCOUNTER — Encounter (HOSPITAL_COMMUNITY): Payer: Self-pay | Admitting: Emergency Medicine

## 2017-06-25 DIAGNOSIS — R079 Chest pain, unspecified: Secondary | ICD-10-CM | POA: Diagnosis not present

## 2017-06-25 DIAGNOSIS — G8929 Other chronic pain: Secondary | ICD-10-CM | POA: Diagnosis not present

## 2017-06-25 DIAGNOSIS — Z79899 Other long term (current) drug therapy: Secondary | ICD-10-CM | POA: Insufficient documentation

## 2017-06-25 DIAGNOSIS — Z86010 Personal history of colonic polyps: Secondary | ICD-10-CM | POA: Diagnosis not present

## 2017-06-25 DIAGNOSIS — Z87891 Personal history of nicotine dependence: Secondary | ICD-10-CM | POA: Diagnosis not present

## 2017-06-25 DIAGNOSIS — R7611 Nonspecific reaction to tuberculin skin test without active tuberculosis: Secondary | ICD-10-CM | POA: Diagnosis not present

## 2017-06-25 DIAGNOSIS — R0609 Other forms of dyspnea: Secondary | ICD-10-CM | POA: Diagnosis present

## 2017-06-25 DIAGNOSIS — R0602 Shortness of breath: Secondary | ICD-10-CM | POA: Insufficient documentation

## 2017-06-25 DIAGNOSIS — Z9049 Acquired absence of other specified parts of digestive tract: Secondary | ICD-10-CM | POA: Diagnosis not present

## 2017-06-25 DIAGNOSIS — R0600 Dyspnea, unspecified: Secondary | ICD-10-CM | POA: Diagnosis not present

## 2017-06-25 DIAGNOSIS — J329 Chronic sinusitis, unspecified: Secondary | ICD-10-CM | POA: Insufficient documentation

## 2017-06-25 DIAGNOSIS — I10 Essential (primary) hypertension: Secondary | ICD-10-CM | POA: Diagnosis present

## 2017-06-25 DIAGNOSIS — Z87442 Personal history of urinary calculi: Secondary | ICD-10-CM | POA: Insufficient documentation

## 2017-06-25 DIAGNOSIS — J90 Pleural effusion, not elsewhere classified: Secondary | ICD-10-CM | POA: Diagnosis not present

## 2017-06-25 DIAGNOSIS — Z7982 Long term (current) use of aspirin: Secondary | ICD-10-CM | POA: Diagnosis not present

## 2017-06-25 DIAGNOSIS — I493 Ventricular premature depolarization: Secondary | ICD-10-CM | POA: Diagnosis not present

## 2017-06-25 DIAGNOSIS — K219 Gastro-esophageal reflux disease without esophagitis: Secondary | ICD-10-CM | POA: Diagnosis not present

## 2017-06-25 DIAGNOSIS — R109 Unspecified abdominal pain: Secondary | ICD-10-CM | POA: Insufficient documentation

## 2017-06-25 DIAGNOSIS — K5732 Diverticulitis of large intestine without perforation or abscess without bleeding: Secondary | ICD-10-CM | POA: Diagnosis not present

## 2017-06-25 DIAGNOSIS — R9431 Abnormal electrocardiogram [ECG] [EKG]: Secondary | ICD-10-CM

## 2017-06-25 LAB — BASIC METABOLIC PANEL
Anion gap: 9 (ref 5–15)
BUN: 11 mg/dL (ref 6–20)
CHLORIDE: 107 mmol/L (ref 101–111)
CO2: 26 mmol/L (ref 22–32)
CREATININE: 1.05 mg/dL (ref 0.61–1.24)
Calcium: 8.9 mg/dL (ref 8.9–10.3)
GFR calc non Af Amer: >60 mL/min (ref >60)
Glucose, Bld: 95 mg/dL (ref 65–99)
Potassium: 3.8 mmol/L (ref 3.5–5.1)
Sodium: 142 mmol/L (ref 135–145)

## 2017-06-25 LAB — CBC
HEMATOCRIT: 36.6 % — AB (ref 39.0–52.0)
HEMOGLOBIN: 12.3 g/dL — AB (ref 13.0–17.0)
MCH: 31.7 pg (ref 26.0–34.0)
MCHC: 33.6 g/dL (ref 30.0–36.0)
MCV: 94.3 fL (ref 78.0–100.0)
Platelets: 275 10*3/uL (ref 150–400)
RBC: 3.88 MIL/uL — AB (ref 4.22–5.81)
RDW: 12.5 % (ref 11.5–15.5)
WBC: 7.4 10*3/uL (ref 4.0–10.5)

## 2017-06-25 LAB — I-STAT TROPONIN, ED
Troponin i, poc: 0 ng/mL (ref 0.00–0.08)
Troponin i, poc: 0 ng/mL (ref 0.00–0.08)

## 2017-06-25 LAB — CBG MONITORING, ED: GLUCOSE-CAPILLARY: 85 mg/dL (ref 65–99)

## 2017-06-25 MED ORDER — ASPIRIN 81 MG PO CHEW
324.0000 mg | CHEWABLE_TABLET | Freq: Once | ORAL | Status: AC
  Administered 2017-06-25: 324 mg via ORAL
  Filled 2017-06-25: qty 4

## 2017-06-25 NOTE — ED Provider Notes (Signed)
Pt visit shared.  Pt with episode of chest pain, seen at PCP's office with EKG concerning for ischemic changes.  Initial plan to admit for observation, pt pain free in ED.  He has been evaluated by Hospitalist and does not wish to stay at this time.  He was discharged against medical advise with plan for cardiology follow-up.   Quintella Reichert, MD 06/26/17 [phone]

## 2017-06-25 NOTE — Consult Note (Signed)
Medical Consultation   Eduardo West  GXQ:[id number]  DOB: 12-May-1961  DOA: 06/25/2017  PCP: Rogers Blocker, MD   Outpatient Specialists:    Requesting physician: Dr. Tyrone Nine   Reason for consultation: Chest pain  History of Present Illness: Eduardo West is an 56 y.o. [place] male with history of intermittent chest pain, chronic shortness of breath, gastroesophageal reflux disease, prior tobacco history presented to the ED with a 2 month history of intermittent left substernal chest pain lasting 5-10 seconds occurring with significant exertion and occasionally when laying supine. Patient describes chest pain as throbbing in nature and nonradiating which eventually subsides. Patient does have some associated shortness of breath which seems to be more chronic. She denies any nausea, no vomiting, no diaphoresis, no fever, no chills, no abdominal pain, no diarrhea, no constipation, no melena, no hematemesis, no hematochezia, no cough, no facial asymmetry, no asymmetric weakness or numbness. Patient denies any family history of premature coronary artery disease. Patient states went to PCPs office and EKG which was done had some EKG changes and patient subsequently sent to the ED. ED physician stated he spoke with cardiology PA Rosaria Ferries who had recommended patient be admitted to cycle enzymes and patient will be evaluated by cardiology.  In the ED, point-of-care troponin was negative. Basic metabolic profile is unremarkable. CBC unremarkable. Chest x-ray is negative for any acute infiltrate. EKG done with left axis deviation, no ischemic changes noted. EKG done at patient's PCPs office he showed some multiple PVCs as well as some lateral ST depression with some flipped T waves. Patient given 324 mg of aspirin. Patient currently chest pain-free and insistent on being discharged and does not want to be hospitalized at this time.  During the interview patient expressing interest in  not being admitted  Patient's daughter interpreted during the interview.      Review of Systems:  ROS As per HPI otherwise 10 point review of systems negative.    Past Medical History: Past Medical History:  Diagnosis Date  . Abdominal pain   . Buzzing in ear   . Chronic diarrhea 03/04/2012  . Colon polyp   . Diarrhea   . Diverticulitis of cecum 12/2009   Resected 2011:  COLON, SEGMENTAL RESECTION, RIGHT AND TERMINAL ILEUM : - SEGMENT OF COLON  . Hypertension   . Kidney stones   . Pleural effusion, left   . 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 Right leg numbness     Past Surgical History: Past Surgical History:  Procedure Laterality Date  . APPENDECTOMY  2003   Dr. Marlou Starks:  Attempted laparoscopic and subsequent open appendectomy  . CHOLECYSTECTOMY  12/2009   Dr. Excell Seltzer  . COLON SURGERY    . FLEXIBLE SIGMOIDOSCOPY N/A 09/13/2013   Procedure: FLEXIBLE SIGMOIDOSCOPY;  Surgeon: Leighton Ruff, MD;  Location: [facility];  Service: Endoscopy;  Laterality: N/A;  . RIGHT COLECTOMY  12/2009   Dr Excell Seltzer: open resection for cecal diverticulitis     Allergies:  No Known Allergies   Social History:  reports that he quit smoking about 9 years ago. He has a 10.00 pack-year smoking history. He has never used smokeless tobacco. He reports that he does not drink alcohol or use drugs.   Family History: Family History  Problem Relation Age of Onset  . Cancer Mother    Mother deceased approximately 15 years ago unknown reason per patient. Father alive and healthy.  Physical Exam: Vitals:   06/25/17 1332 06/25/17 1503  BP: (!) 129/91 (!) 125/99  Pulse: 60 66  Resp: 16 13  Temp: 97.8 F (36.6 C)   TempSrc: Oral   SpO2: 96% 98%    Constitutional: Alert and awake, oriented x3, not in any acute distress. Eyes: PERRLA, EOMI, irises appear normal, anicteric sclera,  ENMT: external ears and nose appear normal,            Lips appears normal, oropharynx mucosa, tongue, posterior pharynx  appear normal  Neck: neck appears normal, no masses, normal ROM, no thyromegaly, no JVD  CVS: S1-S2 clear, no murmur rubs or gallops, no LE edema, normal pedal pulses  Respiratory:  clear to auscultation bilaterally, no wheezing, rales or rhonchi. Respiratory effort normal. No accessory muscle use.  Abdomen: soft nontender, nondistended, normal bowel sounds, no hepatosplenomegaly, no hernias  Musculoskeletal: : no cyanosis, clubbing or edema noted bilaterally. No contractures. Neuro: Cranial nerves II-XII intact, strength, sensation, reflexes Psych: judgement and insight appear normal, stable mood and affect, mental status Skin: no rashes or lesions or ulcers, no induration or nodules.  Data reviewed:  I have personally reviewed following labs and imaging studies Labs:  CBC:  Recent Labs Lab 06/25/17 1323  WBC 7.4  HGB 12.3*  HCT 36.6*  MCV 94.3  PLT 683    Basic Metabolic Panel:  Recent Labs Lab 06/25/17 1323  NA 142  K 3.8  CL 107  CO2 26  GLUCOSE 95  BUN 11  CREATININE 1.05  CALCIUM 8.9   GFR Estimated Creatinine Clearance: 70.1 mL/min (by C-G formula based on SCr of 1.05 mg/dL). Liver Function Tests: No results for input(s): AST, ALT, ALKPHOS, BILITOT, PROT, ALBUMIN in the last 168 hours. No results for input(s): LIPASE, AMYLASE in the last 168 hours. No results for input(s): AMMONIA in the last 168 hours. Coagulation profile No results for input(s): INR, PROTIME in the last 168 hours.  Cardiac Enzymes: No results for input(s): CKTOTAL, CKMB, CKMBINDEX, TROPONINI in the last 168 hours. BNP: Invalid input(s): POCBNP CBG:  Recent Labs Lab 06/25/17 1342  GLUCAP 85   D-Dimer No results for input(s): DDIMER in the last 72 hours. Hgb A1c No results for input(s): HGBA1C in the last 72 hours. Lipid Profile No results for input(s): CHOL, HDL, LDLCALC, TRIG, CHOLHDL, LDLDIRECT in the last 72 hours. Thyroid function studies No results for input(s): TSH,  T4TOTAL, T3FREE, THYROIDAB in the last 72 hours.  Invalid input(s): FREET3 Anemia work up No results for input(s): VITAMINB12, FOLATE, FERRITIN, TIBC, IRON, RETICCTPCT in the last 72 hours. Urinalysis    Component Value Date/Time   COLORURINE AMBER (A) 08/20/2016 0641   APPEARANCEUR CLEAR 08/20/2016 0641   LABSPEC 1.026 08/20/2016 0641   PHURINE 6.0 08/20/2016 0641   GLUCOSEU NEGATIVE 08/20/2016 0641   HGBUR NEGATIVE 08/20/2016 0641   BILIRUBINUR SMALL (A) 08/20/2016 0641   KETONESUR 40 (A) 08/20/2016 0641   PROTEINUR NEGATIVE 08/20/2016 0641   UROBILINOGEN 0.2 10/10/2012 2101   NITRITE NEGATIVE 08/20/2016 0641   LEUKOCYTESUR NEGATIVE 08/20/2016 0641     Microbiology No results found for this or any previous visit (from the past 240 hour(s)).     Inpatient Medications:   Scheduled Meds: Continuous Infusions:   Radiological Exams on Admission: Dg Chest 2 View  Result Date: 06/25/2017 CLINICAL DATA:  Mid chest pain EXAM: CHEST  2 VIEW COMPARISON:  06/24/2017, 12/05/2014 FINDINGS: Stable pleural and parenchymal scarring at the left lung base. No acute  consolidation is seen. Stable cardiomediastinal silhouette. No pneumothorax. IMPRESSION: No active cardiopulmonary disease. Electronically Signed   By: Donavan Foil M.D.   On: 06/25/2017 13:52   Dg Chest 2 View  Result Date: 06/24/2017 CLINICAL DATA:  Chronic dyspnea.  Positive PPD.  Former smoker. EXAM: CHEST  2 VIEW COMPARISON:  CT scan chest of January 13, 2017 and chest x-ray of December 05, 2014 FINDINGS: The right lung is well-expanded and clear. There are chronic interstitial changes at the left lung base with lateral pleural thickening. There is no alveolar infiltrate or pleural effusion. No calcified pulmonary parenchymal nodules or lymph nodes are observed. The heart and pulmonary vascularity are normal. The mediastinum is normal in width. IMPRESSION: There are no findings to suggest acute or old tuberculous infection.  There is stable scarring at the left lung base with adjacent pleural thickening. Electronically Signed   By: David  Martinique M.D.   On: 06/24/2017 12:33    Impression/Recommendations Principal Problem:   Chest pain Active Problems:   Hypertension   GERD (gastroesophageal reflux disease)   DOE (dyspnea on exertion)   #1 chest pain Patient presented with chest with both typical and atypical features. Patient with a history of hypertension, prior history of tobacco abuse. Patient with no family history of premature coronary artery disease. Patient had presented from PCPs office with some EKG changes. Patient in the ED with repeat EKG with no significant ischemic changes. Point-of-care troponin is negative. Chest x-ray negative. Patient currently chest pain-free. ED physician stated had spoke with cardiology who had recommended admission and cardiology will formally assess. Was in the process of admitting the patient went patient stated he was being chest pain-free and insistent on being discharged home and did not want to be admitted at this time. Case discussed with ED physician who was assessing to see if arrangements can be made for patient to be seen by cardiology as outpatient early next week.  #2 hypertension Stable. Continue home regimen.   Time Spent: 54 mins   Madelina Sanda M.D. [facility] 06/25/2017, 3:46 PM

## 2017-06-25 NOTE — ED Notes (Signed)
RN consulting with MD about admission labs

## 2017-06-25 NOTE — ED Notes (Signed)
Pt's CBG=85 

## 2017-06-25 NOTE — ED Triage Notes (Signed)
Patient reports chest pain onset of today, went to PCP and was sent her for further evaluation. Pt c/o central chest pain, non radiating. Pain 2/10. Denies shortness of breath or any other symptoms.

## 2017-06-25 NOTE — ED Provider Notes (Signed)
[facility] DEPT Provider Note   CSN: [id number] Arrival date & time: 06/25/17  1319     History   Chief Complaint Chief Complaint  Patient presents with  . Chest Pain    HPI Eduardo West is a 56 y.o. male.  56 yo M with a chief complaints of chest pain and shortness of breath. This is an ongoing issue for him. Patient has chest pain essentially every day in the morning. Usually is worse when he exerts himself and better with rest. He has been seen by cardiology for this and has had a recent exercise tolerance test that was read as unremarkable. He saw his primary care physician this morning it was concerned about his chest pain as well as an EKG that was abnormal and sent him to the ED for evaluation.   The history is provided by the patient.  Chest Pain   This is a recurrent problem. The current episode started 6 to 12 hours ago. The problem occurs daily. The problem has been resolved. The pain is associated with exertion. The pain is present in the substernal region. The pain is at a severity of 8/10. The pain is moderate. The quality of the pain is described as exertional and heavy. The pain does not radiate. Duration of episode(s) is 2 hours. Associated symptoms include shortness of breath. Pertinent negatives include no abdominal pain, no fever, no headaches, no palpitations and no vomiting. He has tried nothing for the symptoms. The treatment provided no relief.  His past medical history is significant for hypertension.    Past Medical History:  Diagnosis Date  . Abdominal pain   . Buzzing in ear   . Chronic diarrhea 03/04/2012  . Colon polyp   . Diarrhea   . Diverticulitis of cecum 12/2009   Resected 2011:  COLON, SEGMENTAL RESECTION, RIGHT AND TERMINAL ILEUM : - SEGMENT OF COLON  . Hypertension   . Kidney stones   . Pleural effusion, left   . 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 Right leg numbness     Patient Active Problem List   Diagnosis Date Noted  . Sinusitis, chronic  02/19/2017  . Language barrier 10/30/2016  . Encounter for monitoring flecainide therapy 10/29/2016  . Rash 02/19/2015  . Other fatigue 11/14/2014  . Chest pain 04/27/2014  . DOE (dyspnea on exertion) 04/27/2014  . 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 Aspiration pneumonia ([place]) 12/26/2013  . Pleural effusion 12/26/2013  . Pleuritic chest pain 12/26/2013  . GERD (gastroesophageal reflux disease) 12/26/2013  . Chest pain, atypical 12/25/2013  . ECG abnormality 12/25/2013  . Hypertension   . SBO (small bowel obstruction) ([place]) 10/11/2012  . Chronic abdominal pain 03/04/2012  . Chronic diarrhea 03/04/2012  . Back pain 02/04/2012  . 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 Dyspnea 08/22/2010  . Diverticulitis of cecum 12/31/2009    Past Surgical History:  Procedure Laterality Date  . APPENDECTOMY  2003   Dr. Marlou Starks:  Attempted laparoscopic and subsequent open appendectomy  . CHOLECYSTECTOMY  12/2009   Dr. Excell Seltzer  . COLON SURGERY    . FLEXIBLE SIGMOIDOSCOPY N/A 09/13/2013   Procedure: FLEXIBLE SIGMOIDOSCOPY;  Surgeon: Leighton Ruff, MD;  Location: [facility];  Service: Endoscopy;  Laterality: N/A;  . RIGHT COLECTOMY  12/2009   Dr Excell Seltzer: open resection for cecal diverticulitis       Home Medications    Prior to Admission medications   Medication Sig Start Date End Date Taking? Authorizing Provider  flecainide (TAMBOCOR) 100 MG tablet  Take 1 tablet (100 mg total) by mouth 2 (two) times daily. 06/17/17  Yes Almyra Deforest, PA  lisinopril (PRINIVIL,ZESTRIL) 20 MG tablet Take 1 tablet (20 mg total) by mouth daily. 04/29/16  Yes [name], Nadean Corwin, MD  tamsulosin (FLOMAX) 0.4 MG CAPS capsule Take 1 capsule by mouth daily. 01/29/15  Yes [provider]  amLODipine (NORVASC) 2.5 MG tablet Take 1 tablet (2.5 mg total) by mouth daily. Patient not taking: Reported on 06/25/2017 05/03/17 08/01/17  Almyra Deforest, PA    Family History Family History  Problem Relation Age of Onset  . Cancer Mother      Social History Social History  Substance Use Topics  . Smoking status: Former Smoker    Packs/day: 0.50    Years: 20.00    Quit date: 12/01/2007  . Smokeless tobacco: Never Used  . Alcohol use No     Allergies   Patient has no known allergies.   Review of Systems Review of Systems  Constitutional: Negative for chills and fever.  HENT: Negative for congestion and facial swelling.   Eyes: Negative for discharge and visual disturbance.  Respiratory: Positive for shortness of breath.   Cardiovascular: Positive for chest pain. Negative for palpitations.  Gastrointestinal: Negative for abdominal pain, diarrhea and vomiting.  Musculoskeletal: Negative for arthralgias and myalgias.  Skin: Negative for color change and rash.  Neurological: Negative for tremors, syncope and headaches.  Psychiatric/Behavioral: Negative for confusion and dysphoric mood.     Physical Exam Updated Vital Signs BP (!) 129/91 (BP Location: Right Arm)   Pulse 60   Temp 97.8 F (36.6 C) (Oral)   Resp 16   SpO2 96%   Physical Exam  Constitutional: He is oriented to person, place, and time. He appears well-developed and well-nourished.  HENT:  Head: Normocephalic and atraumatic.  Eyes: Pupils are equal, round, and reactive to light. EOM are normal.  Neck: Normal range of motion. Neck supple. No JVD present.  Cardiovascular: Normal rate and regular rhythm.  Exam reveals no gallop and no friction rub.   No murmur heard. Pulmonary/Chest: No respiratory distress. He has no wheezes.  Abdominal: He exhibits no distension and no mass. There is no tenderness. There is no rebound and no guarding.  Midline anterior abdominal scar  Musculoskeletal: Normal range of motion.  Neurological: He is alert and oriented to person, place, and time.  Skin: No rash noted. No pallor.  Psychiatric: He has a normal mood and affect. His behavior is normal.  Nursing note and vitals reviewed.    ED Treatments / Results   Labs (all labs ordered are listed, but only abnormal results are displayed) Labs Reviewed  CBC - Abnormal; Notable for the following:       Result Value   RBC 3.88 (*)    Hemoglobin 12.3 (*)    HCT 36.6 (*)    All other components within normal limits  BASIC METABOLIC PANEL  CBG MONITORING, ED  I-STAT TROPONIN, ED    EKG  EKG Interpretation  Date/Time:  Friday June 25 2017 13:23:35 EDT Ventricular Rate:  61 PR Interval:    QRS Duration: 105 QT Interval:  455 QTC Calculation: 459 R Axis:   -53 Text Interpretation:  Sinus rhythm Prolonged PR interval Consider left atrial enlargement LAD, consider left anterior fascicular block Borderline T wave abnormalities No significant change since last tracing Confirmed by Deno Etienne [phone]) on 06/25/2017 1:25:50 PM       Radiology Dg Chest 2 View  Result Date: 06/25/2017 CLINICAL DATA:  Mid chest pain EXAM: CHEST  2 VIEW COMPARISON:  06/24/2017, 12/05/2014 FINDINGS: Stable pleural and parenchymal scarring at the left lung base. No acute consolidation is seen. Stable cardiomediastinal silhouette. No pneumothorax. IMPRESSION: No active cardiopulmonary disease. Electronically Signed   By: Donavan Foil M.D.   On: 06/25/2017 13:52   Dg Chest 2 View  Result Date: 06/24/2017 CLINICAL DATA:  Chronic dyspnea.  Positive PPD.  Former smoker. EXAM: CHEST  2 VIEW COMPARISON:  CT scan chest of January 13, 2017 and chest x-ray of December 05, 2014 FINDINGS: The right lung is well-expanded and clear. There are chronic interstitial changes at the left lung base with lateral pleural thickening. There is no alveolar infiltrate or pleural effusion. No calcified pulmonary parenchymal nodules or lymph nodes are observed. The heart and pulmonary vascularity are normal. The mediastinum is normal in width. IMPRESSION: There are no findings to suggest acute or old tuberculous infection. There is stable scarring at the left lung base with adjacent pleural thickening.  Electronically Signed   By: David  Martinique M.D.   On: 06/24/2017 12:33    Procedures Procedures (including critical care time)  Medications Ordered in ED Medications  aspirin chewable tablet 324 mg (324 mg Oral Given 06/25/17 1338)     Initial Impression / Assessment and Plan / ED Course  I have reviewed the triage vital signs and the nursing notes.  Pertinent labs & imaging results that were available during my care of the patient were reviewed by me and considered in my medical decision making (see chart for details).     56 yo M With a chief complaint of chest pain and shortness of breath. Patient symptoms do sound somewhat concerning for typical chest pain. He had an EKG in the clinic that showed multiple PVCs as well as lateral ST depression with flipped T waves. EKG performed here with resolution though the patient's symptoms have also resolved. I gave the patient 324 of aspirin. We'll discuss with cardiology.  Discussed with Suanne Marker, recommended admission here at [facility], Cards consult in the morning.    The patients results and plan were reviewed and discussed.   Any x-rays performed were independently reviewed by myself.   Differential diagnosis were considered with the presenting HPI.  Medications  aspirin chewable tablet 324 mg (324 mg Oral Given 06/25/17 1338)    Vitals:   06/25/17 1332  BP: (!) 129/91  Pulse: 60  Resp: 16  Temp: 97.8 F (36.6 C)  TempSrc: Oral  SpO2: 96%    Final diagnoses:  Chest pain with high risk for cardiac etiology  Abnormal resting ECG findings    Admission/ observation were discussed with the admitting physician, patient and/or family and they are comfortable with the plan.    Final Clinical Impressions(s) / ED Diagnoses   Final diagnoses:  Chest pain with high risk for cardiac etiology  Abnormal resting ECG findings    New Prescriptions New Prescriptions   No medications on file     Deno Etienne, DO 06/25/17 1510

## 2017-06-25 NOTE — ED Notes (Signed)
Hospitalist at bedside. Patient is stating he does not want to be admitted. Hospitalist consulting with EDP.

## 2017-06-25 NOTE — ED Notes (Addendum)
ED TO INPATIENT HANDOFF REPORT  Name/Age/Gender Eduardo West 55 y.o. male  Home/SNF/Other Home  Chief Complaint Chest Pain (Sent by Dr)  Code Status History    Date Active Date Inactive Code Status Order ID Comments User Context   01/24/2014 11:04 PM 01/30/2014  8:05 PM Full Code [id number]  Jovita Kussmaul, MD Inpatient   12/25/2013  4:46 PM 12/27/2013  7:20 PM Full Code [id number]  Modena Jansky, MD Inpatient   10/11/2012 12:16 AM 10/15/2012  5:22 PM Full Code [id number]  Adin Hector., MD ED      Level of Care/Admitting Diagnosis ED Disposition    ED Disposition Condition Comment   Admit  [facility] [[id number]]  Level of Care: Telemetry [5]  Admit to tele based on following criteria: Monitor for Ischemic changes  Diagnosis: Chest pain [941740]  Admitting Physician: Eugenie Filler [[id number]]  Attending Physician: Eugenie Filler [[id number]]  PT Class (Do Not Modify): Observation [104]  PT Acc Code (Do Not Modify): Observation [10022]       Medical History Past Medical History:  Diagnosis Date  . Abdominal pain   . Buzzing in ear   . Chronic diarrhea 03/04/2012  . Colon polyp   . Diarrhea   . Diverticulitis of cecum 12/2009   Resected 2011:  COLON, SEGMENTAL RESECTION, RIGHT AND TERMINAL ILEUM : - SEGMENT OF COLON  . Hypertension   . Kidney stones   . Pleural effusion, left   . 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 Right leg numbness     Allergies No Known Allergies  IV Location/Drains/Wounds Patient Lines/Drains/Airways Status   Active Line/Drains/Airways    Name:   Placement date:   Placement time:   Site:   Days:   Peripheral IV 06/25/17 Left Antecubital  06/25/17        Antecubital    less than 1          Labs/Imaging Results for orders placed or performed during the hospital encounter of 06/25/17 (from the past 48 hour(s))  Basic metabolic panel     Status: None   Collection Time: 06/25/17  1:23 PM  Result Value Ref Range   Sodium  142 135 - 145 mmol/L   Potassium 3.8 3.5 - 5.1 mmol/L   Chloride 107 101 - 111 mmol/L   CO2 26 22 - 32 mmol/L   Glucose, Bld 95 65 - 99 mg/dL   BUN 11 6 - 20 mg/dL   Creatinine, Ser 1.05 0.61 - 1.24 mg/dL   Calcium 8.9 8.9 - 10.3 mg/dL   GFR calc non Af Amer >60 >60 mL/min   GFR calc Af Amer >60 >60 mL/min    Comment: (NOTE) The eGFR has been calculated using the CKD EPI equation. This calculation has not been validated in all clinical situations. eGFR's persistently <60 mL/min signify possible Chronic Kidney Disease.    Anion gap 9 5 - 15  CBC     Status: Abnormal   Collection Time: 06/25/17  1:23 PM  Result Value Ref Range   WBC 7.4 4.0 - 10.5 K/uL   RBC 3.88 (L) 4.22 - 5.81 MIL/uL   Hemoglobin 12.3 (L) 13.0 - 17.0 g/dL   HCT 36.6 (L) 39.0 - 52.0 %   MCV 94.3 78.0 - 100.0 fL   MCH 31.7 26.0 - 34.0 pg   MCHC 33.6 30.0 - 36.0 g/dL   RDW 12.5 11.5 - 15.5 %   Platelets 275 150 - 400 K/uL  I-stat troponin, ED (not at [facility])     Status: None   Collection Time: 06/25/17  1:30 PM  Result Value Ref Range   Troponin i, poc 0.00 0.00 - 0.08 ng/mL   Comment 3            Comment: Due to the release kinetics of cTnI, a negative result within the first hours of the onset of symptoms does not rule out myocardial infarction with certainty. If myocardial infarction is still suspected, repeat the test at appropriate intervals.   CBG monitoring, ED     Status: None   Collection Time: 06/25/17  1:42 PM  Result Value Ref Range   Glucose-Capillary 85 65 - 99 mg/dL   Dg Chest 2 View  Result Date: 06/25/2017 CLINICAL DATA:  Mid chest pain EXAM: CHEST  2 VIEW COMPARISON:  06/24/2017, 12/05/2014 FINDINGS: Stable pleural and parenchymal scarring at the left lung base. No acute consolidation is seen. Stable cardiomediastinal silhouette. No pneumothorax. IMPRESSION: No active cardiopulmonary disease. Electronically Signed   By: Donavan Foil M.D.   On: 06/25/2017 13:52   Dg Chest 2 View  Result  Date: 06/24/2017 CLINICAL DATA:  Chronic dyspnea.  Positive PPD.  Former smoker. EXAM: CHEST  2 VIEW COMPARISON:  CT scan chest of January 13, 2017 and chest x-ray of December 05, 2014 FINDINGS: The right lung is well-expanded and clear. There are chronic interstitial changes at the left lung base with lateral pleural thickening. There is no alveolar infiltrate or pleural effusion. No calcified pulmonary parenchymal nodules or lymph nodes are observed. The heart and pulmonary vascularity are normal. The mediastinum is normal in width. IMPRESSION: There are no findings to suggest acute or old tuberculous infection. There is stable scarring at the left lung base with adjacent pleural thickening. Electronically Signed   By: David  Martinique M.D.   On: 06/24/2017 12:33    Pending Labs Unresulted Labs    Start     Ordered   06/25/17 1513  Troponin I (q 6hr x 3)  Now then every 6 hours,   R     06/25/17 1512      Isolation Precautions No active isolations  Vitals/Pain Today's Vitals   06/25/17 1329 06/25/17 1332 06/25/17 1503  BP:  (!) 129/91 (!) 125/99  Pulse:  60 66  Resp:  16 13  Temp:  97.8 F (36.6 C)   TempSrc:  Oral   SpO2:  96% 98%  PainSc: 2       Medications Medications  aspirin chewable tablet 324 mg (324 mg Oral Given 06/25/17 1338)    Mobility walks

## 2017-07-20 ENCOUNTER — Ambulatory Visit: Payer: Medicaid Other | Admitting: Physician Assistant

## 2017-07-21 ENCOUNTER — Ambulatory Visit: Payer: Medicaid Other | Admitting: Physician Assistant

## 2017-07-21 NOTE — Progress Notes (Deleted)
Cardiology Office Note    Date:  07/21/2017   ID:  Eduardo West, DOB May 08, 1961, MRN [id number]  PCP:  Rogers Blocker, MD  Cardiologist:  Dr. Debara Pickett   No chief complaint on file.   History of Present Illness:  Eduardo West is a 56 y.o. [place] male  with past medical history of hypertension, kidney stone, and abnormal heart rhythm. He was initially referred by his primary care physician to cardiology service for evaluation of abnormal heart rhythm. EKG showed ventricular bigeminy with ST and T-wave abnormality laterally concerning for ischemia. He had a previous echocardiogram obtained on 12/26/2013 showed EF 60-65%, moderate LVH. He also had a negative Myoview in June 2015 as well. He has been seen by Dr. Lovena Le twice who started him on flecainide and increased the dose to 100 mg twice a day. He also underwent an ETT to exclude pro arrhythmia with initiation of flecainide on 03/26/2016, this was an inconclusive study, however no significant ST or T-wave changes during stress. He will need another stress test in 2 years for monitoring of the flecainide therapy.  I last saw the patient on 05/03/2017, he was having intermittent chest discomfort. I added amlodipine 2.5 mg daily for antianginal purposes. He was most recently seen in the ED on 06/25/2017 for recurrent chest pain. He was seen by hospitalist service, however he did not wish to stay.  No EKG unless active chest pain Myoview?   Past Medical History:  Diagnosis Date  . Abdominal pain   . Buzzing in ear   . Chronic diarrhea 03/04/2012  . Colon polyp   . Diarrhea   . Diverticulitis of cecum 12/2009   Resected 2011:  COLON, SEGMENTAL RESECTION, RIGHT AND TERMINAL ILEUM : - SEGMENT OF COLON  . Hypertension   . Kidney stones   . Pleural effusion, left   . 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 Right leg numbness     Past Surgical History:  Procedure Laterality Date  . APPENDECTOMY  2003   Dr. Marlou Starks:  Attempted laparoscopic and subsequent open  appendectomy  . CHOLECYSTECTOMY  12/2009   Dr. Excell Seltzer  . COLON SURGERY    . FLEXIBLE SIGMOIDOSCOPY N/A 09/13/2013   Procedure: FLEXIBLE SIGMOIDOSCOPY;  Surgeon: Leighton Ruff, MD;  Location: [facility];  Service: Endoscopy;  Laterality: N/A;  . RIGHT COLECTOMY  12/2009   Dr Excell Seltzer: open resection for cecal diverticulitis    Current Medications: Outpatient Medications Prior to Visit  Medication Sig Dispense Refill  . amLODipine (NORVASC) 2.5 MG tablet Take 1 tablet (2.5 mg total) by mouth daily. (Patient not taking: Reported on 06/25/2017) 30 tablet 5  . flecainide (TAMBOCOR) 100 MG tablet Take 1 tablet (100 mg total) by mouth 2 (two) times daily. 60 tablet 5  . lisinopril (PRINIVIL,ZESTRIL) 20 MG tablet Take 1 tablet (20 mg total) by mouth daily. 90 tablet 3  . tamsulosin (FLOMAX) 0.4 MG CAPS capsule Take 1 capsule by mouth daily.  11   No facility-administered medications prior to visit.      Allergies:   Patient has no known allergies.   Social History   Social History  . Marital status: Single    Spouse name: N/A  . Number of children: 2  . Years of education: 8th   Occupational History  . unemployed    Social History Main Topics  . Smoking status: Former Smoker    Packs/day: 0.50    Years: 20.00    Quit date: 12/01/2007  . Smokeless  tobacco: Never Used  . Alcohol use No  . Drug use: No  . Sexual activity: Not on file   Other Topics Concern  . Not on file   Social History Narrative  . No narrative on file     Family History:  The patient's ***family history includes Cancer in his mother.   ROS:   Please see the history of present illness.    ROS All other systems reviewed and are negative.   PHYSICAL EXAM:   VS:  There were no vitals taken for this visit.   GEN: Well nourished, well developed, in no acute distress  HEENT: normal  Neck: no JVD, carotid bruits, or masses Cardiac: ***RRR; no murmurs, rubs, or gallops,no edema  Respiratory:  clear to  auscultation bilaterally, normal work of breathing GI: soft, nontender, nondistended, + BS MS: no deformity or atrophy  Skin: warm and dry, no rash Neuro:  Alert and Oriented x 3, Strength and sensation are intact Psych: euthymic mood, full affect  Wt Readings from Last 3 Encounters:  06/24/17 151 lb 6.4 oz (68.7 kg)  05/03/17 150 lb 6.4 oz (68.2 kg)  02/19/17 150 lb 6.4 oz (68.2 kg)      Studies/Labs Reviewed:   EKG:  EKG is*** ordered today.  The ekg ordered today demonstrates ***  Recent Labs: 08/20/2016: ALT 20; Magnesium 1.9 06/25/2017: BUN 11; Creatinine, Ser 1.05; Hemoglobin 12.3; Platelets 275; Potassium 3.8; Sodium 142   Lipid Panel No results found for: CHOL, TRIG, HDL, CHOLHDL, VLDL, LDLCALC, LDLDIRECT  Additional studies/ records that were reviewed today include:  ***    ASSESSMENT:    No diagnosis found.   PLAN:  In order of problems listed above:  1. ***    Medication Adjustments/Labs and Tests Ordered: Current medicines are reviewed at length with the patient today.  Concerns regarding medicines are outlined above.  Medication changes, Labs and Tests ordered today are listed in the Patient Instructions below. There are no Patient Instructions on file for this visit.   Hilbert Corrigan, [place]  07/21/2017 7:18 AM    [address], [place], [place]  [address] Phone: [phone]; Fax: [phone]

## 2017-07-26 ENCOUNTER — Ambulatory Visit (INDEPENDENT_AMBULATORY_CARE_PROVIDER_SITE_OTHER): Payer: Medicaid Other | Admitting: Internal Medicine

## 2017-07-26 ENCOUNTER — Encounter: Payer: Self-pay | Admitting: Acute Care

## 2017-07-26 ENCOUNTER — Ambulatory Visit (INDEPENDENT_AMBULATORY_CARE_PROVIDER_SITE_OTHER): Payer: Medicaid Other | Admitting: Acute Care

## 2017-07-26 DIAGNOSIS — R0600 Dyspnea, unspecified: Secondary | ICD-10-CM

## 2017-07-26 LAB — PULMONARY FUNCTION TEST
DL/VA % PRED: 122 %
DL/VA: 5.25 ml/min/mmHg/L
DLCO COR % PRED: 88 %
DLCO COR: 22.56 ml/min/mmHg
DLCO UNC: 22.44 ml/min/mmHg
DLCO unc % pred: 87 %
FEF 25-75 PRE: 1.65 L/s
FEF 25-75 Post: 2.32 L/sec
FEF2575-%CHANGE-POST: 41 %
FEF2575-%PRED-POST: 83 %
FEF2575-%PRED-PRE: 59 %
FEV1-%Change-Post: 9 %
FEV1-%PRED-POST: 93 %
FEV1-%PRED-PRE: 85 %
FEV1-POST: 2.81 L
FEV1-Pre: 2.56 L
FEV1FVC-%Change-Post: 11 %
FEV1FVC-%PRED-PRE: 87 %
FEV6-%Change-Post: -1 %
FEV6-PRE: 3.56 L
FEV6-Post: 3.52 L
FEV6FVC-%Change-Post: 0 %
FVC-%CHANGE-POST: -1 %
FVC-%PRED-POST: 97 %
FVC-%Pred-Pre: 98 %
FVC-Post: 3.62 L
FVC-Pre: 3.67 L
POST FEV1/FVC RATIO: 78 %
POST FEV6/FVC RATIO: 99 %
PRE FEV1/FVC RATIO: 70 %
PRE FEV6/FVC RATIO: 99 %

## 2017-07-26 MED ORDER — TIOTROPIUM BROMIDE MONOHYDRATE 1.25 MCG/ACT IN AERS: 2.0000 | INHALATION_SPRAY | Freq: Every day | RESPIRATORY_TRACT | 0 refills | Status: DC

## 2017-07-26 MED ORDER — TIOTROPIUM BROMIDE MONOHYDRATE 1.25 MCG/ACT IN AERS: 2.0000 | INHALATION_SPRAY | Freq: Every day | RESPIRATORY_TRACT | 4 refills | Status: DC

## 2017-07-26 MED ORDER — ALBUTEROL SULFATE HFA 108 (90 BASE) MCG/ACT IN AERS: 2.0000 | INHALATION_SPRAY | Freq: Four times a day (QID) | RESPIRATORY_TRACT | 2 refills | Status: DC | PRN

## 2017-07-26 NOTE — Patient Instructions (Addendum)
It is good to see you again.  We will start you on an  inhalers today to help.( Spiriva Respimat)  2 puffs once daily This is maintenance medication We will teach you how to use them. We will prescribe an albuterol inhaler also. Use this as needed for breakthrough shortness of breath or wheezing. Rinse mouth with water after use. Follow up with Dr. Chase Caller in 2 months to ensure you are doing well. Please contact office for sooner follow up if symptoms do not improve or worsen or seek emergency care

## 2017-07-26 NOTE — Progress Notes (Signed)
History of Present Illness Eduardo West is a 56 y.o. male never smoker  with Dyspnea . He is followed by Dr. Chase Caller.   07/26/2017  Follow up for dyspnea. Pt. Presents for follow up after being seen 06/24/2017 for dyspnea. He was seen with assistance of interpreter as he speaks [place] as his primary language. He states he still has some shortness of breath with exertion.He does not appear distressed in the office today. He denies cough, or increased sputum production. Oxygen saturations after ambulating to the room are 98% on room air. He denies fever, chest pain, orthopnea or hemoptysis. We discussed the results of his PFTs.   Test Results: PFT's:07/26/2017 Mild obstruction   CT Chest>> 01/13/2017  No evidence of interstitial lung disease. Biapical and left basilar pleural parenchymal scarring. Aortic atherosclerosis (ICD10-170.0). Left renal stones.  CXR 06/24/2017 There are no findings to suggest acute or old tuberculous infection. There is stable scarring at the left lung base with adjacent pleural thickening.   Echo>> 12/26/2013 EF 00-93% Grade 1 diastolic dysfunction  CBC Latest Ref Rng & Units 06/25/2017 08/20/2016 12/05/2014  WBC 4.0 - 10.5 K/uL 7.4 15.5(H) 6.3  Hemoglobin 13.0 - 17.0 g/dL 12.3(L) 13.8 12.9(L)  Hematocrit 39.0 - 52.0 % 36.6(L) 42.3 39.5  Platelets 150 - 400 K/uL 275 307 273.0    BMP Latest Ref Rng & Units 06/25/2017 08/20/2016 04/23/2016  Glucose 65 - 99 mg/dL 95 124(H) 71  BUN 6 - 20 mg/dL 11 14 16   Creatinine 0.61 - 1.24 mg/dL 1.05 1.25(H) 1.20  Sodium 135 - 145 mmol/L 142 138 140  Potassium 3.5 - 5.1 mmol/L 3.8 2.7(LL) 3.2(L)  Chloride 101 - 111 mmol/L 107 105 98  CO2 22 - 32 mmol/L 26 24 31   Calcium 8.9 - 10.3 mg/dL 8.9 9.5 10.0   No results found for: BNP  ProBNP    Component Value Date/Time   PROBNP 12.0 12/05/2014 1025    PFT    Component Value Date/Time   FEV1PRE 2.56 07/26/2017 0901   FEV1POST 2.81 07/26/2017 0901   FVCPRE  3.67 07/26/2017 0901   FVCPOST 3.62 07/26/2017 0901   DLCOUNC 22.44 07/26/2017 0901   PREFEV1FVCRT 70 07/26/2017 0901   PSTFEV1FVCRT 78 07/26/2017 0901     Past medical hx Past Medical History:  Diagnosis Date  . Abdominal pain   . Buzzing in ear   . Chronic diarrhea 03/04/2012  . Colon polyp   . Diarrhea   . Diverticulitis of cecum 12/2009   Resected 2011:  COLON, SEGMENTAL RESECTION, RIGHT AND TERMINAL ILEUM : - SEGMENT OF COLON  . Hypertension   . Kidney stones   . Pleural effusion, left   . 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 Right leg numbness      Social History  Substance Use Topics  . Smoking status: Former Smoker    Packs/day: 0.50    Years: 20.00    Quit date: 12/01/2007  . Smokeless tobacco: Never Used  . Alcohol use No    Mr.Linney reports that he quit smoking about 9 years ago. He has a 10.00 pack-year smoking history. He has never used smokeless tobacco. He reports that he does not drink alcohol or use drugs.  Tobacco Cessation: Former smoker quit 2009  Past surgical hx, Family hx, Social hx all reviewed.  Current Outpatient Prescriptions on File Prior to Visit  Medication Sig  . amLODipine (NORVASC) 2.5 MG tablet Take 1 tablet (2.5 mg total) by mouth daily.  . flecainide (TAMBOCOR)  100 MG tablet Take 1 tablet (100 mg total) by mouth 2 (two) times daily.  [name] Kitchen lisinopril (PRINIVIL,ZESTRIL) 20 MG tablet Take 1 tablet (20 mg total) by mouth daily.  . tamsulosin (FLOMAX) 0.4 MG CAPS capsule Take 1 capsule by mouth daily.   No current facility-administered medications on file prior to visit.      No Known Allergies  Review Of Systems:  Constitutional:   No  weight loss, night sweats,  Fevers, chills, fatigue, or  lassitude.  HEENT:   No headaches,  Difficulty swallowing,  Tooth/dental problems, or  Sore throat,                No sneezing, itching, ear ache, nasal congestion, post nasal drip,   CV:  No chest pain,  Orthopnea, PND, swelling in lower extremities,  anasarca, dizziness, palpitations, syncope.   GI  No heartburn, indigestion, abdominal pain, nausea, vomiting, diarrhea, change in bowel habits, loss of appetite, bloody stools.   Resp: + shortness of breath with exertion less at rest.  No excess mucus, no productive cough,  No non-productive cough,  No coughing up of blood.  No change in color of mucus.  No wheezing.  No chest wall deformity  Skin: no rash or lesions.  GU: no dysuria, change in color of urine, no urgency or frequency.  No flank pain, no hematuria   MS:  No joint pain or swelling.  No decreased range of motion.  No back pain.  Psych:  No change in mood or affect. No depression or anxiety.  No memory loss.   Vital Signs BP 126/84 (BP Location: Right Arm, Cuff Size: Normal)   Pulse 75   Ht 5\' 5"  (1.651 m)   Wt 151 lb (68.5 kg)   SpO2 98%   BMI 25.13 kg/m    Physical Exam:  General- No distress,  A&Ox3, pleasant, non-English speaking ENT: No sinus tenderness, TM clear, pale nasal mucosa, no oral exudate,no post nasal drip, no LAN Cardiac: S1, S2, regular rate and rhythm, no murmur Chest: No wheeze/ rales/ dullness; no accessory muscle use, no nasal flaring, no sternal retractions Abd.: Soft Non-tender, nondistended Ext: No clubbing cyanosis, edema Neuro:  normal strength Skin: No rashes, warm and dry Psych: normal mood and behavior   Assessment/Plan  Dyspnea Dyspnea on exertion PFTs indicate grade 1 obstruction Plan We will initiate monotherapy We will start you on an  inhalers today to help.( Spiriva Respimat)  2 puffs once daily This is maintenance medication We will teach you how to use them. We will prescribe an albuterol inhaler also. Use this as needed for breakthrough shortness of breath or wheezing. Rinse mouth with water after use. Follow up with Dr. Chase Caller in 2 months to ensure you are doing well. Please contact office for sooner follow up if symptoms do not improve or worsen or seek  emergency care    If upon follow-up in 2 months patient continues to have dyspnea despite treatment with Spiriva, consider echo, BNP, cardiopulmonary stress test  Magdalen Spatz, NP 07/26/2017  4:34 PM

## 2017-07-26 NOTE — Progress Notes (Signed)
PFT done today. 

## 2017-07-26 NOTE — Assessment & Plan Note (Signed)
Dyspnea on exertion PFTs indicate grade 1 obstruction Plan We will initiate monotherapy We will start you on an  inhalers today to help.( Spiriva Respimat)  2 puffs once daily This is maintenance medication We will teach you how to use them. We will prescribe an albuterol inhaler also. Use this as needed for breakthrough shortness of breath or wheezing. Rinse mouth with water after use. Follow up with Dr. Chase Caller in 2 months to ensure you are doing well. Please contact office for sooner follow up if symptoms do not improve or worsen or seek emergency care

## 2017-08-03 ENCOUNTER — Ambulatory Visit (INDEPENDENT_AMBULATORY_CARE_PROVIDER_SITE_OTHER): Payer: Medicaid Other | Admitting: Internal Medicine

## 2017-08-03 ENCOUNTER — Encounter: Payer: Self-pay | Admitting: Internal Medicine

## 2017-08-03 VITALS — BP 112/88 | HR 64 | Ht 65.0 in | Wt 148.0 lb

## 2017-08-03 DIAGNOSIS — I10 Essential (primary) hypertension: Secondary | ICD-10-CM | POA: Diagnosis not present

## 2017-08-03 DIAGNOSIS — R0789 Other chest pain: Secondary | ICD-10-CM | POA: Diagnosis not present

## 2017-08-03 DIAGNOSIS — I493 Ventricular premature depolarization: Secondary | ICD-10-CM | POA: Diagnosis not present

## 2017-08-03 NOTE — Patient Instructions (Addendum)
Your physician has recommended you make the following change in your medication:  -- take aspirin 81mg  once daily -- stop amlodipine  Your physician wants you to follow-up in: ONE YEAR with Dr. Debara Pickett. You will receive a reminder letter in the mail two months in advance. If you don't receive a letter, please call our office to schedule the follow-up appointment.  Your appointment on 08/11/17 has been CANCELLED

## 2017-08-03 NOTE — Progress Notes (Signed)
OFFICE NOTE  Chief Complaint:  Occasional palpitations  Primary Care Physician: Rogers Blocker, MD  HPI:  Emrick Hensch is a pleasant 56 year old [name] he is male who is seen today with a Optometrist. He is referred by Dr. Jimmye Norman for evaluation of an abnormal heart rhythm. EKG today shows ventricular bigeminy with ST and T wave abnormalities laterally concerning for ischemia. Mr. Burkitt reports some increasing chest discomfort with exertion as well as shortness of breath. He says that he typically walks 30-45 minutes almost every day but recently has had worsening shortness of breath and his fatigue after only 5-10 minutes. He says that his chest discomfort is worse when exerting himself and released somewhat by rest. There is no significant family history of heart disease per his report although hypertension is his family. He was recently hospitalized for small bowel obstruction and has improved. He has had partial colon resection as well as appendectomy in the past. In January 2015 while hospitalized he was seen for chest pain by Dr. Pernell Dupre. Echocardiogram was recommended which showed an EF of 60-65% with mild diastolic dysfunction and moderate concentric LVH. There were no segmental wall motion abnormalities. He reports his symptoms have progressively gotten worse since his recent hospitalization.  Mr. Isip returns for follow-up. At his last office visit I started him on low-dose beta blocker and he remains in ventricular bigeminy today. The electrical rate is 68 however his pulse rate is half of that at 34. He cannot express whether he has more fatigue or chest discomfort on the medicine than he had before. He certainly does not feel in any improvement on the beta blocker. Pulse rate may be slower.  I had the pleasure seeing Mr. Carattini back in the office today. In the interim he seen Dr. Cristopher Peru twice, who started him on flecainide and increased dose to 100 mg twice a day. This seems  to have been effective in suppressing his PVCs. He does feel somewhat better and has more energy and is less short of breath.  I saw Mr. Lindholm back in the office today with an interpreter. Overall he is doing fairly well. He is currently on flecainide 100 mg twice a day. He reports nearly complete resolution of his palpitations. His EKG shows no evidence of bigeminy. He says he gets occasional sharp chest pain but no significant symptoms that sound anginal. He also has some mild shortness of breath. He underwent a stress test last year which was negative for ischemia. Blood pressure appears well controlled on Dyazide.  10/28/2016  Mr. Vantine returns today for follow-up. He is accompanied by a [place] interpreter. Overall he feels well. He denies any significant palpitations. He has been on flecainide 100 mg twice a day. QTc interval is 467 ms today with EKG demonstrating sinus rhythm and first-degree AV block at 212 ms. He recently was in the hospital for abdominal pain and was given some hydrocodone with improvement. The etiology was unclear. He's not had any significant palpitations. He denies any chest pain. He had a stress test in April of this year which was nonischemic. He will need another stress test in 2 years for monitoring of flecainide therapy.  08/03/2017  Mr. Mccamy returns today for follow-up. Recently he was seen by Almyra Deforest, PA-C for episodes of chest pain at rest and with exertion. Amlodipine was added for antianginal benefit however he says that he feels dizzy on this medication. Blood pressure is low normal today at 112/88.  I suspect he may be getting hypotensive with it. He does not feel that it's improved his chest discomfort. It is also notable that he is not taking aspirin daily. He was supposed to be on this medicine. I went over this with him in the interpreter today and provided samples of 81 mg aspirin. He should take this daily. This is a treatment for coronary  disease.  PMHx:  Past Medical History:  Diagnosis Date  . Abdominal pain   . Buzzing in ear   . Chronic diarrhea 03/04/2012  . Colon polyp   . Diarrhea   . Diverticulitis of cecum 12/2009   Resected 2011:  COLON, SEGMENTAL RESECTION, RIGHT AND TERMINAL ILEUM : - SEGMENT OF COLON  . Hypertension   . Kidney stones   . Pleural effusion, left   . 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 Right leg numbness     Past Surgical History:  Procedure Laterality Date  . APPENDECTOMY  2003   Dr. Marlou Starks:  Attempted laparoscopic and subsequent open appendectomy  . CHOLECYSTECTOMY  12/2009   Dr. Excell Seltzer  . COLON SURGERY    . FLEXIBLE SIGMOIDOSCOPY N/A 09/13/2013   Procedure: FLEXIBLE SIGMOIDOSCOPY;  Surgeon: Leighton Ruff, MD;  Location: [facility];  Service: Endoscopy;  Laterality: N/A;  . RIGHT COLECTOMY  12/2009   Dr Excell Seltzer: open resection for cecal diverticulitis    FAMHx:  Family History  Problem Relation Age of Onset  . Cancer Mother     SOCHx:   reports that he quit smoking about 9 years ago. He has a 10.00 pack-year smoking history. He has never used smokeless tobacco. He reports that he does not drink alcohol or use drugs.  ALLERGIES:  No Known Allergies  ROS: A comprehensive review of systems was negative.  HOME MEDS: Current Outpatient Prescriptions  Medication Sig Dispense Refill  . albuterol (PROVENTIL HFA;VENTOLIN HFA) 108 (90 Base) MCG/ACT inhaler Inhale 2 puffs into the lungs every 6 (six) hours as needed for wheezing or shortness of breath. 1 Inhaler 2  . aspirin EC 81 MG tablet Take 81 mg by mouth daily.    . flecainide (TAMBOCOR) 100 MG tablet Take 1 tablet (100 mg total) by mouth 2 (two) times daily. 60 tablet 5  . lisinopril (PRINIVIL,ZESTRIL) 20 MG tablet Take 1 tablet (20 mg total) by mouth daily. 90 tablet 3  . tamsulosin (FLOMAX) 0.4 MG CAPS capsule Take 1 capsule by mouth daily.  11  . Tiotropium Bromide Monohydrate (SPIRIVA RESPIMAT) 1.25 MCG/ACT AERS Inhale 2 puffs into  the lungs daily. 1 Inhaler 4   No current facility-administered medications for this visit.     LABS/IMAGING: No results found for this or any previous visit (from the past 48 hour(s)). No results found.  VITALS: BP 112/88   Pulse 64   Ht 5\' 5"  (1.651 m)   Wt 148 lb (67.1 kg)   BMI 24.63 kg/m   EXAM: General appearance: alert and no distress Neck: no carotid bruit and no JVD Lungs: clear to auscultation bilaterally Heart: regular rate and rhythm, S1, S2 normal, no murmur, click, rub or gallop Abdomen: soft, non-tender; bowel sounds normal; no masses,  no organomegaly Extremities: extremities normal, atraumatic, no cyanosis or edema Pulses: 2+ and symmetric Skin: Skin color, texture, turgor normal. No rashes or lesions Neurologic: Grossly normal Psych: Pleasant  EKG: Sinus rhythmWith PVCs, incomplete right bundle branch block, lateral T-wave changes-personally reviewed, QTc 447 ms  ASSESSMENT: 1. Ventricular bigeminy with nonconducted PVCs - suppressed on  flecainide 2. Chest pain with exertion - low risk nuclear stress test (2017) 3. Dyspnea on exertion - improved 4. Hypertension  PLAN: 1.   Mr. Hoque has recently been having some low grade chest discomfort with exertion. Nuclear stress testing was negative. He does have coronary artery calcification. He was put on low-dose amlodipine but has been having low blood pressures and gets dizzy and headaches. We'll discontinue that today. He is not taking aspirin, and we've provided samples today and we'll add that to his medication list. His QTC stable on flecainide. He's not had any significant recurrent ventricular arrhythmias.  Follow-up with me annually.  Pixie Casino, MD, [facility] Attending Cardiologist [place] 08/03/2017, 10:01 AM

## 2017-08-11 ENCOUNTER — Ambulatory Visit: Payer: Medicaid Other | Admitting: Physician Assistant

## 2017-09-18 ENCOUNTER — Other Ambulatory Visit: Payer: Self-pay | Admitting: Internal Medicine

## 2017-10-06 ENCOUNTER — Telehealth: Payer: Self-pay | Admitting: Internal Medicine

## 2017-10-06 MED ORDER — ALBUTEROL SULFATE HFA 108 (90 BASE) MCG/ACT IN AERS
2.0000 | INHALATION_SPRAY | Freq: Four times a day (QID) | RESPIRATORY_TRACT | 2 refills | Status: DC | PRN
Start: 2017-10-06 — End: 2017-10-26

## 2017-10-06 NOTE — Telephone Encounter (Signed)
RX's sent to the pharmacy. Left a detailed message advising pt. Nothing further is needed.

## 2017-10-26 ENCOUNTER — Encounter: Payer: Self-pay | Admitting: Internal Medicine

## 2017-10-26 ENCOUNTER — Ambulatory Visit (INDEPENDENT_AMBULATORY_CARE_PROVIDER_SITE_OTHER): Payer: Medicaid Other | Admitting: Internal Medicine

## 2017-10-26 VITALS — BP 120/80 | HR 87 | Ht 63.0 in | Wt 151.2 lb

## 2017-10-26 DIAGNOSIS — R079 Chest pain, unspecified: Secondary | ICD-10-CM | POA: Diagnosis not present

## 2017-10-26 DIAGNOSIS — R0600 Dyspnea, unspecified: Secondary | ICD-10-CM

## 2017-10-26 MED ORDER — ALBUTEROL SULFATE HFA 108 (90 BASE) MCG/ACT IN AERS: 2.0000 | INHALATION_SPRAY | Freq: Four times a day (QID) | RESPIRATORY_TRACT | 5 refills | Status: DC | PRN

## 2017-10-26 NOTE — Patient Instructions (Signed)
ICD-10-CM   1. Dyspnea, unspecified type R06.00   2. Chest pain, unspecified type R07.9     No clear cut evidence of pulmonary problem to explain your symptoms  Plan Refer to cardiology to redo stress test (you had an incomplete exercise stress test in 2017)  Followup 4-6 months or sooner if needed

## 2017-10-26 NOTE — Progress Notes (Signed)
Subjective:     Patient ID: Gregrey Bloyd, male   DOB: September 15, 1961, 56 y.o.   MRN: [id number]  HPI    Primary =  [facility] [address]  56 yo [place] male quit smoking 2010 extremely difficult hx due to language barrier, even thru fm member serving as interpreter   June 06, 2010 1st pulmonary office eval for large L effusion and co fatigue and sob x months. eval by [facility] with abd ct showing large effusion. minimal L chest discomfort, wt loss ? amt. no cough or leg swelling. no fever or sweats rec tap - 600 cc serous yellow exudate 94% L> mature lymphocytes, rec flow cytometry if recurs.   July 24, 2010 Followup on thoracentesis results. Pt states still has some chest discomfort and tightness- worse with inspiration. He denies SOB or cough. Appetite is poor. cxr no real change, rec return in 3 weeks   August 22, 2010 3 wk followup with cxr. Pt c/o increased SOB- esp at in the early evening. Pt states that he is SOB without any exertion at all assoc with centralized cp with coughing only. effusion did not worsen vs the pots tap previous xrary therefore rec Omeprazole 20 mg Take one 30-60 min before first and last meals of the day  GERD diet   September 11, 2010 cc Dyspnea- the same, no better or worse. not losing any weight. no cough now. afraid to eat but not loosing any wt. rec Continue omeprazole 20mg  Take one 30-60 min before first and last meals of the day   October 09, 2010 ov cp all the time but worse with deep breath and sob, not eating. rec conservative f/u   November 05, 2010 ov no change doe/ cp on ppi. December 08, 2010 --Presents for an acute office visit.Complains of SOB especially at night, some heaviness in chest, occ dry cough. Comes and goes. Was seen by PCP last week and sent for xray which showed no change in left effusion. He cont to feel bad w/no energy.Cough persists w/ intermittent fevers, pain in left rib area. Interpreter from [place]. 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 No family exposure. Came to [place] 1992. Was pos on PPD in the past took 20months INH, 1992  Now with chronic effusion. Pt cont to have dry cough and weakness. Pt has intermittent fever. Pt cont to feel very bad.  2-3 months ago had fever and sweating. > health dept eval   December 22, 2010 ov no apparent change in any symptoms x tired from being on tb rx per Health dept. no convincing NS, wt loss, no change on serial cxrs since his orginal tap. rec f/u per HD and return here when released    05/06/2011 ov/[name] discharged by Health dept and  no change at all in chronic chest and abd pain and doe.  rec no change rx, f/u q 3 months until gets primary doctor to follow.       02/04/2012 f/u ov/[name] cc new onset bilateral mid back pain positional, better supine first noted over a year ago. Breathing ok and no cough. Overall pain pattern waxes and wanes but at present is no where near as severe as before. No limiting sob. Never found anything that makes it better. rec rx as IBS, no pulmonary f/u needed.    04/04/2012 f/u ov/[name] cc no better bilateral positional mid back pain,  no assoc cough or sob. Not clear he tried any of the measures rec to date nor has a primary doctor. rec Please remember to go to the x-ray > no change  Return to Dr Jimmye Norman, your primary for further evaluation of your back pain   08/23/2012 f/u ov/[name] cc "no better" cc feeling tired, doe x 50 ft  rec F/u prn    12/05/2014 f/u ov/[name] re: continues with same chronic complaints ? Worse x 2 m Chief Complaint  Patient presents with  . Acute Visit    Pt c/o fatigue and SOB for the past 2-3 months. He c/o nasal congestion at night.        No obvious day to day or daytime variabilty or assoc chronic cough or cp or chest tightness, subjective wheeze overt sinus or hb symptoms. No unusual exp hx or h/o childhood pna/ asthma or knowledge of premature  birth.  Sleeping ok without nocturnal  or early am exacerbation  of respiratory  c/o's or need for noct saba. Also denies any obvious fluctuation of symptoms with weather or environmental changes or other aggravating or alleviating factors except as outlined above  IOV 10/30/2016  Chief Complaint  Patient presents with  . Pulmonary Consult    Pt. is here to follow up to see if he still has fuild in his lungs, Pt. did have a procedure where he had fuild removed from his lungs. Pt. has some trouble breathing mainly at night, Pt. denies chest pain,coughing    56 year old [place] male. He does not speak any [place]. History is gained from interpreter [name] via [employer] . She came on web based service. Patient is an extremely poor historian. As best as I can gather he is had insidious onset of dyspnea for a year. It appears to be a recurrence. It is progressive. It is exertional and episodic and random. There is no associated orthopnea. There is no associated cough with a might be intermittent wheezing. This no clear cut aggravating or relieving factors. It is progressive and moderate severity. He feels that his pleural effusion is back and he is insisting on having a CT scan. And if the effusion is back he wants to have a thoracentesis. Apparently the physician did this from a few years ago and he felt better. 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   reports that he quit smoking about 8 years ago. He has a 10.00 pack-year smoking history. He has never used smokeless tobacco.   OV 02/19/2017  Chief Complaint  Patient presents with  . Follow-up    Pt here after CT chest. Pt states his breathing is doinb better, Pt c/o sinus congestion x week, yellow  sinus mucus. Pt also c/o intermittent chest pain, midsternal.      [name] prsents for followup of dyspnea. In inteim he finally had his sCT chest after insurance delays. No ILD. No emphysema. No tumor. Today the STRATUS machine died for interpretation and his english is actually undestandable. He tells me that the dyspnea is better. However he has 8 weeks of chronic sinusitis,  nasal blockage, runny nose and cough he has been treated with local inhalers but not with antibiotics and steroids according to his history.   EXAM: CT CHEST WITHOUT CONTRAST  TECHNIQUE: Multidetector CT imaging of the chest was performed following the standard protocol without intravenous contrast. High resolution imaging of the lungs, as well as inspiratory and expiratory imaging, was performed.  COMPARISON:  CT abdomen pelvis 08/20/2016.  FINDINGS: Cardiovascular: Atherosclerotic calcification of the arterial vasculature. Heart size normal. No pericardial effusion.  Mediastinum/Nodes: No pathologically enlarged mediastinal or axillary lymph nodes. Hilar regions are difficult to evaluate without IV contrast. Esophagus is grossly unremarkable.  Lungs/Pleura: Minimal biapical pleuroparenchymal scarring. Additional pleural-parenchymal scarring at the base of the left hemithorax. No subpleural reticulation, traction bronchiectasis/bronchiolectasis, ground-glass or architectural distortion. Expiratory phase imaging is not in true expiration, limiting evaluation. No pleural fluid. Airway is unremarkable.  Upper Abdomen: Low-attenuation lesions in the liver measure up to 3.2 cm and are likely cysts although definitive characterization is limited due to size and/or lack of post-contrast imaging. Cholecystectomy. Visualized portions of the adrenal glands and right kidney are unremarkable. Stones are seen in the left kidney. Visualized portions of the spleen, pancreas, stomach and bowel are grossly  unremarkable.  Musculoskeletal: No worrisome lytic or sclerotic lesions. Mild degenerative changes are seen in the spine.  IMPRESSION: 1. No evidence of interstitial lung disease. Biapical and left basilar pleural parenchymal scarring. 2.  Aortic atherosclerosis (ICD10-170.0). 3. Left renal stones.   Electronically Signed   By: Lorin Picket M.D.   On: 01/13/2017 12:39    06/24/2017 2-3 month follow up. This visit was conducted with the use of the patient's daughter as interpreter. The patient speaks little to no [place]. Patient was originally seen by Dr. Chase Caller  on 02/19/2017 for progressive dyspnea over about a year. He had a pleural effusion in the past, and felt that it had recurred based on his symptoms. CT was done, but showed no evidence of interstitial lung disease, or pleural  effusion. Biapical and left basilar pleural parenchymal scarring was noted. He was treated with a prednisone taper, and doxycycline 100 mg twice a day 7 days. He states he completed both of these medications in March. Through the interpreter he specifies he did not feel that he had significant improvement after treatment with the doxycycline and a prednisone taper. Pt. Is here for follow up. He states he is doing well. He states his shortness of breath is the same. He states his dyspnea is worse when he lays down. He is in no distress.  His weight has been stable. He denies cough or any sputum production. He denies wheezing. He denies chest pain, fever, orthopnea, or hemoptysis.    07/26/2017  Follow up for dyspnea. Pt. Presents for follow up after being seen 06/24/2017 for dyspnea. He was seen with assistance of interpreter as he speaks [place] as his primary language. He states he still has some shortness of breath with exertion.He does not appear distressed in the office today. He denies cough, or increased sputum production. Oxygen saturations after ambulating to the room are 98% on room air.  He denies fever, chest pain, orthopnea or hemoptysis. We discussed the results of his PFTs.   Test Results: PFT's:07/26/2017 Mild obstruction   CT Chest>> 01/13/2017  No evidence of interstitial lung disease. Biapical and left basilar pleural parenchymal scarring. Aortic atherosclerosis (ICD10-170.0). Left renal stones.  CXR 06/24/2017 There are no findings to suggest acute or old tuberculous infection. There is stable scarring at the left lung base with adjacent pleural thickening.   Echo>> 12/26/2013 EF 39-76% Grade 1 diastolic dysfunction   OV 10/26/2017  Chief Complaint  Patient presents  with  . Follow-up    Pt states that he feels about the same as last visit. Has complaints of SOB with exertion.     56 year old [place] male who speaks no [place].  History is always given by an interpreter.  Even with the interpreter he is a poor historian.  He presents again for follow-up of idiopathic dyspnea.  As best as I can ascertain this time he tells me that he gets dyspneic only when he walks in a hurry and it is associated with precordial chest pain both relieved by rest there is no associated radiation.  Overall symptoms are stable for many months without any progression.  Symptoms are mild to moderate in intensity and relieved by rest.  There is no associated cough or wheezing or proximal nocturnal dyspnea orthopnea or edema or fever or chills or hemoptysis.  Review of the chart shows in 2017 he had incomplete exercise stress study and a nuclear medicine stress test was recommended but it appears he has not followed through.  On this high resolution CT scan he only has aortic atherosclerosis but not coronary artery calcification.    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   reports that he quit smoking about 9 years ago. He has a 10.00 pack-year smoking history. he has never used smokeless tobacco.  Past Surgical History:  Procedure Laterality Date  . APPENDECTOMY  2003   Dr. Marlou Starks:  Attempted laparoscopic and subsequent open appendectomy  . CHOLECYSTECTOMY  12/2009   Dr. Excell Seltzer  . COLON SURGERY    . FLEXIBLE SIGMOIDOSCOPY N/A 09/13/2013   Procedure: FLEXIBLE SIGMOIDOSCOPY;  Surgeon: Leighton Ruff, MD;  Location: [facility];  Service: Endoscopy;  Laterality: N/A;  . RIGHT COLECTOMY  12/2009   Dr Excell Seltzer: open resection for cecal diverticulitis    No Known Allergies  Immunization History  Administered Date(s) Administered  . Influenza,inj,Quad PF,6+ Mos 09/30/2016  . Influenza-Unspecified 08/14/2017    Family History  Problem Relation Age of Onset  . Cancer Mother      Current Outpatient Medications:  .  albuterol (PROVENTIL HFA;VENTOLIN HFA) 108 (90 Base) MCG/ACT inhaler, Inhale 2 puffs every 6 (six) hours as needed into the lungs for wheezing or shortness of breath., Disp: 1 Inhaler, Rfl: 2 .  aspirin EC 81 MG tablet, Take 81 mg by mouth daily., Disp: , Rfl:  .  flecainide (TAMBOCOR) 100 MG tablet, Take 1 tablet (100 mg total) by mouth 2 (two) times daily., Disp: 60 tablet, Rfl: 5 .  lisinopril (PRINIVIL,ZESTRIL) 20 MG tablet, TAKE 1 TABLET BY MOUTH EVERY DAY, Disp: 90 tablet, Rfl: 3 .  tamsulosin (FLOMAX) 0.4 MG CAPS capsule, Take 1 capsule by mouth daily., Disp: , Rfl: 11 .  Tiotropium Bromide Monohydrate (SPIRIVA RESPIMAT) 1.25 MCG/ACT AERS, Inhale 2 puffs into the lungs daily., Disp: 1 Inhaler, Rfl: 4    Review of Systems     Objective:   Physical Exam Vitals:   10/26/17 0943  BP: 120/80  Pulse: 87  SpO2: 100%  Weight: 151 lb 3.2 oz (68.6 kg)  Height: 5\' 3"  (1.6 m)    Estimated body mass index is 26.78 kg/m as calculated from the following:   Height as of this encounter: 5\' 3"  (1.6 m).   Weight as of this  encounter: 151 lb 3.2 oz (68.6 kg).  Assessment:       ICD-10-CM   1. Dyspnea, unspecified type R06.00   2. Chest pain, unspecified type R07.9        Plan:      No clear cut evidence of pulmonary problem to explain your symptoms  Plan Refer to cardiology to redo stress test (you had an incomplete exercise stress test in 2017)  Followup 4-6 months or sooner if needed   (> 50% of this 15 min visit spent in face to face counseling or/and coordination of care)  Dr. Brand Males, M.D., [facility].C.P Pulmonary and Critical Care Medicine Staff Physician, [place] Director - Interstitial Lung Disease  Program  Pulmonary [place] at [place], [place], [address]  Pager: [phone], If no answer or between  15:00h - 7:00h: call [phone] Telephone: [phone]

## 2017-10-26 NOTE — Addendum Note (Signed)
Addended by: Lorretta Harp on: 10/26/2017 11:14 AM   Modules accepted: Orders

## 2017-11-11 ENCOUNTER — Ambulatory Visit: Payer: Medicaid Other | Admitting: Internal Medicine

## 2017-11-11 ENCOUNTER — Encounter: Payer: Self-pay | Admitting: Internal Medicine

## 2017-11-11 VITALS — BP 152/90 | HR 70 | Ht 63.0 in | Wt 152.2 lb

## 2017-11-11 DIAGNOSIS — I10 Essential (primary) hypertension: Secondary | ICD-10-CM

## 2017-11-11 DIAGNOSIS — R0789 Other chest pain: Secondary | ICD-10-CM | POA: Diagnosis not present

## 2017-11-11 DIAGNOSIS — I493 Ventricular premature depolarization: Secondary | ICD-10-CM | POA: Diagnosis not present

## 2017-11-11 DIAGNOSIS — I700 Atherosclerosis of aorta: Secondary | ICD-10-CM | POA: Diagnosis not present

## 2017-11-11 NOTE — Patient Instructions (Signed)
Your physician recommends that you return for lab work FASTING to check cholesterol   Your physician wants you to follow-up in: ONE YEAR with Dr. [name]. You will receive a reminder letter in the mail two months in advance. If you don't receive a letter, please call our office to schedule the follow-up appointment.   

## 2017-11-13 ENCOUNTER — Encounter: Payer: Self-pay | Admitting: Internal Medicine

## 2017-11-13 DIAGNOSIS — I700 Atherosclerosis of aorta: Secondary | ICD-10-CM | POA: Insufficient documentation

## 2017-11-13 LAB — LIPID PANEL
CHOL/HDL RATIO: 3.4 ratio (ref 0.0–5.0)
Cholesterol, Total: 206 mg/dL — ABNORMAL HIGH (ref 100–199)
HDL: 60 mg/dL (ref >39)
LDL CALC: 124 mg/dL — AB (ref 0–99)
TRIGLYCERIDES: 111 mg/dL (ref 0–149)
VLDL Cholesterol Cal: 22 mg/dL (ref 5–40)

## 2017-11-13 NOTE — Progress Notes (Addendum)
OFFICE NOTE  Chief Complaint:  Follow-up blood pressure  Primary Care Physician: Rogers Blocker, MD  HPI:  Eduardo West is a pleasant 56 year old [name] he is male who is seen today with a Optometrist. He is referred by Dr. Jimmye Norman for evaluation of an abnormal heart rhythm. EKG today shows ventricular bigeminy with ST and T wave abnormalities laterally concerning for ischemia. Eduardo West reports some increasing chest discomfort with exertion as well as shortness of breath. He says that he typically walks 30-45 minutes almost every day but recently has had worsening shortness of breath and his fatigue after only 5-10 minutes. He says that his chest discomfort is worse when exerting himself and released somewhat by rest. There is no significant family history of heart disease per his report although hypertension is his family. He was recently hospitalized for small bowel obstruction and has improved. He has had partial colon resection as well as appendectomy in the past. In January 2015 while hospitalized he was seen for chest pain by Dr. Pernell Dupre. Echocardiogram was recommended which showed an EF of 60-65% with mild diastolic dysfunction and moderate concentric LVH. There were no segmental wall motion abnormalities. He reports his symptoms have progressively gotten worse since his recent hospitalization.  Eduardo West returns for follow-up. At his last office visit I started him on low-dose beta blocker and he remains in ventricular bigeminy today. The electrical rate is 68 however his pulse rate is half of that at 34. He cannot express whether he has more fatigue or chest discomfort on the medicine than he had before. He certainly does not feel in any improvement on the beta blocker. Pulse rate may be slower.  I had the pleasure seeing Eduardo West back in the office today. In the interim he seen Dr. Cristopher Peru twice, who started him on flecainide and increased dose to 100 mg twice a day. This  seems to have been effective in suppressing his PVCs. He does feel somewhat better and has more energy and is less short of breath.  I saw Eduardo West back in the office today with an interpreter. Overall he is doing fairly well. He is currently on flecainide 100 mg twice a day. He reports nearly complete resolution of his palpitations. His EKG shows no evidence of bigeminy. He says he gets occasional sharp chest pain but no significant symptoms that sound anginal. He also has some mild shortness of breath. He underwent a stress test last year which was negative for ischemia. Blood pressure appears well controlled on Dyazide.  10/28/2016  Eduardo West returns today for follow-up. He is accompanied by a [place] interpreter. Overall he feels well. He denies any significant palpitations. He has been on flecainide 100 mg twice a day. QTc interval is 467 ms today with EKG demonstrating sinus rhythm and first-degree AV block at 212 ms. He recently was in the hospital for abdominal pain and was given some hydrocodone with improvement. The etiology was unclear. He's not had any significant palpitations. He denies any chest pain. He had a stress test in April of this year which was nonischemic. He will need another stress test in 2 years for monitoring of flecainide therapy.  08/03/2017  Eduardo West returns today for follow-up. Recently he was seen by Almyra Deforest, PA-C for episodes of chest pain at rest and with exertion. Amlodipine was added for antianginal benefit however he says that he feels dizzy on this medication. Blood pressure is low normal today at  112/88. I suspect he may be getting hypotensive with it. He does not feel that it's improved his chest discomfort. It is also notable that he is not taking aspirin daily. He was supposed to be on this medicine. I went over this with him in the interpreter today and provided samples of 81 mg aspirin. He should take this daily. This is a treatment for coronary  disease.  11/11/2017  Eduardo West returns today for follow-up.  He is accompanied by an interpreter.  When I last saw him we readjusted his medications, namely we discontinued his amlodipine.  He was having problems with swelling and he had no improvement in his chest discomfort.  At times he was also having some hypotension.  Blood pressure was retested today and was initially elevated 152/90 however recheck came back at 124/80 in the left arm and 130/88 in the right arm.  He remains on flecainide and has had no recurrent atrial fibrillation.  EKG today shows sinus rhythm with a QTC of 425 ms.  PMHx:  Past Medical History:  Diagnosis Date  . Abdominal pain   . Buzzing in ear   . Chronic diarrhea 03/04/2012  . Colon polyp   . Diarrhea   . Diverticulitis of cecum 12/2009   Resected 2011:  COLON, SEGMENTAL RESECTION, RIGHT AND TERMINAL ILEUM : - SEGMENT OF COLON  . Hypertension   . Kidney stones   . Pleural effusion, left   . 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 Right leg numbness     Past Surgical History:  Procedure Laterality Date  . APPENDECTOMY  2003   Dr. Marlou Starks:  Attempted laparoscopic and subsequent open appendectomy  . CHOLECYSTECTOMY  12/2009   Dr. Excell Seltzer  . COLON SURGERY    . FLEXIBLE SIGMOIDOSCOPY N/A 09/13/2013   Procedure: FLEXIBLE SIGMOIDOSCOPY;  Surgeon: Leighton Ruff, MD;  Location: [facility];  Service: Endoscopy;  Laterality: N/A;  . RIGHT COLECTOMY  12/2009   Dr Excell Seltzer: open resection for cecal diverticulitis    FAMHx:  Family History  Problem Relation Age of Onset  . Cancer Mother     SOCHx:   reports that he quit smoking about 9 years ago. He has a 10.00 pack-year smoking history. he has never used smokeless tobacco. He reports that he does not drink alcohol or use drugs.  ALLERGIES:  No Known Allergies  ROS: Pertinent items noted in HPI and remainder of comprehensive ROS otherwise negative.  HOME MEDS: Current Outpatient Medications  Medication Sig Dispense  Refill  . albuterol (PROVENTIL HFA;VENTOLIN HFA) 108 (90 Base) MCG/ACT inhaler Inhale 2 puffs into the lungs every 6 (six) hours as needed for wheezing or shortness of breath. 1 Inhaler 5  . aspirin EC 81 MG tablet Take 81 mg by mouth daily.    . flecainide (TAMBOCOR) 100 MG tablet Take 1 tablet (100 mg total) by mouth 2 (two) times daily. 60 tablet 5  . lisinopril (PRINIVIL,ZESTRIL) 20 MG tablet TAKE 1 TABLET BY MOUTH EVERY DAY 90 tablet 3  . tamsulosin (FLOMAX) 0.4 MG CAPS capsule Take 1 capsule by mouth daily.  11  . Tiotropium Bromide Monohydrate (SPIRIVA RESPIMAT) 1.25 MCG/ACT AERS Inhale 2 puffs into the lungs daily. 1 Inhaler 4   No current facility-administered medications for this visit.     LABS/IMAGING: Results for orders placed or performed in visit on 11/11/17 (from the past 48 hour(s))  Lipid panel     Status: Abnormal   Collection Time: 11/12/17 10:05 AM  Result Value  Ref Range   Cholesterol, Total 206 (H) 100 - 199 mg/dL   Triglycerides 111 0 - 149 mg/dL   HDL 60 >39 mg/dL   VLDL Cholesterol Cal 22 5 - 40 mg/dL   LDL Calculated 124 (H) 0 - 99 mg/dL   Chol/HDL Ratio 3.4 0.0 - 5.0 ratio    Comment:                                   T. Chol/HDL Ratio                                             Men  Women                               1/2 Avg.Risk  3.4    3.3                                   Avg.Risk  5.0    4.4                                2X Avg.Risk  9.6    7.1                                3X Avg.Risk 23.4   11.0    No results found.  VITALS: BP (!) 152/90   Pulse 70   Ht 5\' 3"  (1.6 m)   Wt 152 lb 3.2 oz (69 kg)   BMI 26.96 kg/m   EXAM: General appearance: alert and no distress Neck: no carotid bruit and no JVD Lungs: clear to auscultation bilaterally Heart: regular rate and rhythm, S1, S2 normal, no murmur, click, rub or gallop Abdomen: soft, non-tender; bowel sounds normal; no masses,  no organomegaly Extremities: extremities normal, atraumatic, no  cyanosis or edema Pulses: 2+ and symmetric Skin: Skin color, texture, turgor normal. No rashes or lesions Neurologic: Grossly normal Psych: Pleasant  EKG: Sinus rhythm with first-degree AV block, incomplete right bundle branch block, nonspecific T wave changes at 70-personally reviewed (QTC 425 ms)  ASSESSMENT: 1. Ventricular bigeminy with nonconducted PVCs - suppressed on flecainide 2. Chest pain with exertion - low risk nuclear stress test (2017) 3. Dyspnea on exertion - improved 4. Hypertension 5. Aortic atherosclerosis  PLAN: 1.   Mr. Arabie is feeling better off of amlodipine.  I suspect that he has some element of whitecoat hypertension and anxiety.  His blood pressure seems to be running higher in the office.  He was placed on amlodipine and was having episodes of hypotension and no improvement in chest pain symptoms.  He now feels better off of amlodipine.  We will continue his current medications as blood pressure is well controlled. He had a recent high-res CT of the chest in 12/2016, which did demonstrate aortic atherosclerosis. Will check a FLP and consider starting statin therapy.  Follow-up annually or sooner as necessary with [place] interpreter.  Pixie Casino, MD, [facility], [place] Director of the [facility] Lipid [facility] &  [facility] Attending Cardiologist  Direct Dial: [phone]  Fax: [phone]  Website:  [name] 11/13/2017, 3:57 PM

## 2017-11-15 ENCOUNTER — Telehealth: Payer: Self-pay | Admitting: Internal Medicine

## 2017-11-15 ENCOUNTER — Encounter: Payer: Self-pay | Admitting: Internal Medicine

## 2017-11-15 DIAGNOSIS — E7800 Pure hypercholesterolemia, unspecified: Secondary | ICD-10-CM

## 2017-11-15 MED ORDER — ATORVASTATIN CALCIUM 40 MG PO TABS: 40.0000 mg | ORAL_TABLET | Freq: Every day | ORAL | 3 refills | Status: DC

## 2017-11-15 NOTE — Telephone Encounter (Signed)
Called patient via [employer] (interpreter ID [id number]). Patient aware of results + MD recommendations. Rx(s) sent to pharmacy electronically. Repeat lab work ordered. Copy of labs + lab order mailed.

## 2017-11-15 NOTE — Telephone Encounter (Signed)
-----   Message from Pixie Casino, MD sent at 11/14/2017 12:39 PM EST ----- LDL is elevated - goal <100. Recommend starting lipitor 40 mg daily QHS for history of aortic atherosclerosis. Repeat FLP in 3 months.  Dr. Lemmie Evens

## 2017-12-06 ENCOUNTER — Telehealth: Payer: Self-pay | Admitting: Internal Medicine

## 2017-12-06 DIAGNOSIS — I498 Other specified cardiac arrhythmias: Secondary | ICD-10-CM

## 2017-12-06 DIAGNOSIS — I499 Cardiac arrhythmia, unspecified: Principal | ICD-10-CM

## 2017-12-06 MED ORDER — FLECAINIDE ACETATE 100 MG PO TABS: 100.0000 mg | ORAL_TABLET | Freq: Two times a day (BID) | ORAL | 1 refills | Status: DC

## 2017-12-06 NOTE — Telephone Encounter (Signed)
New Message       *STAT* If patient is at the pharmacy, call can be transferred to refill team.   1. Which medications need to be refilled? (please list name of each medication and dose if known)   flecainide (TAMBOCOR) 100 MG tablet Take 1 tablet (100 mg total) by mouth 2 (two) times daily     2. Which pharmacy/location (including street and city if local pharmacy) is medication to be sent to? [facility] [address]   3. Do they need a 30 day or 90 day supply?  Eduardo West

## 2017-12-06 NOTE — Telephone Encounter (Signed)
Rx(s) sent to pharmacy electronically.  

## 2017-12-20 ENCOUNTER — Telehealth: Payer: Self-pay | Admitting: Internal Medicine

## 2017-12-20 NOTE — Telephone Encounter (Signed)
Spoke with Optometrist. Advised her that [facility] has never refilled "Toviaz" for the patient before. There is however a RX refill for Tambocor that was sent in from another dr's office. Advised her to call Dr. Lysbeth Penner office for the refill.   She verbalized understanding. Nothing else needed at time of call.

## 2017-12-20 NOTE — Telephone Encounter (Signed)
F/U call: Pharmacy called back and wanted to cancel refill request  Please cancel refill request... Call should have went to urologist.

## 2017-12-20 NOTE — Telephone Encounter (Signed)
New Message    *STAT* If patient is at the pharmacy, call can be transferred to refill team.   1. Which medications need to be refilled? (please list name of each medication and dose if known) TOVIAZ 4MG    2. Which pharmacy/location (including street and city if local pharmacy) is medication to be sent to? [facility]   3. Do they need a 30 day or 90 day supply? 30    Patient is requesting that the rx be for at least 3 refills

## 2017-12-21 NOTE — Telephone Encounter (Signed)
Acknowledged.

## 2018-02-08 ENCOUNTER — Ambulatory Visit: Payer: Medicaid Other | Admitting: Internal Medicine

## 2018-02-08 ENCOUNTER — Encounter: Payer: Self-pay | Admitting: Internal Medicine

## 2018-02-08 VITALS — BP 122/80 | HR 81 | Ht 63.0 in | Wt 155.4 lb

## 2018-02-08 DIAGNOSIS — Z789 Other specified health status: Secondary | ICD-10-CM

## 2018-02-08 DIAGNOSIS — R0600 Dyspnea, unspecified: Secondary | ICD-10-CM

## 2018-02-08 MED ORDER — TIOTROPIUM BROMIDE MONOHYDRATE 1.25 MCG/ACT IN AERS: 2.0000 | INHALATION_SPRAY | Freq: Every day | RESPIRATORY_TRACT | 5 refills | Status: DC

## 2018-02-08 MED ORDER — ALBUTEROL SULFATE HFA 108 (90 BASE) MCG/ACT IN AERS: 2.0000 | INHALATION_SPRAY | Freq: Four times a day (QID) | RESPIRATORY_TRACT | 5 refills | Status: DC | PRN

## 2018-02-08 NOTE — Progress Notes (Signed)
Subjective:     Patient ID: Eduardo West, male   DOB: [birth year], 57 y.o.   MRN: [id number]  HPI  Primary =  [facility] [address]  57 yo vietnamese male quit smoking 2010 extremely difficult hx due to language barrier, even thru fm member serving as interpreter   June 06, 2010 1st pulmonary office eval for large L effusion and co fatigue and sob x months. eval by [facility] with abd ct showing large effusion. minimal L chest discomfort, wt loss ? amt. no cough or leg swelling. no fever or sweats rec tap - 600 cc serous yellow exudate 94% L> mature lymphocytes, rec flow cytometry if recurs.   July 24, 2010 Followup on thoracentesis results. Eduardo West states still has some chest discomfort and tightness- worse with inspiration. Eduardo West denies SOB or cough. Appetite is poor. cxr no real change, rec return in 3 weeks   August 22, 2010 3 wk followup with cxr. Eduardo West c/o increased SOB- esp at in the early evening. Eduardo West states that Eduardo West is SOB without any exertion at all assoc with centralized cp with coughing only. effusion did not worsen vs the pots tap previous xrary therefore rec Omeprazole 20 mg Take one 30-60 min before first and last meals of the day  GERD diet   September 11, 2010 cc Dyspnea- the same, no better or worse. not losing any weight. no cough now. afraid to eat but not loosing any wt. rec Continue omeprazole 20mg  Take one 30-60 min before first and last meals of the day   October 09, 2010 ov cp all the time but worse with deep breath and sob, not eating. rec conservative f/u   November 05, 2010 ov no change doe/ cp on ppi. December 08, 2010 --Presents for an acute office visit.Complains of SOB especially at night, some heaviness in chest, occ dry cough. Comes and goes. Was seen by PCP last week and sent for xray which showed no change in left effusion. Eduardo West cont to feel bad w/no energy.Cough persists w/ intermittent fevers, pain in left rib area. Interpreter from [employer]. 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 No family exposure. Came to [place] 1992. Was pos on PPD in the past took 44months INH, 1992  Now with chronic effusion. Eduardo West cont to have dry cough and weakness. Eduardo West has intermittent fever. Eduardo West cont to feel very bad.  2-3 months ago had fever and sweating. > health dept eval   December 22, 2010 ov no apparent change in any symptoms x tired from being on tb rx per Health dept. no convincing NS, wt loss, no change on serial cxrs since his orginal tap. rec f/u per HD and return here when released    05/06/2011 ov/[name] discharged by Health dept and  no change at all in chronic chest and abd pain and doe.  rec no change rx, f/u q 3 months until gets primary doctor to follow.       02/04/2012 f/u ov/[name] cc new onset bilateral mid back pain positional, better supine first noted over a year ago. Breathing ok and no cough. Overall pain pattern waxes and wanes but at present is no where near as severe as before. No limiting sob. Never found anything that makes it better. rec rx as IBS, no pulmonary f/u needed.    04/04/2012 f/u ov/[name] cc no better bilateral positional mid back pain, no assoc  cough or sob. Not clear Eduardo West tried any of the measures rec to date nor has a primary doctor. rec Please remember to go to the x-ray > no change  Return to Dr Jimmye Norman, your primary for further evaluation of your back pain   08/23/2012 f/u ov/[name] cc "no better" cc feeling tired, doe x 50 ft  rec F/u prn    12/05/2014 f/u ov/[name] re: continues with same chronic complaints ? Worse x 2 m Chief Complaint  Patient presents with  . Acute Visit    Eduardo West c/o fatigue and SOB for the past 2-3 months. Eduardo West c/o nasal congestion at night.        No obvious day to day or daytime variabilty or assoc chronic cough or cp or chest tightness, subjective wheeze overt sinus or hb symptoms. No unusual exp hx or h/o childhood pna/ asthma or knowledge of premature  birth.  Sleeping ok without nocturnal  or early am exacerbation  of respiratory  c/o's or need for noct saba. Also denies any obvious fluctuation of symptoms with weather or environmental changes or other aggravating or alleviating factors except as outlined above  IOV 10/30/2016  Chief Complaint  Patient presents with  . Pulmonary Consult    Eduardo West. is here to follow up to see if Eduardo West still has fuild in his lungs, Eduardo West. did have a procedure where Eduardo West had fuild removed from his lungs. Eduardo West. has some trouble breathing mainly at night, Eduardo West. denies chest pain,coughing    57 year old [place] male. Eduardo West does not speak any [place]. History is gained from interpreter [name] via [employer] . She came on web based service. Patient is an extremely poor historian. As best as I can gather Eduardo West is had insidious onset of dyspnea for a year. It appears to be a recurrence. It is progressive. It is exertional and episodic and random. There is no associated orthopnea. There is no associated cough with a might be intermittent wheezing. This no clear cut aggravating or relieving factors. It is progressive and moderate severity. Eduardo West feels that his pleural effusion is back and Eduardo West is insisting on having a CT scan. And if the effusion is back Eduardo West wants to have a thoracentesis. Apparently the physician did this from a few years ago and Eduardo West felt better. 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   reports that Eduardo West quit smoking about 8 years ago. Eduardo West has a 10.00 pack-year smoking history. Eduardo West has never used smokeless tobacco.   OV 02/19/2017  Chief Complaint  Patient presents with  . Follow-up    Eduardo West here after CT chest. Eduardo West states his breathing is doinb better, Eduardo West c/o sinus congestion x week, yellow  sinus mucus. Eduardo West also c/o intermittent chest pain, midsternal.      [name] prsents for followup of dyspnea. In inteim Eduardo West finally had his sCT chest after insurance delays. No ILD. No emphysema. No tumor. Today the STRATUS machine died for interpretation and his english is actually undestandable. Eduardo West tells me that the dyspnea is better. However Eduardo West has 8 weeks of chronic sinusitis,  nasal blockage, runny nose and cough Eduardo West has been treated with local inhalers but not with antibiotics and steroids according to his history.   EXAM: CT CHEST WITHOUT CONTRAST  TECHNIQUE: Multidetector  CT imaging of the chest was performed following the standard protocol without intravenous contrast. High resolution imaging of the lungs, as well as inspiratory and expiratory imaging, was performed.  COMPARISON:  CT abdomen pelvis 08/20/2016.  FINDINGS: Cardiovascular: Atherosclerotic calcification of the arterial vasculature. Heart size normal. No pericardial effusion.  Mediastinum/Nodes: No pathologically enlarged mediastinal or axillary lymph nodes. Hilar regions are difficult to evaluate without IV contrast. Esophagus is grossly unremarkable.  Lungs/Pleura: Minimal biapical pleuroparenchymal scarring. Additional pleural-parenchymal scarring at the base of the left hemithorax. No subpleural reticulation, traction bronchiectasis/bronchiolectasis, ground-glass or architectural distortion. Expiratory phase imaging is not in true expiration, limiting evaluation. No pleural fluid. Airway is unremarkable.  Upper Abdomen: Low-attenuation lesions in the liver measure up to 3.2 cm and are likely cysts although definitive characterization is limited due to size and/or lack of post-contrast imaging. Cholecystectomy. Visualized portions of the adrenal glands and right kidney are unremarkable. Stones are seen in the left kidney. Visualized portions of the spleen, pancreas, stomach and bowel are grossly  unremarkable.  Musculoskeletal: No worrisome lytic or sclerotic lesions. Mild degenerative changes are seen in the spine.  IMPRESSION: 1. No evidence of interstitial lung disease. Biapical and left basilar pleural parenchymal scarring. 2.  Aortic atherosclerosis (ICD10-170.0). 3. Left renal stones.   Electronically Signed   By: Lorin Picket M.D.   On: 01/13/2017 12:39    06/24/2017 2-3 month follow up. This visit was conducted with the use of the patient's daughter as interpreter. The patient speaks little to no [place]. Patient was originally seen by Dr. Chase Caller  on 02/19/2017 for progressive dyspnea over about a year. Eduardo West had a pleural effusion in the past, and felt that it had recurred based on his symptoms. CT was done, but showed no evidence of interstitial lung disease, or pleural  effusion. Biapical and left basilar pleural parenchymal scarring was noted. Eduardo West was treated with a prednisone taper, and doxycycline 100 mg twice a day 7 days. Eduardo West states Eduardo West completed both of these medications in March. Through the interpreter Eduardo West specifies Eduardo West did not feel that Eduardo West had significant improvement after treatment with the doxycycline and a prednisone taper. Eduardo West. Is here for follow up. Eduardo West states Eduardo West is doing well. Eduardo West states his shortness of breath is the same. Eduardo West states his dyspnea is worse when Eduardo West lays down. Eduardo West is in no distress.  His weight has been stable. Eduardo West denies cough or any sputum production. Eduardo West denies wheezing. Eduardo West denies chest pain, fever, orthopnea, or hemoptysis.    07/26/2017  Follow up for dyspnea. Eduardo West. Presents for follow up after being seen 06/24/2017 for dyspnea. Eduardo West was seen with assistance of interpreter as Eduardo West speaks [place] as his primary language. Eduardo West states Eduardo West still has some shortness of breath with exertion.Eduardo West does not appear distressed in the office today. Eduardo West denies cough, or increased sputum production. Oxygen saturations after ambulating to the room are 98% on room air.  Eduardo West denies fever, chest pain, orthopnea or hemoptysis. We discussed the results of his PFTs.   Test Results: PFT's:07/26/2017 Mild obstruction   CT Chest>> 01/13/2017  No evidence of interstitial lung disease. Biapical and left basilar pleural parenchymal scarring. Aortic atherosclerosis (ICD10-170.0). Left renal stones.  CXR 06/24/2017 There are no findings to suggest acute or old tuberculous infection. There is stable scarring at the left lung base with adjacent pleural thickening.   Echo>> 12/26/2013 EF 03-54% Grade 1 diastolic dysfunction   OV 10/26/2017  Chief Complaint  Patient presents with  .  Follow-up    Eduardo West states that Eduardo West feels about the same as last visit. Has complaints of SOB with exertion.     57 year old [place] male who speaks no [place].  History is always given by an interpreter.  Even with the interpreter Eduardo West is a poor historian.  Eduardo West presents again for follow-up of idiopathic dyspnea.  As best as I can ascertain this time Eduardo West tells me that Eduardo West gets dyspneic only when Eduardo West walks in a hurry and it is associated with precordial chest pain both relieved by rest there is no associated radiation.  Overall symptoms are stable for many months without any progression.  Symptoms are mild to moderate in intensity and relieved by rest.  There is no associated cough or wheezing or proximal nocturnal dyspnea orthopnea or edema or fever or chills or hemoptysis.  Review of the chart shows in 2017 Eduardo West had incomplete exercise stress study and a nuclear medicine stress test was recommended but it appears Eduardo West has not followed through.  On this high resolution CT scan Eduardo West only has aortic atherosclerosis but not coronary artery calcification.  OV  02/08/2018  Chief Complaint  Patient presents with  . Follow-up    Eduardo West states Eduardo West is much better than Eduardo West was at last visit. Eduardo West does have some SOB at night.    57 year old [place] male affected by language barrier.  Follow-up for  idiopathic dyspnea.  Poor historian despite having an interpreter  In August 2018 nurse practitioner put him on Spiriva.  In her report she indicated that pulmonary function test showed grade 1 obstruction.  In my personal opinion Eduardo West only had a possible component of small airway disease.  At this point in time did not show that Eduardo West is taking Spiriva.  Eduardo West says Eduardo West takes some inhaler at night.  Eduardo West thinks the color of this inhaler is red.  This would imply Eduardo West is taking albuterol at night.  Eduardo West says it helps him and therefore it appears that Eduardo West is having some relief in his dyspnea from inhalers.  In review of the chart it shows that after my last visit with him in November 2018 Eduardo West saw Dr. Debara Pickett the cardiologist.  His cardiac medications have been adjusted.  His cardiac stress test was deemed low risk.  Eduardo West has been given some medication for PVCs.  And it appears these measures have improved him.  Patient also seems to agree so.  At this point in time Eduardo West has no cough.  Although the interpreter seems to tell me that when Eduardo West exerts a lot Eduardo West might have a mild wheezing.  But overall improved.   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   reports that Eduardo West quit smoking about 10 years ago. Eduardo West has a 10.00 pack-year smoking history. Eduardo West has never used smokeless tobacco.  Past Surgical History:  Procedure Laterality Date  . APPENDECTOMY  2003   Dr. Marlou Starks:  Attempted laparoscopic and subsequent open appendectomy  . CHOLECYSTECTOMY  12/2009   Dr. Excell Seltzer  . COLON SURGERY    . FLEXIBLE SIGMOIDOSCOPY N/A 09/13/2013   Procedure: FLEXIBLE SIGMOIDOSCOPY;  Surgeon: Leighton Ruff, MD;  Location: [facility];  Service: Endoscopy;  Laterality: N/A;  . RIGHT COLECTOMY  12/2009   Dr Excell Seltzer: open resection for cecal  diverticulitis    No Known Allergies  Immunization  History  Administered Date(s) Administered  . Influenza,inj,Quad PF,6+ Mos 09/30/2016  . Influenza-Unspecified 08/14/2017    Family History  Problem Relation Age of Onset  . Cancer Mother      Current Outpatient Medications:  .  albuterol (PROVENTIL HFA;VENTOLIN HFA) 108 (90 Base) MCG/ACT inhaler, Inhale 2 puffs into the lungs every 6 (six) hours as needed for wheezing or shortness of breath., Disp: 1 Inhaler, Rfl: 5 .  aspirin EC 81 MG tablet, Take 81 mg by mouth daily., Disp: , Rfl:  .  atorvastatin (LIPITOR) 40 MG tablet, Take 1 tablet (40 mg total) by mouth daily., Disp: 90 tablet, Rfl: 3 .  flecainide (TAMBOCOR) 100 MG tablet, Take 1 tablet (100 mg total) by mouth 2 (two) times daily., Disp: 180 tablet, Rfl: 1 .  lisinopril (PRINIVIL,ZESTRIL) 20 MG tablet, TAKE 1 TABLET BY MOUTH EVERY DAY, Disp: 90 tablet, Rfl: 3 .  tamsulosin (FLOMAX) 0.4 MG CAPS capsule, Take 1 capsule by mouth daily., Disp: , Rfl: 11 .  Tiotropium Bromide Monohydrate (SPIRIVA RESPIMAT) 1.25 MCG/ACT AERS, Inhale 2 puffs into the lungs daily., Disp: 1 Inhaler, Rfl: 4    Review of Systems     Objective:   Physical Exam  Constitutional: Eduardo West is oriented to person, place, and time. Eduardo West appears well-developed and well-nourished. No distress.  HENT:  Head: Normocephalic and atraumatic.  Right Ear: External ear normal.  Left Ear: External ear normal.  Mouth/Throat: Oropharynx is clear and moist. No oropharyngeal exudate.  Eyes: Conjunctivae and EOM are normal. Pupils are equal, round, and reactive to light. Right eye exhibits no discharge. Left eye exhibits no discharge. No scleral icterus.  Neck: Normal range of motion. Neck supple. No JVD present. No tracheal deviation present. No thyromegaly present.  Cardiovascular: Normal rate, regular rhythm and intact distal pulses. Exam reveals no gallop and no friction rub.  No murmur heard. Pulmonary/Chest: Effort  normal and breath sounds normal. No respiratory distress. Eduardo West has no wheezes. Eduardo West has no rales. Eduardo West exhibits no tenderness.  Abdominal: Soft. Bowel sounds are normal. Eduardo West exhibits no distension and no mass. There is no tenderness. There is no rebound and no guarding.  Musculoskeletal: Normal range of motion. Eduardo West exhibits no edema or tenderness.  Lymphadenopathy:    Eduardo West has no cervical adenopathy.  Neurological: Eduardo West is alert and oriented to person, place, and time. Eduardo West has normal reflexes. No cranial nerve deficit. Coordination normal.  Skin: Skin is warm and dry. No rash noted. Eduardo West is not diaphoretic. No erythema. No pallor.  Psychiatric: Eduardo West has a normal mood and affect. His behavior is normal. Judgment and thought content normal.  Nursing note and vitals reviewed.  Vitals:   02/08/18 0931  BP: 122/80  Pulse: 81  SpO2: 98%  Weight: 155 lb 6.4 oz (70.5 kg)  Height: 5\' 3"  (1.6 m)        Assessment:       ICD-10-CM   1. Dyspnea, unspecified type R06.00   2. Language barrier Z78.9    Still idiopathic dyspnea.  Not sure if the inhaler possibly albuterol is helping him as a placebo effect.  We will continue to follow him on a conservative basis but still keep a close eye.  At this point in time we will just resolved to continue his inhaler therapy although I have emphasized to him that Eduardo West should take Spiriva.    Plan:      Glad you are better after cardiac medication adjustment and seeing Dr Suzzanne Cloud  you seem to think inhaler is helping you although I am not sure which inhaler you are taking  Plan - take spiriva daily scheduled without fail  - CMA to ensure 9 month refill - take albuterol only as needed - ensure refill - next time bring your inhalers with you  Followup - 9 months or sooner with Dr Chase Caller o rAPP   (> 50% of this 15 min visit spent in face to face counseling or/and coordination of care)     Dr. Brand Males, M.D., [facility].C.P Pulmonary and Critical Care  Medicine Staff Physician, [place] Director - Interstitial Lung Disease  Program  Pulmonary [place] [facility] [place], [place], [address]  Pager: [phone], If no answer or between  15:00h - 7:00h: call [phone] Telephone: [phone]

## 2018-02-08 NOTE — Patient Instructions (Addendum)
ICD-10-CM   1. Dyspnea, unspecified type R06.00   2. Language barrier Z78.9     Glad you are better after cardiac medication adjustment and seeing Dr Debara Pickett Glad you seem to think inhaler is helping you although I am not sure which inhaler you are taking  Plan - take spiriva daily scheduled without fail  - CMA to ensure 9 month refill - take albuterol only as needed - ensure refill - next time bring your inhalers with you  Followup - 9 months or sooner with Dr Chase Caller o rAPP

## 2018-02-14 LAB — LIPID PANEL
Chol/HDL Ratio: 1.6 ratio (ref 0.0–5.0)
Cholesterol, Total: 107 mg/dL (ref 100–199)
HDL: 67 mg/dL (ref >39)
LDL Calculated: 31 mg/dL (ref 0–99)
Triglycerides: 45 mg/dL (ref 0–149)
VLDL Cholesterol Cal: 9 mg/dL (ref 5–40)

## 2018-02-15 ENCOUNTER — Encounter: Payer: Self-pay | Admitting: Internal Medicine

## 2018-05-25 ENCOUNTER — Telehealth: Payer: Self-pay | Admitting: Internal Medicine

## 2018-05-25 DIAGNOSIS — I499 Cardiac arrhythmia, unspecified: Principal | ICD-10-CM

## 2018-05-25 DIAGNOSIS — I498 Other specified cardiac arrhythmias: Secondary | ICD-10-CM

## 2018-05-25 MED ORDER — FLECAINIDE ACETATE 100 MG PO TABS: 100.0000 mg | ORAL_TABLET | Freq: Two times a day (BID) | ORAL | 1 refills | Status: DC

## 2018-05-25 NOTE — Telephone Encounter (Signed)
Rx(s) sent to pharmacy electronically.  

## 2018-05-25 NOTE — Telephone Encounter (Signed)
New Message     *STAT* If patient is at the pharmacy, call can be transferred to refill team.   1. Which medications need to be refilled? (please list name of each medication and dose if known) flecainide (TAMBOCOR) 100 MG tablet  2. Which pharmacy/location (including street and city if local pharmacy) is medication to be sent to? [facility] [place], [place] - [address] AT [address]  3. Do they need a 30 day or 90 day supply? 90 day    Patient is requesting multiple refills

## 2018-05-25 NOTE — Telephone Encounter (Signed)
Pt calling concerning previous refill request and is requesting a call back to confirm prescription has been sent. Please call pt

## 2018-07-10 IMAGING — DX DG CHEST 2V
2 series · 2 of 2 positions shown · non-contrast
Comparison: CT scan chest January 13, 2017 and chest x-ray December 05, 2014

CLINICAL DATA: Chronic dyspnea.  Positive PPD.  Former smoker.

EXAM:
CHEST  2 VIEW

[chest pa]
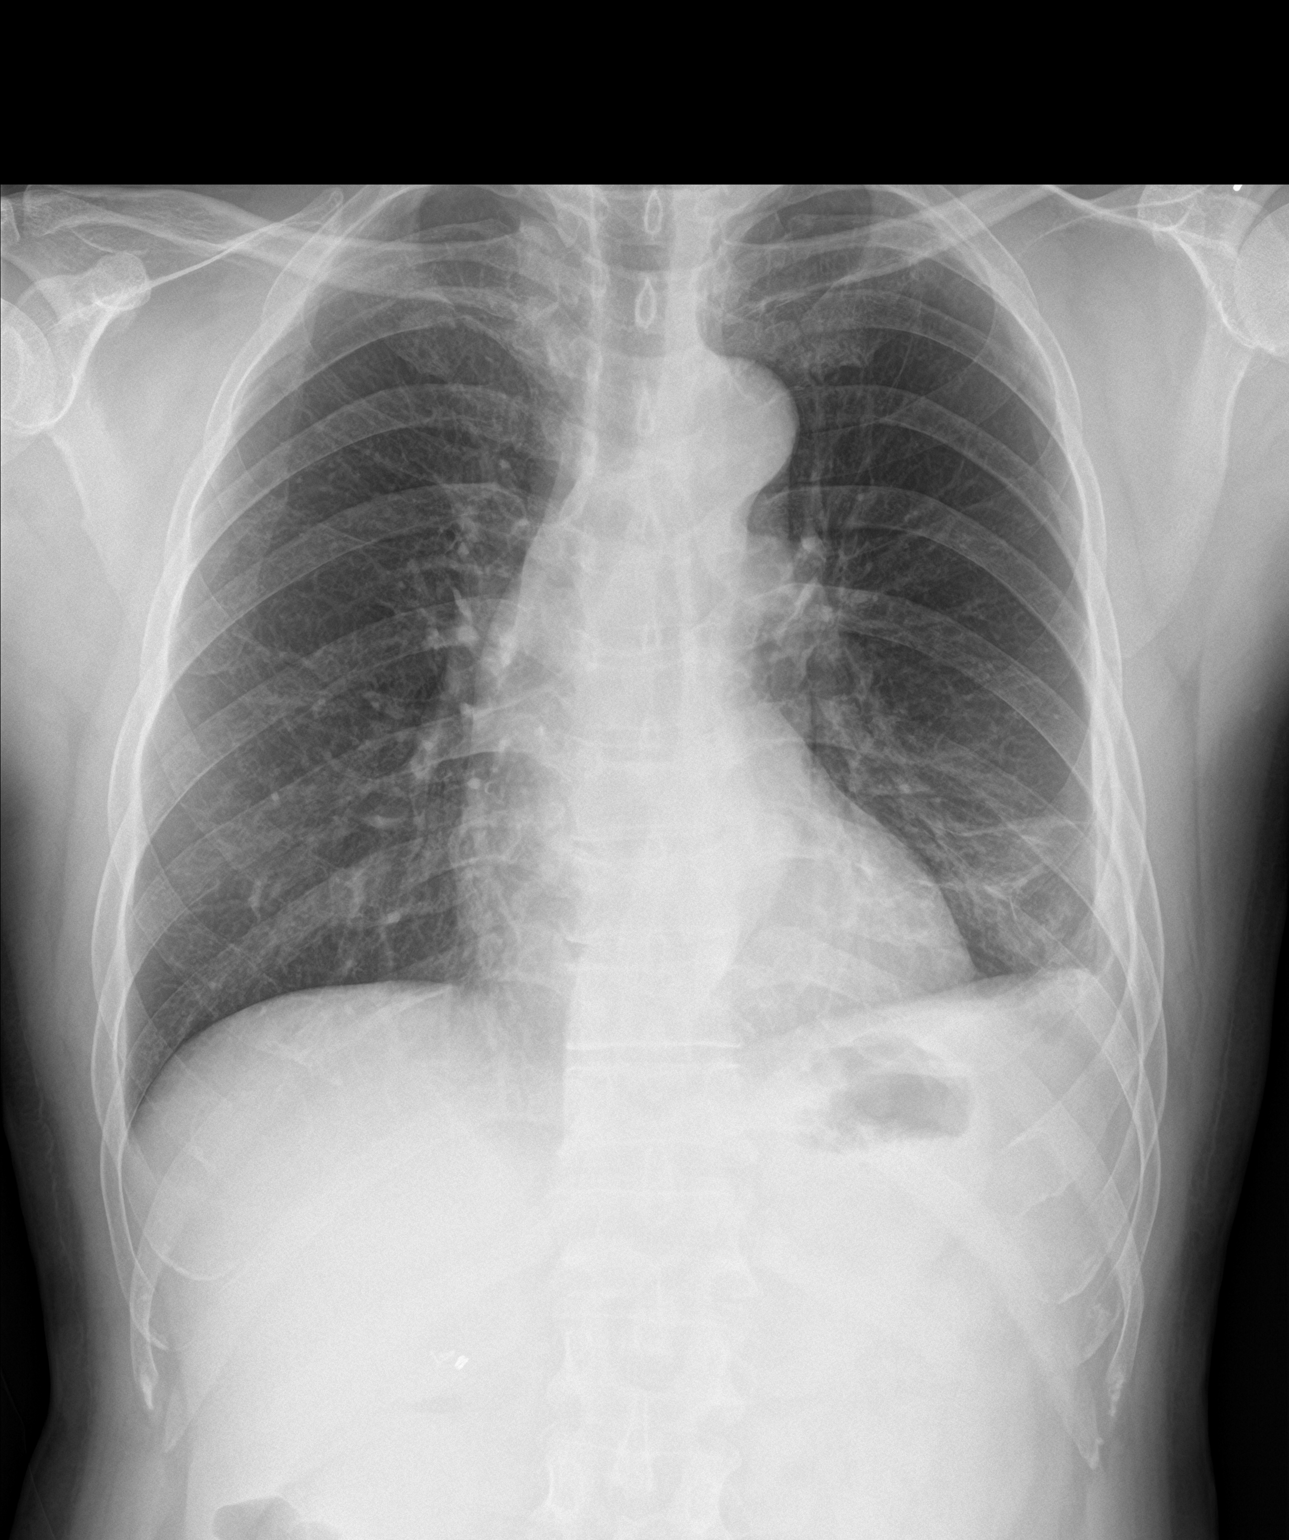

[chest lat]
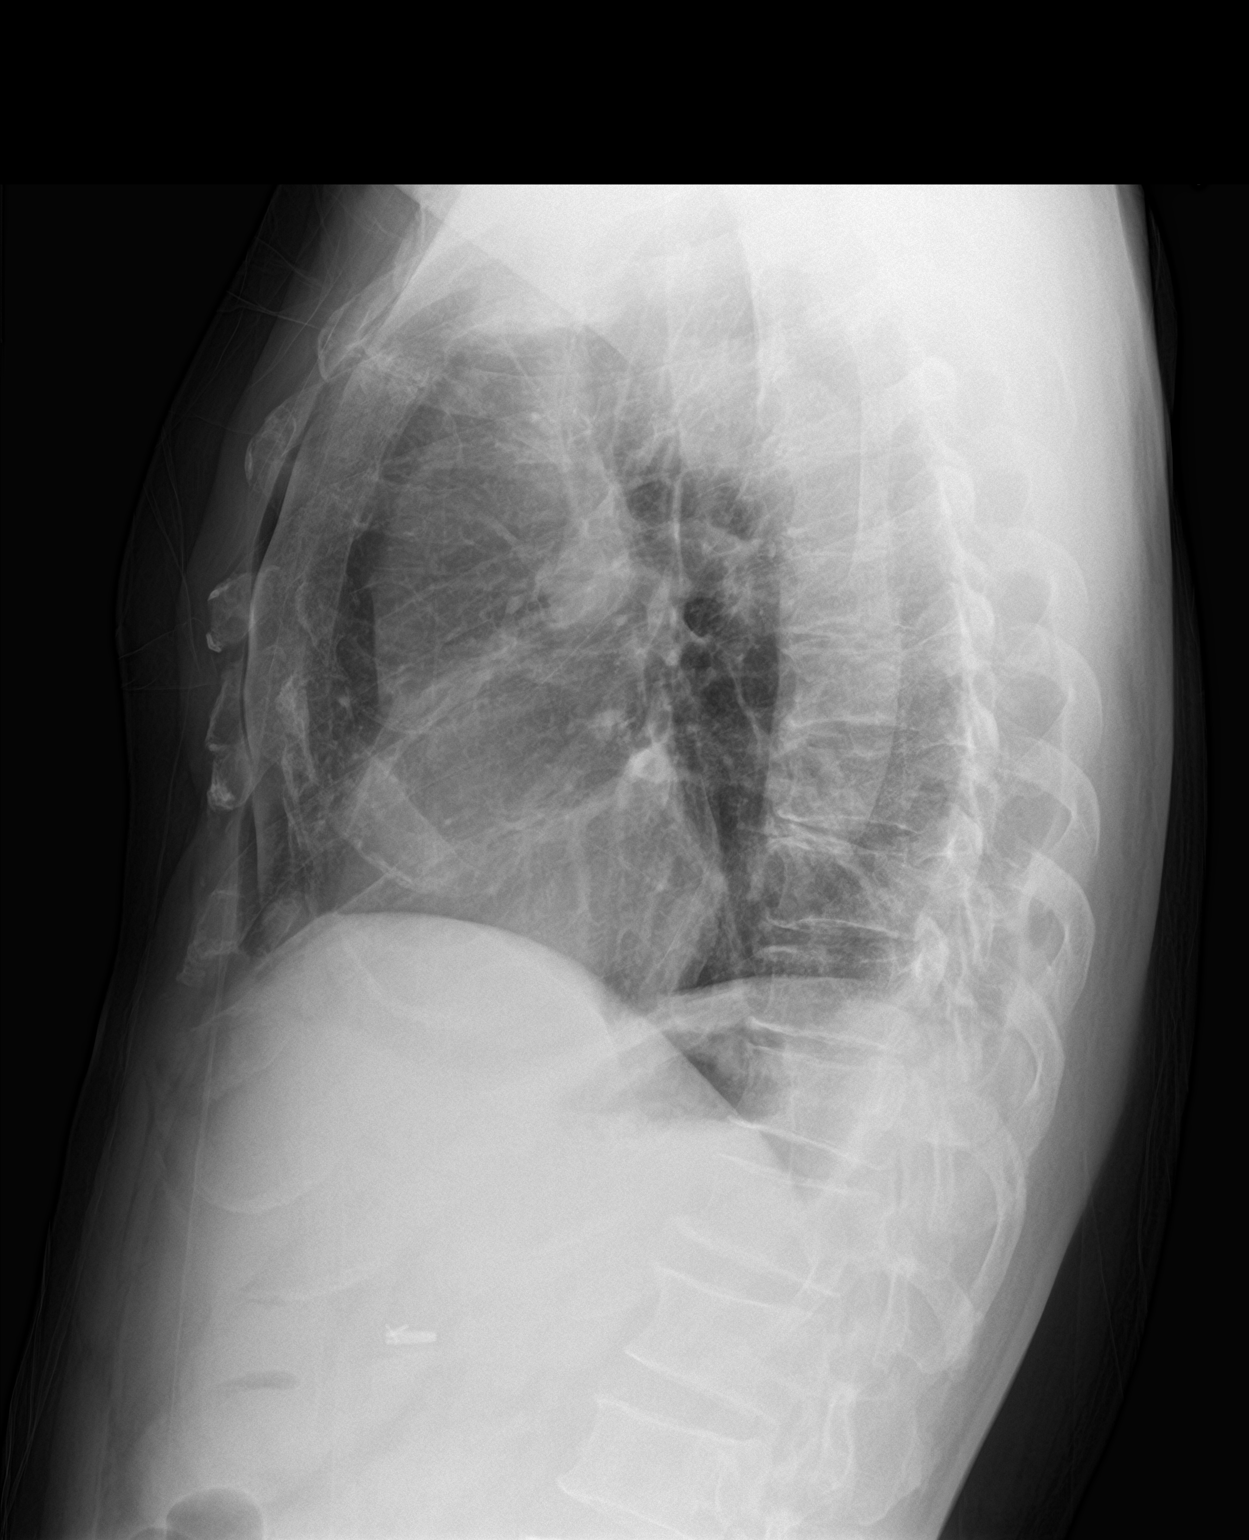

[2 of 2 positions shown; findings below may reference images not displayed]

FINDINGS: The right lung is well-expanded and clear. There are chronic
interstitial changes at the left lung base with lateral pleural
thickening. There is no alveolar infiltrate or pleural effusion. No
calcified pulmonary parenchymal nodules or lymph nodes are observed.
The heart and pulmonary vascularity are normal. The mediastinum is
normal in width.
IMPRESSION: There are no findings to suggest acute or old tuberculous infection.
There is stable scarring at the left lung base with adjacent pleural
thickening.

## 2018-07-11 IMAGING — CR DG CHEST 2V
2 series · 2 of 2 positions shown · non-contrast
Comparison: 06/24/2017, 12/05/2014

CLINICAL DATA: Mid chest pain

EXAM:
CHEST  2 VIEW

[w chest pa]
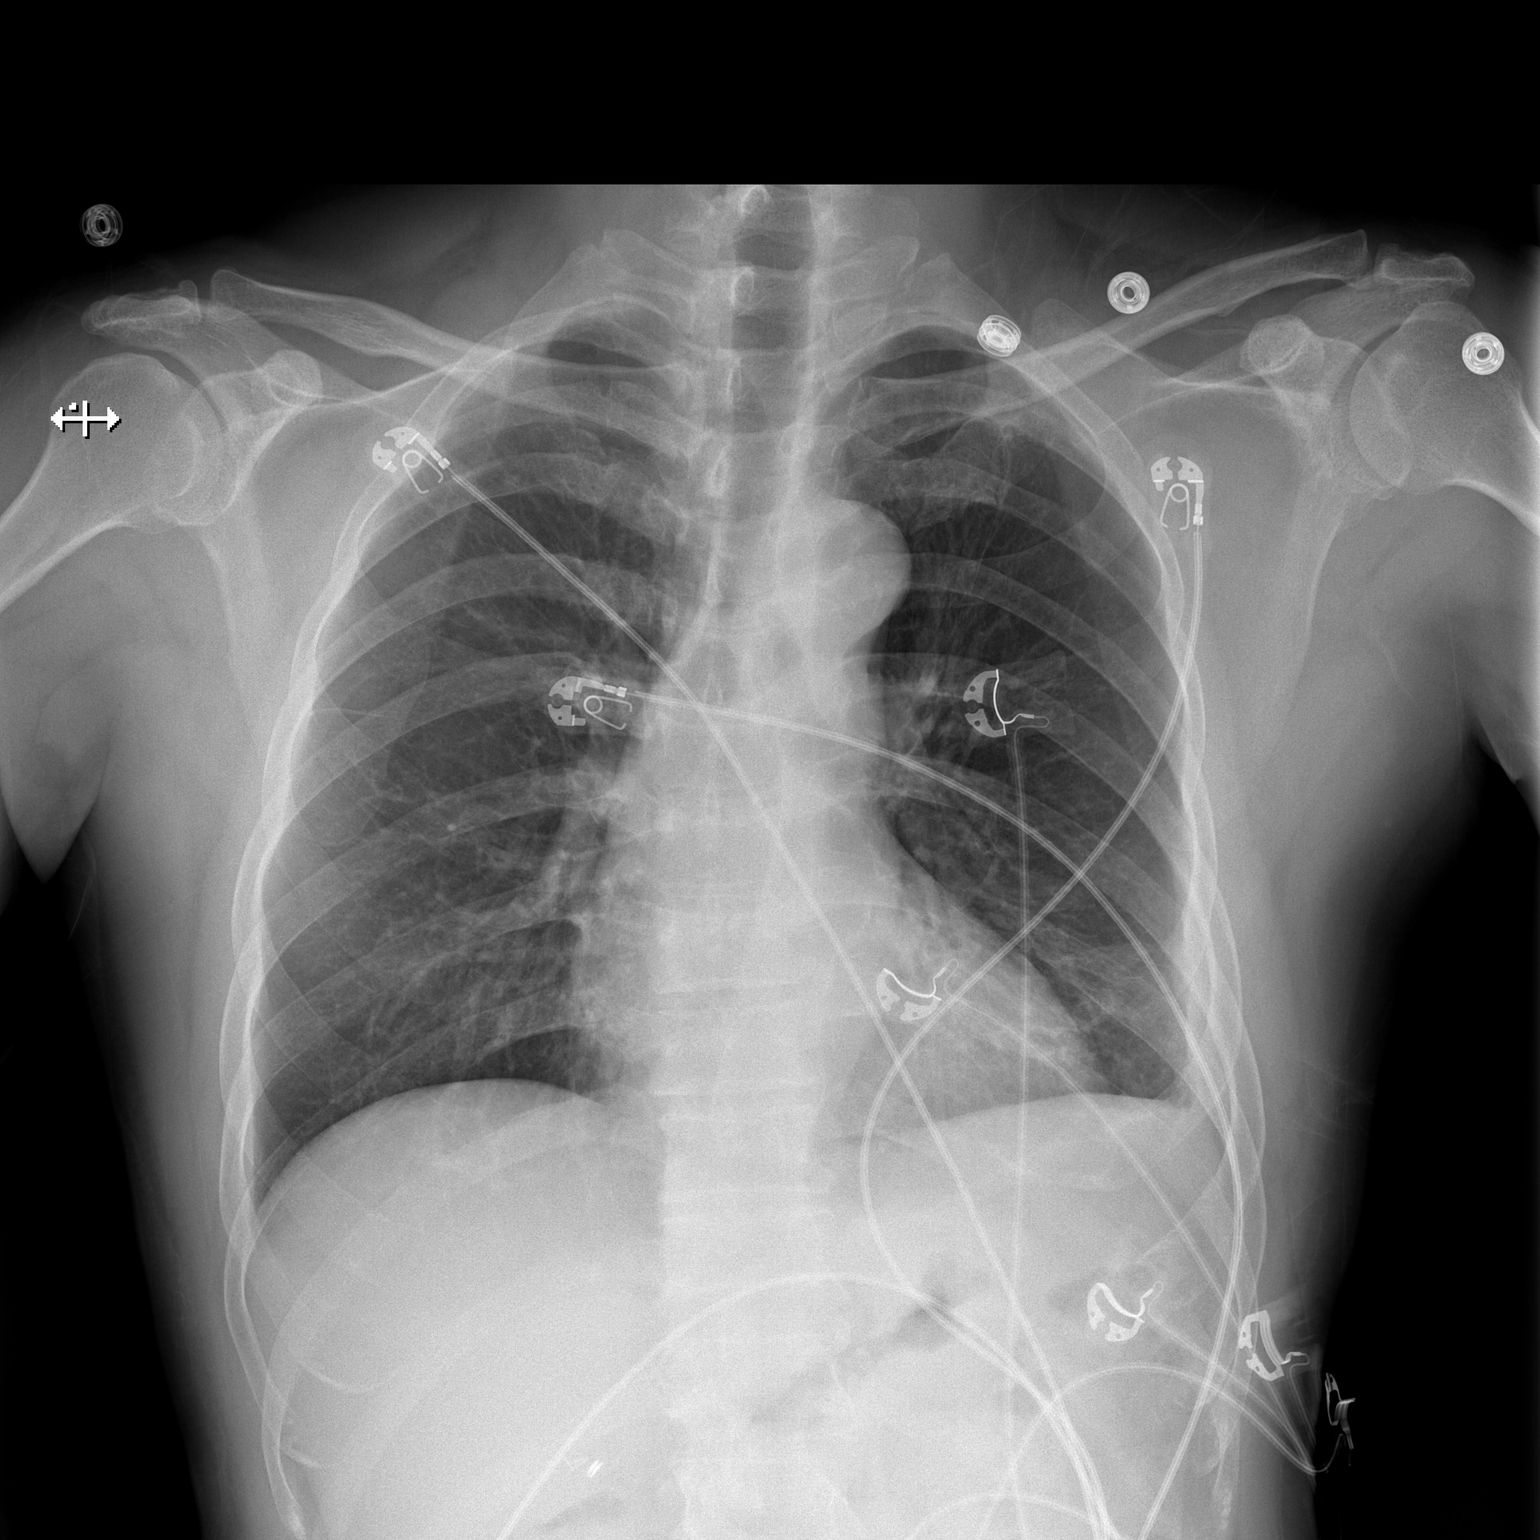

[w chest lat]
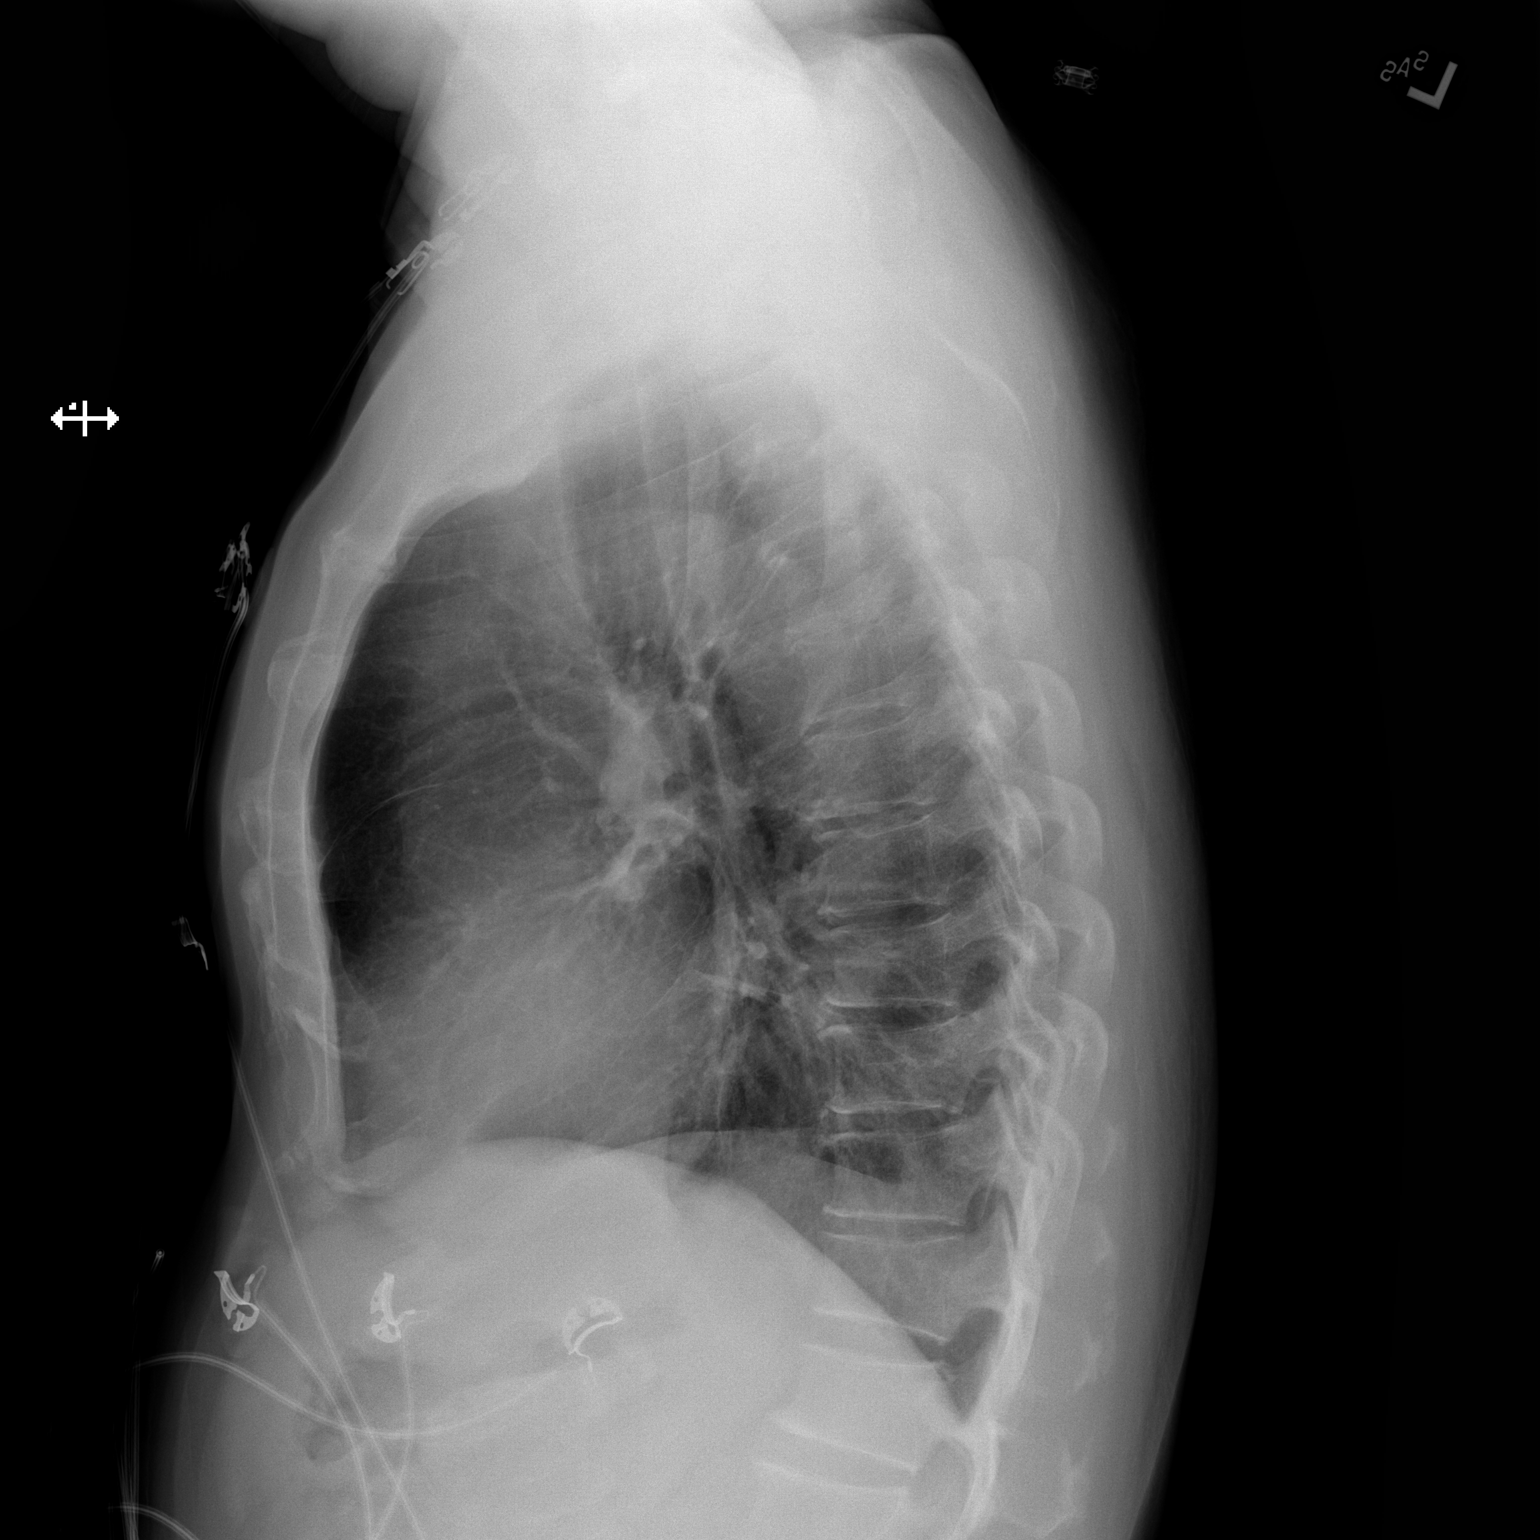

[2 of 2 positions shown; findings below may reference images not displayed]

FINDINGS: Stable pleural and parenchymal scarring at the left lung base. No
acute consolidation is seen. Stable cardiomediastinal silhouette. No
pneumothorax.
IMPRESSION: No active cardiopulmonary disease.

## 2018-09-30 ENCOUNTER — Ambulatory Visit: Payer: Self-pay | Admitting: General Surgery

## 2018-10-21 ENCOUNTER — Encounter (HOSPITAL_COMMUNITY)
Admission: RE | Disposition: A | Discharge status: Home / Self Care | Payer: Self-pay | Source: Ambulatory Visit | Attending: General Surgery

## 2018-10-21 ENCOUNTER — Ambulatory Visit (HOSPITAL_COMMUNITY)
Admission: RE | Admit: 2018-10-21 | Discharge: 2018-10-21 | Disposition: A | Discharge status: Home / Self Care | Payer: Medicaid Other | Source: Ambulatory Visit | Attending: General Surgery | Admitting: General Surgery

## 2018-10-21 ENCOUNTER — Other Ambulatory Visit: Payer: Self-pay

## 2018-10-21 ENCOUNTER — Encounter (HOSPITAL_COMMUNITY): Payer: Self-pay | Admitting: *Deleted

## 2018-10-21 DIAGNOSIS — D127 Benign neoplasm of rectosigmoid junction: Secondary | ICD-10-CM | POA: Insufficient documentation

## 2018-10-21 DIAGNOSIS — K5730 Diverticulosis of large intestine without perforation or abscess without bleeding: Secondary | ICD-10-CM | POA: Insufficient documentation

## 2018-10-21 DIAGNOSIS — Z86010 Personal history of colonic polyps: Secondary | ICD-10-CM | POA: Insufficient documentation

## 2018-10-21 DIAGNOSIS — Z7982 Long term (current) use of aspirin: Secondary | ICD-10-CM | POA: Insufficient documentation

## 2018-10-21 DIAGNOSIS — Z1211 Encounter for screening for malignant neoplasm of colon: Secondary | ICD-10-CM | POA: Insufficient documentation

## 2018-10-21 DIAGNOSIS — I10 Essential (primary) hypertension: Secondary | ICD-10-CM | POA: Insufficient documentation

## 2018-10-21 DIAGNOSIS — Z79899 Other long term (current) drug therapy: Secondary | ICD-10-CM | POA: Diagnosis not present

## 2018-10-21 DIAGNOSIS — Z87891 Personal history of nicotine dependence: Secondary | ICD-10-CM | POA: Insufficient documentation

## 2018-10-21 HISTORY — PX: BIOPSY: SHX5522

## 2018-10-21 HISTORY — PX: COLONOSCOPY: SHX5424

## 2018-10-21 SURGERY — COLONOSCOPY
Anesthesia: Moderate Sedation

## 2018-10-21 MED ORDER — LACTATED RINGERS IV SOLN: INTRAVENOUS | Status: DC

## 2018-10-21 MED ORDER — MIDAZOLAM HCL (PF) 5 MG/ML IJ SOLN
INTRAMUSCULAR | Status: AC
  Filled 2018-10-21: qty 2

## 2018-10-21 MED ORDER — MIDAZOLAM HCL (PF) 10 MG/2ML IJ SOLN
INTRAMUSCULAR | Status: DC | PRN
  Administered 2018-10-21 (×2): 1 mg via INTRAVENOUS
  Administered 2018-10-21: 2 mg via INTRAVENOUS

## 2018-10-21 MED ORDER — FENTANYL CITRATE (PF) 100 MCG/2ML IJ SOLN
INTRAMUSCULAR | Status: DC | PRN
  Administered 2018-10-21 (×2): 25 ug via INTRAVENOUS

## 2018-10-21 MED ORDER — FENTANYL CITRATE (PF) 100 MCG/2ML IJ SOLN
INTRAMUSCULAR | Status: AC
  Filled 2018-10-21: qty 2

## 2018-10-21 NOTE — Op Note (Signed)
[facility] Patient Name: Eduardo West Procedure Date: 10/21/2018 MRN: [id number] Attending MD: Leighton Ruff , MD Date of Birth: 1961-03-07 CSN: [id number] Age: 57 Admit Type: Outpatient Procedure:                Colonoscopy Indications:              High risk colon cancer surveillance: Personal                            history of colonic polyps Providers:                Leighton Ruff, MD, Cleda Daub, RN, Cletis Athens,                            Technician Referring MD:              Medicines:                Midazolam 4 mg IV, Fentanyl 50 micrograms IV Complications:            No immediate complications. Estimated blood loss:                            Minimal. Estimated Blood Loss:      Procedure:                Pre-Anesthesia Assessment:                           - Prior to the procedure, a History and Physical                            was performed, and patient medications and                            allergies were reviewed. The patient's tolerance of                            previous anesthesia was also reviewed. The risks                            and benefits of the procedure and the sedation                            options and risks were discussed with the patient.                            All questions were answered, and informed consent                            was obtained. Prior Anticoagulants: The patient has                            taken aspirin, last dose was 3 days prior to                            procedure. ASA  Grade Assessment: II - A patient                            with mild systemic disease. After reviewing the                            risks and benefits, the patient was deemed in                            satisfactory condition to undergo the procedure.                           After obtaining informed consent, the colonoscope                            was passed under direct vision. Throughout the               procedure, the patient's blood pressure, pulse, and                            oxygen saturations were monitored continuously. The                            [id number]-[id number] ([id number] Olympus peds colonscope was                            introduced through the anus and advanced to the the                            ileocolonic anastomosis. The colonoscopy was                            performed with ease. The patient tolerated the                            procedure well. The quality of the bowel                            preparation was adequate to identify polyps. Scope                            withdrawal time was 23 minutes. Scope In: 1:25:59 PM Scope Out: 1:53:22 PM Scope Withdrawal Time: 0 hours 22 minutes 49 seconds  Total Procedure Duration: 0 hours 27 minutes 23 seconds  Findings:      The perianal and digital rectal examinations were normal.      Scattered small-mouthed diverticula were found in the sigmoid colon,       descending colon and transverse colon.      A 3 mm polyp was found in the recto-sigmoid colon. The polyp was       sessile. The polyp was removed with a cold snare. Resection and       retrieval were complete. Estimated blood loss was minimal. Impression:               - Diverticulosis in the sigmoid colon,  in the                            descending colon and in the transverse colon.                           - One 3 mm polyp at the recto-sigmoid colon,                            removed with a cold snare. Resected and retrieved. Moderate Sedation:      Moderate (conscious) sedation was administered by the endoscopy nurse       and supervised by the endoscopist. The following parameters were       monitored: oxygen saturation, heart rate, blood pressure, and response       to care. Recommendation:           - Repeat colonoscopy for surveillance based on                            pathology results.                           - Discharge patient to  home (ambulatory).                           - Resume previous diet. Procedure Code(s):        --- Professional ---                           [phone], Colonoscopy, flexible; with removal of                            tumor(s), polyp(s), or other lesion(s) by snare                            technique Diagnosis Code(s):        --- Professional ---                           Z86.010, Personal history of colonic polyps                           D12.7, Benign neoplasm of rectosigmoid junction                           K57.30, Diverticulosis of large intestine without                            perforation or abscess without bleeding CPT copyright 2018 American Medical Association. All rights reserved. The codes documented in this report are preliminary and upon coder review may  be revised to meet current compliance requirements. Leighton Ruff, MD Leighton Ruff, MD 21/19/4174 2:00:56 PM This report has been signed electronically. Number of Addenda: 0

## 2018-10-21 NOTE — H&P (Addendum)
Eduardo West is an 57 y.o. male.   Chief Complaint: colonoscopy HPI: 57 y.o. M s/p colonoscopy with polyp removal 5 yrs ago.  Here for surveillance colonoscopy.  He is off of his aspirin and has completed his bowel prep.  Past Medical History:  Diagnosis Date  . Abdominal pain   . Buzzing in ear   . Chronic diarrhea 03/04/2012  . Colon polyp   . Diarrhea   . Diverticulitis of cecum 12/2009   Resected 2011:  COLON, SEGMENTAL RESECTION, RIGHT AND TERMINAL ILEUM : - SEGMENT OF COLON  . Hypertension   . Kidney stones   . Pleural effusion, left   . 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 Right leg numbness     Past Surgical History:  Procedure Laterality Date  . APPENDECTOMY  2003   Dr. Marlou Starks:  Attempted laparoscopic and subsequent open appendectomy  . CHOLECYSTECTOMY  12/2009   Dr. Excell Seltzer  . COLON SURGERY    . FLEXIBLE SIGMOIDOSCOPY N/A 09/13/2013   Procedure: FLEXIBLE SIGMOIDOSCOPY;  Surgeon: Leighton Ruff, MD;  Location: [facility];  Service: Endoscopy;  Laterality: N/A;  . RIGHT COLECTOMY  12/2009   Dr Excell Seltzer: open resection for cecal diverticulitis    Family History  Problem Relation Age of Onset  . Cancer Mother    Social History:  reports that he quit smoking about 10 years ago. He has a 10.00 pack-year smoking history. He has never used smokeless tobacco. He reports that he does not drink alcohol or use drugs.  Allergies: No Known Allergies  Medications Prior to Admission  Medication Sig Dispense Refill  . albuterol (PROVENTIL HFA;VENTOLIN HFA) 108 (90 Base) MCG/ACT inhaler Inhale 2 puffs into the lungs every 6 (six) hours as needed for wheezing or shortness of breath. 1 Inhaler 5  . aspirin EC 81 MG tablet Take 81 mg by mouth daily.    [name] Kitchen atorvastatin (LIPITOR) 40 MG tablet Take 1 tablet (40 mg total) by mouth daily. 90 tablet 3  . Chlorpheniramine-DM (CORICIDIN HBP COUGH/COLD PO) Take 1 tablet by mouth at bedtime as needed (sinuses).    . Cholecalciferol (VITAMIN D) 50 MCG (2000 UT)  tablet Take 2,000 Units by mouth daily.    . fesoterodine (TOVIAZ) 4 MG TB24 tablet Take 4 mg by mouth every evening.    . flecainide (TAMBOCOR) 100 MG tablet Take 1 tablet (100 mg total) by mouth 2 (two) times daily. 180 tablet 1  . ibuprofen (ADVIL,MOTRIN) 200 MG tablet Take 400 mg by mouth every 6 (six) hours as needed for headache or moderate pain.    [name] Kitchen lisinopril (PRINIVIL,ZESTRIL) 20 MG tablet TAKE 1 TABLET BY MOUTH EVERY DAY 90 tablet 3  . tamsulosin (FLOMAX) 0.4 MG CAPS capsule Take 0.4 mg by mouth every evening.   11  . Tiotropium Bromide Monohydrate (SPIRIVA RESPIMAT) 1.25 MCG/ACT AERS Inhale 2 puffs into the lungs daily. (Patient not taking: Reported on 10/20/2018) 1 Inhaler 5    No results found for this or any previous visit (from the past 48 hour(s)). No results found.  Review of Systems  Constitutional: Negative for chills and fever.  HENT: Negative for hearing loss.   Eyes: Negative for blurred vision.  Respiratory: Negative for cough and shortness of breath.   Cardiovascular: Negative for chest pain and palpitations.  Gastrointestinal: Negative for abdominal pain, nausea and vomiting.  Genitourinary: Negative for dysuria, frequency and urgency.  Neurological: Negative for dizziness and headaches.    Blood pressure 136/88, pulse 69, temperature 97.7 F (  36.5 C), temperature source Oral, resp. rate 18, height 5\' 3"  (1.6 m), weight 70 kg, SpO2 99 %. Physical Exam  Constitutional: He is oriented to person, place, and time. He appears well-developed and well-nourished.  HENT:  Head: Normocephalic and atraumatic.  Eyes: Pupils are equal, round, and reactive to light. Conjunctivae and EOM are normal.  Neck: Normal range of motion. Neck supple.  Cardiovascular: Normal rate and regular rhythm.  Respiratory: Effort normal. No respiratory distress.  GI: Soft. He exhibits no distension. There is no tenderness.  Musculoskeletal: Normal range of motion.  Neurological: He is  alert and oriented to person, place, and time.  Skin: Skin is warm and dry.     Assessment/Plan 57 y.o. M with h/o colon polyps and R hemi colectomy.  Here for surveillance colonoscopy.  Risks including bleeding, perforation, missed pathology and pain were discussed with the patient through an interpreter.  All questions were answered.    Eduardo Adie, MD 96/22/2979, 12:54 PM

## 2018-10-21 NOTE — Discharge Instructions (Signed)
Post Colonoscopy Instructions ° °1. DIET: Follow a light bland diet the first 24 hours after arrival home, such as soup, liquids, crackers, etc.  Be sure to include lots of fluids daily.  Avoid fast food or heavy meals as your are more likely to get nauseated.   °2. You may have some mild rectal bleeding for the first few days after the procedure.  This should get less and less with time.  Resume any blood thinners 2 days after your procedure unless directed otherwise by your physician. °3. Take your usually prescribed home medications unless otherwise directed. °a. If you have any pain, it is helpful to get up and walk around, as it is usually from excess gas. °b. If this is not helpful, you can take an over-the-counter pain medication.  Choose one of the following that works best for you: °i. Naproxen (Aleve, etc)  Two 220mg tabs twice a day °ii. Ibuprofen (Advil, etc) Three 200mg tabs four times a day (every meal & bedtime) °iii. If you still have pain after using one of these, please call the office °4. It is normal to not have a bowel movement for 2-3 days after colonoscopy.   ° °5. ACTIVITIES as tolerated:   °6. You may resume regular (light) daily activities beginning the next day--such as daily self-care, walking, climbing stairs--gradually increasing activities as tolerated.  ° ° °WHEN TO CALL US [phone]: °1. Fever over 101.5 F (38.5 C)  °2. Severe abdominal or chest pain  °3. Large amount of rectal bleeding, passing multiple blood clots  °4. Dizziness or shortness of breath °5. Increasing nausea or vomiting ° ° The clinic staff is available to answer your questions during regular business hours (8:30am-5pm).  Please don’t hesitate to call and ask to speak to one of our nurses for clinical concerns.  ° If you have a medical emergency, go to the nearest emergency room or call 911. ° A surgeon from [facility] is always on call at the hospitals ° ° °[facility] °[address], [address], [place], [place]  [address] ? °MAIN: [phone] ? TOLL FREE: [phone] ?  °FAX [phone] °[email or web address] ° ° °

## 2018-10-24 ENCOUNTER — Encounter (HOSPITAL_COMMUNITY): Payer: Self-pay | Admitting: General Surgery

## 2018-10-25 ENCOUNTER — Other Ambulatory Visit: Payer: Self-pay | Admitting: Internal Medicine

## 2018-10-25 DIAGNOSIS — E7800 Pure hypercholesterolemia, unspecified: Secondary | ICD-10-CM

## 2018-10-25 NOTE — Telephone Encounter (Signed)
New message     *STAT* If patient is at the pharmacy, call can be transferred to refill team.   1. Which medications need to be refilled? (please list name of each medication and dose if known) atorvastatin 40 mg  2. Which pharmacy/location (including street and city if local pharmacy) is medication to be sent to? [facility] on [address].  3. Do they need a 30 day or 90 day supply? 90 day

## 2018-10-25 NOTE — Telephone Encounter (Signed)
Rx(s) sent to pharmacy electronically.  

## 2018-11-10 ENCOUNTER — Ambulatory Visit: Payer: Medicaid Other | Admitting: Internal Medicine

## 2018-11-11 ENCOUNTER — Ambulatory Visit: Payer: BLUE CROSS/BLUE SHIELD | Admitting: Internal Medicine

## 2018-11-11 ENCOUNTER — Encounter: Payer: Self-pay | Admitting: Internal Medicine

## 2018-11-11 ENCOUNTER — Ambulatory Visit: Payer: Medicaid Other | Admitting: Internal Medicine

## 2018-11-11 VITALS — BP 148/98 | HR 78 | Ht 63.0 in | Wt 157.6 lb

## 2018-11-11 DIAGNOSIS — Z79899 Other long term (current) drug therapy: Secondary | ICD-10-CM | POA: Diagnosis not present

## 2018-11-11 DIAGNOSIS — E7800 Pure hypercholesterolemia, unspecified: Secondary | ICD-10-CM | POA: Diagnosis not present

## 2018-11-11 DIAGNOSIS — Z5181 Encounter for therapeutic drug level monitoring: Secondary | ICD-10-CM

## 2018-11-11 DIAGNOSIS — I493 Ventricular premature depolarization: Secondary | ICD-10-CM

## 2018-11-11 MED ORDER — FLECAINIDE ACETATE 100 MG PO TABS: 100.0000 mg | ORAL_TABLET | Freq: Two times a day (BID) | ORAL | 3 refills | Status: DC

## 2018-11-11 MED ORDER — HYDROCHLOROTHIAZIDE 12.5 MG PO CAPS: 12.5000 mg | ORAL_CAPSULE | Freq: Every day | ORAL | 3 refills | Status: DC

## 2018-11-11 MED ORDER — ATORVASTATIN CALCIUM 40 MG PO TABS: ORAL_TABLET | ORAL | 3 refills | Status: DC

## 2018-11-11 NOTE — Progress Notes (Signed)
OFFICE NOTE  Chief Complaint:  Follow-up blood pressure  Primary Care Physician: Rogers Blocker, MD  HPI:  Eduardo West is a pleasant 57 year old [name] he is male who is seen today with a Optometrist. He is referred by Dr. Jimmye Norman for evaluation of an abnormal heart rhythm. EKG today shows ventricular bigeminy with ST and T wave abnormalities laterally concerning for ischemia. [name] reports some increasing chest discomfort with exertion as well as shortness of breath. He says that he typically walks 30-45 minutes almost every day but recently has had worsening shortness of breath and his fatigue after only 5-10 minutes. He says that his chest discomfort is worse when exerting himself and released somewhat by rest. There is no significant family history of heart disease per his report although hypertension is his family. He was recently hospitalized for small bowel obstruction and has improved. He has had partial colon resection as well as appendectomy in the past. In January 2015 while hospitalized he was seen for chest pain by Dr. Pernell Dupre. Echocardiogram was recommended which showed an EF of 60-65% with mild diastolic dysfunction and moderate concentric LVH. There were no segmental wall motion abnormalities. He reports his symptoms have progressively gotten worse since his recent hospitalization.  [name] returns for follow-up. At his last office visit I started him on low-dose beta blocker and he remains in ventricular bigeminy today. The electrical rate is 68 however his pulse rate is half of that at 34. He cannot express whether he has more fatigue or chest discomfort on the medicine than he had before. He certainly does not feel in any improvement on the beta blocker. Pulse rate may be slower.  I had the pleasure seeing [name] back in the office today. In the interim he seen Dr. Cristopher Peru twice, who started him on flecainide and increased dose to 100 mg twice a day. This  seems to have been effective in suppressing his PVCs. He does feel somewhat better and has more energy and is less short of breath.  I saw [name] back in the office today with an interpreter. Overall he is doing fairly well. He is currently on flecainide 100 mg twice a day. He reports nearly complete resolution of his palpitations. His EKG shows no evidence of bigeminy. He says he gets occasional sharp chest pain but no significant symptoms that sound anginal. He also has some mild shortness of breath. He underwent a stress test last year which was negative for ischemia. Blood pressure appears well controlled on Dyazide.  10/28/2016  [name] returns today for follow-up. He is accompanied by a [place] interpreter. Overall he feels well. He denies any significant palpitations. He has been on flecainide 100 mg twice a day. QTc interval is 467 ms today with EKG demonstrating sinus rhythm and first-degree AV block at 212 ms. He recently was in the hospital for abdominal pain and was given some hydrocodone with improvement. The etiology was unclear. He's not had any significant palpitations. He denies any chest pain. He had a stress test in April of this year which was nonischemic. He will need another stress test in 2 years for monitoring of flecainide therapy.  08/03/2017  [name] returns today for follow-up. Recently he was seen by Almyra Deforest, PA-C for episodes of chest pain at rest and with exertion. Amlodipine was added for antianginal benefit however he says that he feels dizzy on this medication. Blood pressure is low normal today at  112/88. I suspect he may be getting hypotensive with it. He does not feel that it's improved his chest discomfort. It is also notable that he is not taking aspirin daily. He was supposed to be on this medicine. I went over this with him in the interpreter today and provided samples of 81 mg aspirin. He should take this daily. This is a treatment for coronary  disease.  11/11/2017  Mr. Yepiz returns today for follow-up.  He is accompanied by an interpreter.  When I last saw him we readjusted his medications, namely we discontinued his amlodipine.  He was having problems with swelling and he had no improvement in his chest discomfort.  At times he was also having some hypotension.  Blood pressure was retested today and was initially elevated 152/90 however recheck came back at 124/80 in the left arm and 130/88 in the right arm.  He remains on flecainide and has had no recurrent atrial fibrillation.  EKG today shows sinus rhythm with a QTC of 425 ms.  11/11/2018  Mr. Maisano is seen today in follow-up with the [place] translator.  Symptomatically he is doing well.  Blood pressure seems to be still elevated.  He denies any palpitations and is tolerating his flecainide.  His last stress test was 2 years ago.  EKG shows sinus rhythm with first-degree AV block and QTC of 360 ms.  He denies chest pain or worsening shortness of breath.  PMHx:  Past Medical History:  Diagnosis Date  . Abdominal pain   . Buzzing in ear   . Chronic diarrhea 03/04/2012  . Colon polyp   . Diarrhea   . Diverticulitis of cecum 12/2009   Resected 2011:  COLON, SEGMENTAL RESECTION, RIGHT AND TERMINAL ILEUM : - SEGMENT OF COLON  . Hypertension   . Kidney stones   . Pleural effusion, left   . 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 Right leg numbness     Past Surgical History:  Procedure Laterality Date  . APPENDECTOMY  2003   Dr. Marlou Starks:  Attempted laparoscopic and subsequent open appendectomy  . BIOPSY  10/21/2018   Procedure: BIOPSY;  Surgeon: Leighton Ruff, MD;  Location: [facility];  Service: Endoscopy;;  . CHOLECYSTECTOMY  12/2009   Dr. Excell Seltzer  . COLON SURGERY    . COLONOSCOPY N/A 10/21/2018   Procedure: COLONOSCOPY ERAS PATHWAY;  Surgeon: Leighton Ruff, MD;  Location: [facility];  Service: Endoscopy;  Laterality: N/A;  . FLEXIBLE SIGMOIDOSCOPY N/A 09/13/2013   Procedure:  FLEXIBLE SIGMOIDOSCOPY;  Surgeon: Leighton Ruff, MD;  Location: [facility];  Service: Endoscopy;  Laterality: N/A;  . RIGHT COLECTOMY  12/2009   Dr Excell Seltzer: open resection for cecal diverticulitis    FAMHx:  Family History  Problem Relation Age of Onset  . Cancer Mother     SOCHx:   reports that he quit smoking about 10 years ago. He has a 10.00 pack-year smoking history. He has never used smokeless tobacco. He reports that he does not drink alcohol or use drugs.  ALLERGIES:  No Known Allergies  ROS: Pertinent items noted in HPI and remainder of comprehensive ROS otherwise negative.  HOME MEDS: Current Outpatient Medications  Medication Sig Dispense Refill  . albuterol (PROVENTIL HFA;VENTOLIN HFA) 108 (90 Base) MCG/ACT inhaler Inhale 2 puffs into the lungs every 6 (six) hours as needed for wheezing or shortness of breath. 1 Inhaler 5  . aspirin EC 81 MG tablet Take 81 mg by mouth daily.    [name] Kitchen atorvastatin (LIPITOR) 40  MG tablet TAKE 1 TABLET(40 MG) BY MOUTH DAILY 90 tablet 0  . Chlorpheniramine-DM (CORICIDIN HBP COUGH/COLD PO) Take 1 tablet by mouth at bedtime as needed (sinuses).    . Cholecalciferol (VITAMIN D) 50 MCG (2000 UT) tablet Take 2,000 Units by mouth daily.    . fesoterodine (TOVIAZ) 4 MG TB24 tablet Take 4 mg by mouth every evening.    . flecainide (TAMBOCOR) 100 MG tablet Take 1 tablet (100 mg total) by mouth 2 (two) times daily. 180 tablet 1  . ibuprofen (ADVIL,MOTRIN) 200 MG tablet Take 400 mg by mouth every 6 (six) hours as needed for headache or moderate pain.    [name] Kitchen lisinopril (PRINIVIL,ZESTRIL) 20 MG tablet TAKE 1 TABLET BY MOUTH EVERY DAY 90 tablet 3  . tamsulosin (FLOMAX) 0.4 MG CAPS capsule Take 0.4 mg by mouth every evening.   11  . Tiotropium Bromide Monohydrate (SPIRIVA RESPIMAT) 1.25 MCG/ACT AERS Inhale 2 puffs into the lungs daily. 1 Inhaler 5   No current facility-administered medications for this visit.     LABS/IMAGING: No results found for this or  any previous visit (from the past 48 hour(s)). No results found.  VITALS: BP (!) 148/98   Pulse 78   Ht 5\' 3"  (1.6 m)   Wt 157 lb 9.6 oz (71.5 kg)   BMI 27.92 kg/m   EXAM: General appearance: alert and no distress Neck: no carotid bruit and no JVD Lungs: clear to auscultation bilaterally Heart: regular rate and rhythm, S1, S2 normal, no murmur, click, rub or gallop Abdomen: soft, non-tender; bowel sounds normal; no masses,  no organomegaly Extremities: extremities normal, atraumatic, no cyanosis or edema Pulses: 2+ and symmetric Skin: Skin color, texture, turgor normal. No rashes or lesions Neurologic: Grossly normal Psych: Pleasant  EKG: Sinus rhythm first-degree AV block, nonspecific T wave changes-QTc 360 ms, personally reviewed  ASSESSMENT: 1. Ventricular bigeminy with nonconducted PVCs - suppressed on flecainide 2. Chest pain with exertion - low risk nuclear stress test (2017) 3. Dyspnea on exertion - improved 4. Hypertension 5. Aortic atherosclerosis  PLAN: 1.   Mr. Lipford is noted to have slightly elevated blood pressure today.  He was taken off of amlodipine due to lower blood pressure for which he was symptomatic.  As he is on flecainide, he will need repeat stress testing and I recommend a treadmill exercise stress test.  He is also been experiencing some lower extremity edema and due to his mildly increased blood pressure, will start hydrochlorothiazide 12.5 mg daily.  We will repeat a metabolic profile and fasting lipid profile in about a week.  Plan follow-up with me after the new year to go over stress test and lab results and recheck his blood pressure.  Pixie Casino, MD, [facility], [place] Director of the [facility]id [facility]  Card[facility] Attending Cardiologist  Direct Dial: [phone]  Fax: [phone]  Website:  [email or web address] 11/11/2018, 1:38 PM

## 2018-11-11 NOTE — Patient Instructions (Signed)
Medication Instructions:  START hydrochlorothiazide (hctz) 12.5mg  daily If you need a refill on your cardiac medications before your next appointment, please call your pharmacy.   Lab work: FASTING lab work 12/20-12/24 - BMET, LIPID panel If you have labs (blood work) drawn today and your tests are completely normal, you will receive your results only by: [name] Kitchen MyChart Message (if you have MyChart) OR . A paper copy in the mail If you have any lab test that is abnormal or we need to change your treatment, we will call you to review the results.  Testing/Procedures: Your physician has requested that you have an exercise tolerance test. This is done at Dr. Lysbeth Penner office.  Follow-Up: At [facility], you and your health needs are our priority.  As part of our continuing mission to provide you with exceptional heart care, we have created designated Provider Care Teams.  These Care Teams include your primary Cardiologist (physician) and Advanced Practice Providers (APPs -  Physician Assistants and Nurse Practitioners) who all work together to provide you with the care you need, when you need it. You will need a follow up appointment in 4 weeks. You may see Dr. Debara Pickett or one of the following Advanced Practice Providers on your designated Care Team: Almyra Deforest, [place] . Fabian Sharp, PA-C  Any Other Special Instructions Will Be Listed Below (If Applicable).

## 2018-11-14 ENCOUNTER — Encounter: Payer: Self-pay | Admitting: Internal Medicine

## 2018-11-18 LAB — BASIC METABOLIC PANEL
BUN / CREAT RATIO: 9 (ref 9–20)
BUN: 11 mg/dL (ref 6–24)
CHLORIDE: 100 mmol/L (ref 96–106)
CO2: 26 mmol/L (ref 20–29)
Calcium: 9.2 mg/dL (ref 8.7–10.2)
Creatinine, Ser: 1.16 mg/dL (ref 0.76–1.27)
GFR calc Af Amer: 81 mL/min/{1.73_m2} (ref >59)
GFR calc non Af Amer: 70 mL/min/{1.73_m2} (ref >59)
GLUCOSE: 94 mg/dL (ref 65–99)
Potassium: 3.7 mmol/L (ref 3.5–5.2)
SODIUM: 139 mmol/L (ref 134–144)

## 2018-11-18 LAB — LIPID PANEL
CHOLESTEROL TOTAL: 100 mg/dL (ref 100–199)
Chol/HDL Ratio: 1.9 ratio (ref 0.0–5.0)
HDL: 53 mg/dL (ref >39)
LDL Calculated: 33 mg/dL (ref 0–99)
Triglycerides: 72 mg/dL (ref 0–149)
VLDL Cholesterol Cal: 14 mg/dL (ref 5–40)

## 2018-11-21 ENCOUNTER — Encounter: Payer: Self-pay | Admitting: Internal Medicine

## 2018-11-29 ENCOUNTER — Telehealth (HOSPITAL_COMMUNITY): Payer: Self-pay

## 2018-11-29 NOTE — Telephone Encounter (Signed)
Encounter complete. 

## 2018-12-01 ENCOUNTER — Telehealth (HOSPITAL_COMMUNITY): Payer: Self-pay

## 2018-12-01 NOTE — Telephone Encounter (Signed)
Encounter complete. 

## 2018-12-02 ENCOUNTER — Encounter (HOSPITAL_COMMUNITY): Payer: Self-pay | Admitting: *Deleted

## 2018-12-02 ENCOUNTER — Ambulatory Visit (HOSPITAL_COMMUNITY)
Admission: RE | Admit: 2018-12-02 | Discharge: 2018-12-02 | Disposition: A | Discharge status: Home / Self Care | Payer: BLUE CROSS/BLUE SHIELD | Source: Ambulatory Visit | Attending: Cardiovascular Disease | Admitting: Cardiovascular Disease

## 2018-12-02 ENCOUNTER — Encounter (INDEPENDENT_AMBULATORY_CARE_PROVIDER_SITE_OTHER): Payer: Self-pay

## 2018-12-02 DIAGNOSIS — Z79899 Other long term (current) drug therapy: Secondary | ICD-10-CM

## 2018-12-02 DIAGNOSIS — Z5181 Encounter for therapeutic drug level monitoring: Secondary | ICD-10-CM | POA: Diagnosis present

## 2018-12-02 LAB — EXERCISE TOLERANCE TEST
CHL RATE OF PERCEIVED EXERTION: 19
Estimated workload: 4.6 METS
Exercise duration (min): 2 min
Exercise duration (sec): 45 s
MPHR: 165 {beats}/min
Peak HR: 104 {beats}/min
Percent HR: 63 %
Rest HR: 73 {beats}/min

## 2018-12-02 NOTE — Progress Notes (Signed)
Interpreter present- Lindi Adie, from [employer].

## 2018-12-14 ENCOUNTER — Ambulatory Visit: Payer: BLUE CROSS/BLUE SHIELD | Admitting: Internal Medicine

## 2018-12-14 ENCOUNTER — Encounter: Payer: Self-pay | Admitting: Internal Medicine

## 2018-12-14 VITALS — BP 120/66 | HR 74 | Ht 63.0 in | Wt 155.0 lb

## 2018-12-14 DIAGNOSIS — R0600 Dyspnea, unspecified: Secondary | ICD-10-CM | POA: Diagnosis not present

## 2018-12-14 DIAGNOSIS — Z789 Other specified health status: Secondary | ICD-10-CM | POA: Diagnosis not present

## 2018-12-14 NOTE — Progress Notes (Signed)
Primary =  [facility] [address]  58 yo [place]et[place] male quit smoking 2010 extremely difficult hx due to language barrier, even thru fm member serving as interpreter   June 06, 2010 1st pulmonary office eval for large L effusion and co fatigue and sob x months. eval by [facility] with abd ct showing large effusion. minimal L chest discomfort, wt loss ? amt. no cough or leg swelling. no fever or sweats rec tap - 600 cc serous yellow exudate 94% L> mature lymphocytes, rec flow cytometry if recurs.   July 24, 2010 Followup on thoracentesis results. Pt states still has some chest discomfort and tightness- worse with inspiration. He denies SOB or cough. Appetite is poor. cxr no real change, rec return in 3 weeks   August 22, 2010 3 wk followup with cxr. Pt c/o increased SOB- esp at in the early evening. Pt states that he is SOB without any exertion at all assoc with centralized cp with coughing only. effusion did not worsen vs the pots tap previous xrary therefore rec Omeprazole 20 mg Take one 30-60 min before first and last meals of the day  GERD diet   September 11, 2010 cc Dyspnea- the same, no better or worse. not losing any weight. no cough now. afraid to eat but not loosing any wt. rec Continue omeprazole 20mg  Take one 30-60 min before first and last meals of the day   October 09, 2010 ov cp all the time but worse with deep breath and sob, not eating. rec conservative f/u   November 05, 2010 ov no change doe/ cp on ppi. December 08, 2010 --Presents for an acute office visit.Complains of SOB especially at night, some heaviness in chest, occ dry cough. Comes and goes. Was seen by PCP last week and sent for xray which showed no change in left effusion. He cont to feel bad w/no energy.Cough persists w/ intermittent fevers, pain in left rib area. Interpreter from [place]. rec IPPD   December 10, 2010 seen by Dr Joya Gaskins  PPD   positive. Has chronic L effusion. No family exposure.  Came to [place] 1992. Was pos on PPD in the past took 55months INH, 1992  Now with chronic effusion. Pt cont to have dry cough and weakness. Pt has intermittent fever. Pt cont to feel very bad.  2-3 months ago had fever and sweating. > health dept eval   December 22, 2010 ov no apparent change in any symptoms x tired from being on tb rx per Health dept. no convincing NS, wt loss, no change on serial cxrs since his orginal tap. rec f/u per HD and return here when released    05/06/2011 ov/[name] discharged by Health dept and  no change at all in chronic chest and abd pain and doe.  rec no change rx, f/u q 3 months until gets primary doctor to follow.       02/04/2012 f/u ov/[name] cc new onset bilateral mid back pain positional, better supine first noted over a year ago. Breathing ok and no cough. Overall pain pattern waxes and wanes but at present is no where near as severe as before. No limiting sob. Never found anything that makes it better. rec rx as IBS, no pulmonary f/u needed.    04/04/2012 f/u ov/[name] cc no better bilateral positional mid back pain, no assoc cough or sob. Not clear he tried any of the measures rec to date nor has a primary doctor. rec  Please remember to go to the x-ray > no change  Return to Dr Jimmye Norman, your primary for further evaluation of your back pain   08/23/2012 f/u ov/[name] cc "no better" cc feeling tired, doe x 50 ft  rec F/u prn    12/05/2014 f/u ov/[name] re: continues with same chronic complaints ? Worse x 2 m Chief Complaint  Patient presents with  . Acute Visit    Pt c/o fatigue and SOB for the past 2-3 months. He c/o nasal congestion at night.        No obvious day to day or daytime variabilty or assoc chronic cough or cp or chest tightness, subjective wheeze overt sinus or hb symptoms. No unusual exp hx or h/o childhood pna/ asthma or knowledge of premature birth.  Sleeping ok without nocturnal  or early am exacerbation  of respiratory  c/o's or need for  noct saba. Also denies any obvious fluctuation of symptoms with weather or environmental changes or other aggravating or alleviating factors except as outlined above  IOV 10/30/2016  Chief Complaint  Patient presents with  . Pulmonary Consult    Pt. is here to follow up to see if he still has fuild in his lungs, Pt. did have a procedure where he had fuild removed from his lungs. Pt. has some trouble breathing mainly at night, Pt. denies chest pain,coughing    58 year old [place] male. He does not speak any [place]. History is gained from interpreter [name] via [employer] . She came on web based service. Patient is an extremely poor historian. As best as I can gather he is had insidious onset of dyspnea for a year. It appears to be a recurrence. It is progressive. It is exertional and episodic and random. There is no associated orthopnea. There is no associated cough with a might be intermittent wheezing. This no clear cut aggravating or relieving factors. It is progressive and moderate severity. He feels that his pleural effusion is back and he is insisting on having a CT scan. And if the effusion is back he wants to have a thoracentesis. Apparently the physician did this from a few years ago and he felt better. 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   reports that he quit smoking about 8 years ago. He has a 10.00 pack-year smoking history. He has never used smokeless tobacco.   OV 02/19/2017  Chief Complaint  Patient presents with  . Follow-up    Pt here after CT chest. Pt states his breathing is doinb better, Pt c/o sinus congestion x week, yellow sinus mucus. Pt also c/o intermittent chest pain, midsternal.      [name] prsents for  followup of dyspnea. In inteim he finally had his sCT chest after insurance delays. No ILD. No emphysema. No tumor. Today the STRATUS machine died for interpretation and his english is actually undestandable. He tells me that the dyspnea is better. However he has 8 weeks of chronic sinusitis,  nasal blockage, runny nose and cough he has been treated with local inhalers but not with antibiotics and steroids according to his history.   EXAM: CT CHEST WITHOUT CONTRAST  TECHNIQUE: Multidetector CT imaging of the chest was performed following the standard protocol without intravenous contrast. High resolution imaging of the lungs,  as well as inspiratory and expiratory imaging, was performed.  COMPARISON:  CT abdomen pelvis 08/20/2016.  FINDINGS: Cardiovascular: Atherosclerotic calcification of the arterial vasculature. Heart size normal. No pericardial effusion.  Mediastinum/Nodes: No pathologically enlarged mediastinal or axillary lymph nodes. Hilar regions are difficult to evaluate without IV contrast. Esophagus is grossly unremarkable.  Lungs/Pleura: Minimal biapical pleuroparenchymal scarring. Additional pleural-parenchymal scarring at the base of the left hemithorax. No subpleural reticulation, traction bronchiectasis/bronchiolectasis, ground-glass or architectural distortion. Expiratory phase imaging is not in true expiration, limiting evaluation. No pleural fluid. Airway is unremarkable.  Upper Abdomen: Low-attenuation lesions in the liver measure up to 3.2 cm and are likely cysts although definitive characterization is limited due to size and/or lack of post-contrast imaging. Cholecystectomy. Visualized portions of the adrenal glands and right kidney are unremarkable. Stones are seen in the left kidney. Visualized portions of the spleen, pancreas, stomach and bowel are grossly unremarkable.  Musculoskeletal: No worrisome lytic or sclerotic lesions. Mild degenerative  changes are seen in the spine.  IMPRESSION: 1. No evidence of interstitial lung disease. Biapical and left basilar pleural parenchymal scarring. 2.  Aortic atherosclerosis (ICD10-170.0). 3. Left renal stones.   Electronically Signed   By: Lorin Picket M.D.   On: 01/13/2017 12:39    06/24/2017 2-3 month follow up. This visit was conducted with the use of the patient's daughter as interpreter. The patient speaks little to no [place]. Patient was originally seen by Dr. Chase Caller  on 02/19/2017 for progressive dyspnea over about a year. He had a pleural effusion in the past, and felt that it had recurred based on his symptoms. CT was done, but showed no evidence of interstitial lung disease, or pleural  effusion. Biapical and left basilar pleural parenchymal scarring was noted. He was treated with a prednisone taper, and doxycycline 100 mg twice a day 7 days. He states he completed both of these medications in March. Through the interpreter he specifies he did not feel that he had significant improvement after treatment with the doxycycline and a prednisone taper. Pt. Is here for follow up. He states he is doing well. He states his shortness of breath is the same. He states his dyspnea is worse when he lays down. He is in no distress.  His weight has been stable. He denies cough or any sputum production. He denies wheezing. He denies chest pain, fever, orthopnea, or hemoptysis.    07/26/2017  Follow up for dyspnea. Pt. Presents for follow up after being seen 06/24/2017 for dyspnea. He was seen with assistance of interpreter as he speaks [place] as his primary language. He states he still has some shortness of breath with exertion.He does not appear distressed in the office today. He denies cough, or increased sputum production. Oxygen saturations after ambulating to the room are 98% on room air. He denies fever, chest pain, orthopnea or hemoptysis. We discussed the results of his  PFTs.   Test Results: PFT's:07/26/2017 Mild obstruction   CT Chest>> 01/13/2017  No evidence of interstitial lung disease. Biapical and left basilar pleural parenchymal scarring. Aortic atherosclerosis (ICD10-170.0). Left renal stones.  CXR 06/24/2017 There are no findings to suggest acute or old tuberculous infection. There is stable scarring at the left lung base with adjacent pleural thickening.   Echo>> 12/26/2013 EF 89-21% Grade 1 diastolic dysfunction   OV 10/26/2017  Chief Complaint  Patient presents with  . Follow-up    Pt states that he feels about the same as last visit. Has complaints of SOB  with exertion.     58 year old [place] male who speaks no [place].  History is always given by an interpreter.  Even with the interpreter he is a poor historian.  He presents again for follow-up of idiopathic dyspnea.  As best as I can ascertain this time he tells me that he gets dyspneic only when he walks in a hurry and it is associated with precordial chest pain both relieved by rest there is no associated radiation.  Overall symptoms are stable for many months without any progression.  Symptoms are mild to moderate in intensity and relieved by rest.  There is no associated cough or wheezing or proximal nocturnal dyspnea orthopnea or edema or fever or chills or hemoptysis.  Review of the chart shows in 2017 he had incomplete exercise stress study and a nuclear medicine stress test was recommended but it appears he has not followed through.  On this high resolution CT scan he only has aortic atherosclerosis but not coronary artery calcification.  OV  02/08/2018  Chief Complaint  Patient presents with  . Follow-up    Pt states he is much better than he was at last visit. Pt does have some SOB at night.    58 year old [place] male affected by language barrier.  Follow-up for idiopathic dyspnea.  Poor historian despite having an interpreter  In August 2018 nurse  practitioner put him on Spiriva.  In her report she indicated that pulmonary function test showed grade 1 obstruction.  In my personal opinion he only had a possible component of small airway disease.  At this point in time did not show that he is taking Spiriva.  He says he takes some inhaler at night.  He thinks the color of this inhaler is red.  This would imply he is taking albuterol at night.  He says it helps him and therefore it appears that he is having some relief in his dyspnea from inhalers.  In review of the chart it shows that after my last visit with him in November 2018 he saw Dr. Debara Pickett the cardiologist.  His cardiac medications have been adjusted.  His cardiac stress test was deemed low risk.  He has been given some medication for PVCs.  And it appears these measures have improved him.  Patient also seems to agree so.  At this point in time he has no cough.  Although the interpreter seems to tell me that when he exerts a lot he might have a mild wheezing.  But overall improved.   OV 12/14/2018  Subjective:  Patient ID: [name], male , DOB: 06-09-1961 , age 3 y.o. , MRN: [id number] , ADDRESS: [place] [id number]   12/14/2018 -   Chief Complaint  Patient presents with  . Follow-up    SOB improved with inhaler and with nasal spray   Follow-up idiopathic dyspnea.  Care details affected by language barrier [he speaks with no means] and being a poor historian despite having an interpreter.  HPI Trayvon Trumbull 59 y.o. -last seen March 2019.  He says since then he has had some headaches and he has been on some new medications.  But overall dyspnea stable.  Present on exertion relieved by rest.  Also present when he lies down.  Inhalers worked Recruitment consultant for him.  Although does not fully clear what inhaler he is taking.  He denies any cough.  Med review shows lisinopril.  Overall he feels stable and well.  Review of the chart  indicates in 2018 approximately 2 years ago he  had high-resolution CT with a lung parenchyma was clear.  He had a cardiac stress test January 2020 this year was submaximal and it was acceptable.  Of note to the interpreter we indicated to him that we now have Dr. Loanne Drilling who speaks [place].  He was very happy to hear this.  He is very acceptable to make a switch to seeing her because of reduced language barrier.    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   reports that he quit smoking about 11 years ago. He has a 10.00 pack-year smoking history. He has never used smokeless tobacco.  Past Surgical History:  Procedure Laterality Date  . APPENDECTOMY  2003   Dr. Marlou Starks:  Attempted laparoscopic and subsequent open appendectomy  . BIOPSY  10/21/2018   Procedure: BIOPSY;  Surgeon: Leighton Ruff, MD;  Location: [facility];  Service: Endoscopy;;  . CHOLECYSTECTOMY  12/2009   Dr. Excell Seltzer  . COLON SURGERY    . COLONOSCOPY N/A 10/21/2018   Procedure: COLONOSCOPY ERAS PATHWAY;  Surgeon: Leighton Ruff, MD;  Location: [facility];  Service: Endoscopy;  Laterality: N/A;  . FLEXIBLE SIGMOIDOSCOPY N/A 09/13/2013   Procedure: FLEXIBLE SIGMOIDOSCOPY;  Surgeon: Leighton Ruff, MD;  Location: [facility];  Service: Endoscopy;  Laterality: N/A;  . RIGHT COLECTOMY  12/2009   Dr Excell Seltzer: open resection for cecal diverticulitis    No Known Allergies  Immunization History  Administered Date(s) Administered  . Influenza Inj Mdck Quad Pf 11/15/2018  . Influenza,inj,Quad PF,6+ Mos 09/30/2016  . Influenza-Unspecified 08/14/2017    Family History  Problem Relation Age of Onset  . Cancer Mother      Current Outpatient Medications:  .  albuterol (PROVENTIL HFA;VENTOLIN HFA) 108 (90 Base) MCG/ACT inhaler, Inhale 2 puffs  into the lungs every 6 (six) hours as needed for wheezing or shortness of breath., Disp: 1 Inhaler, Rfl: 5 .  aspirin EC 81 MG tablet, Take 81 mg by mouth daily., Disp: , Rfl:  .  atorvastatin (LIPITOR) 40 MG tablet, TAKE 1 TABLET(40 MG) BY MOUTH DAILY, Disp: 90 tablet, Rfl: 3 .  azelastine (ASTELIN) 0.1 % nasal spray, Place into both nostrils 2 (two) times daily. Use in each nostril as directed, Disp: , Rfl:  .  Chlorpheniramine-DM (CORICIDIN HBP COUGH/COLD PO), Take 1 tablet by mouth at bedtime as needed (sinuses)., Disp: , Rfl:  .  Cholecalciferol (VITAMIN D) 50 MCG (2000 UT) tablet, Take 2,000 Units by mouth daily., Disp: , Rfl:  .  fesoterodine (TOVIAZ) 4 MG TB24 tablet, Take 4 mg by mouth every evening., Disp: , Rfl:  .  flecainide (TAMBOCOR) 100 MG tablet, Take 1 tablet (100 mg total) by mouth 2 (two) times daily., Disp: 180 tablet, Rfl: 3 .  hydrochlorothiazide (MICROZIDE) 12.5 MG capsule, Take 1 capsule (12.5 mg total) by mouth daily., Disp: 90 capsule, Rfl: 3 .  HYDROcodone-acetaminophen (NORCO) 10-325 MG tablet, Take 1 tablet by mouth every 6 (six) hours as needed. Unsure of dose, Disp: , Rfl:  .  ibuprofen (ADVIL,MOTRIN) 200 MG tablet, Take 400 mg by mouth every 6 (six) hours as needed for headache or moderate pain., Disp: , Rfl:  .  lisinopril (PRINIVIL,ZESTRIL) 20 MG tablet, TAKE 1 TABLET BY MOUTH EVERY DAY, Disp: 90  tablet, Rfl: 3 .  tamsulosin (FLOMAX) 0.4 MG CAPS capsule, Take 0.4 mg by mouth every evening. , Disp: , Rfl: 11 .  Tiotropium Bromide Monohydrate (SPIRIVA RESPIMAT) 1.25 MCG/ACT AERS, Inhale 2 puffs into the lungs daily., Disp: 1 Inhaler, Rfl: 5      Objective:   Vitals:   12/14/18 0940  BP: 120/66  Pulse: 74  SpO2: 97%  Weight: 155 lb (70.3 kg)  Height: 5\' 3"  (1.6 m)    Estimated body mass index is 27.46 kg/m as calculated from the following:   Height as of this encounter: 5\' 3"  (1.6 m).   Weight as of this encounter: 155 lb (70.3  kg).  @WEIGHTCHANGE @  [employer]   12/14/18 0940  Weight: 155 lb (70.3 kg)     Physical Exam  General Appearance:    Alert, cooperative, no distress, appears stated age - yes , Deconditioned looking - no , OBESE  - no, Sitting on Wheelchair -  no  Head:    Normocephalic, without obvious abnormality, atraumatic  Eyes:    PERRL, conjunctiva/corneas clear,  Ears:    Normal TM's and external ear canals, both ears  Nose:   Nares normal, septum midline, mucosa normal, no drainage    or sinus tenderness. OXYGEN ON  - no . Patient is @ ra   Throat:   Lips, mucosa, and tongue normal; teeth and gums normal. Cyanosis on lips - no  Neck:   Supple, symmetrical, trachea midline, no adenopathy;    thyroid:  no enlargement/tenderness/nodules; no carotid   bruit or JVD  Back:     Symmetric, no curvature, ROM normal, no CVA tenderness  Lungs:     Distress - no , Wheeze no, Barrell Chest - no, Purse lip breathing - no, Crackles - no   Chest Wall:    No tenderness or deformity.    Heart:    Regular rate and rhythm, S1 and S2 normal, no rub   or gallop, Murmur - no  Breast Exam:    NOT DONE  Abdomen:     Soft, non-tender, bowel sounds active all four quadrants,    no masses, no organomegaly. Visceral obesity - no. HAS SCAR  Genitalia:   NOT DONE  Rectal:   NOT DONE  Extremities:   Extremities - normal, Has Cane - no, Clubbing - no, Edema - no  Pulses:   2+ and symmetric all extremities  Skin:   Stigmata of Connective Tissue Disease - no  Lymph nodes:   Cervical, supraclavicular, and axillary nodes normal  Psychiatric:  Neurologic:   Pleasant - yes, Anxious - no, Flat affect - yes  CAm-ICU - neg, Alert and Oriented x 3 - yes, Moves all 4s - yes, Speech - normal, Cognition - intact           Assessment:       ICD-10-CM   1. Dyspnea, unspecified type R06.00   2. Language barrier Z78.9        Plan:     Patient Instructions     ICD-10-CM   1. Dyspnea, unspecified type R06.00   2.  Language barrier Z78.9     Stable  Plan Continue inhaler as before  followup 3 months with Dr Raynelle Fanning  And change care to her  - speaks [place]  - lisinopril role etc. To be addressed at followup     SIGNATURE    Dr. Brand Males, M.D., F.C.C.P,  Pulmonary and Critical Care Medicine Staff Physician,  [place] Director - Interstitial Lung Disease  Program  [facility] [place], [place], [address]  Pager: [phone], If no answer or between  15:00h - 7:00h: call [phone] Telephone: [phone]  10:04 AM 12/14/2018

## 2018-12-14 NOTE — Patient Instructions (Addendum)
ICD-10-CM   1. Dyspnea, unspecified type R06.00   2. Language barrier Z78.9     Stable  Plan Continue inhaler as before  followup 3 months with Dr Raynelle Fanning  And change care to her  - speaks [place]  - lisinopril role etc. To be addressed at followup

## 2018-12-21 ENCOUNTER — Ambulatory Visit: Payer: BLUE CROSS/BLUE SHIELD | Admitting: Internal Medicine

## 2018-12-21 ENCOUNTER — Encounter: Payer: Self-pay | Admitting: Internal Medicine

## 2018-12-21 VITALS — BP 123/85 | HR 73 | Ht 63.0 in | Wt 155.6 lb

## 2018-12-21 DIAGNOSIS — I499 Cardiac arrhythmia, unspecified: Secondary | ICD-10-CM

## 2018-12-21 DIAGNOSIS — Z5181 Encounter for therapeutic drug level monitoring: Secondary | ICD-10-CM

## 2018-12-21 DIAGNOSIS — R0789 Other chest pain: Secondary | ICD-10-CM | POA: Diagnosis not present

## 2018-12-21 DIAGNOSIS — Z79899 Other long term (current) drug therapy: Secondary | ICD-10-CM | POA: Diagnosis not present

## 2018-12-21 DIAGNOSIS — I498 Other specified cardiac arrhythmias: Secondary | ICD-10-CM

## 2018-12-21 NOTE — Patient Instructions (Signed)

## 2018-12-21 NOTE — Progress Notes (Signed)
OFFICE NOTE  Chief Complaint:  Follow-up stress test  Primary Care Physician: Rogers Blocker, MD  HPI:  Eduardo West is a pleasant 58 year old [name] he is male who is seen today with a Optometrist. He is referred by Dr. Jimmye Norman for evaluation of an abnormal heart rhythm. EKG today shows ventricular bigeminy with ST and T wave abnormalities laterally concerning for ischemia. Eduardo West reports some increasing chest discomfort with exertion as well as shortness of breath. He says that he typically walks 30-45 minutes almost every day but recently has had worsening shortness of breath and his fatigue after only 5-10 minutes. He says that his chest discomfort is worse when exerting himself and released somewhat by rest. There is no significant family history of heart disease per his report although hypertension is his family. He was recently hospitalized for small bowel obstruction and has improved. He has had partial colon resection as well as appendectomy in the past. In January 2015 while hospitalized he was seen for chest pain by Dr. Pernell Dupre. Echocardiogram was recommended which showed an EF of 60-65% with mild diastolic dysfunction and moderate concentric LVH. There were no segmental wall motion abnormalities. He reports his symptoms have progressively gotten worse since his recent hospitalization.  Eduardo West returns for follow-up. At his last office visit I started him on low-dose beta blocker and he remains in ventricular bigeminy today. The electrical rate is 68 however his pulse rate is half of that at 34. He cannot express whether he has more fatigue or chest discomfort on the medicine than he had before. He certainly does not feel in any improvement on the beta blocker. Pulse rate may be slower.  I had the pleasure seeing Eduardo West back in the office today. In the interim he seen Dr. Cristopher Peru twice, who started him on flecainide and increased dose to 100 mg twice a day. This seems  to have been effective in suppressing his PVCs. He does feel somewhat better and has more energy and is less short of breath.  I saw Eduardo West back in the office today with an interpreter. Overall he is doing fairly well. He is currently on flecainide 100 mg twice a day. He reports nearly complete resolution of his palpitations. His EKG shows no evidence of bigeminy. He says he gets occasional sharp chest pain but no significant symptoms that sound anginal. He also has some mild shortness of breath. He underwent a stress test last year which was negative for ischemia. Blood pressure appears well controlled on Dyazide.  10/28/2016  Eduardo West returns today for follow-up. He is accompanied by a [place] interpreter. Overall he feels well. He denies any significant palpitations. He has been on flecainide 100 mg twice a day. QTc interval is 467 ms today with EKG demonstrating sinus rhythm and first-degree AV block at 212 ms. He recently was in the hospital for abdominal pain and was given some hydrocodone with improvement. The etiology was unclear. He's not had any significant palpitations. He denies any chest pain. He had a stress test in April of this year which was nonischemic. He will need another stress test in 2 years for monitoring of flecainide therapy.  08/03/2017  Eduardo West returns today for follow-up. Recently he was seen by Almyra Deforest, PA-C for episodes of chest pain at rest and with exertion. Amlodipine was added for antianginal benefit however he says that he feels dizzy on this medication. Blood pressure is low normal today at  112/88. I suspect he may be getting hypotensive with it. He does not feel that it's improved his chest discomfort. It is also notable that he is not taking aspirin daily. He was supposed to be on this medicine. I went over this with him in the interpreter today and provided samples of 81 mg aspirin. He should take this daily. This is a treatment for coronary  disease.  11/11/2017  Eduardo West returns today for follow-up.  He is accompanied by an interpreter.  When I last saw him we readjusted his medications, namely we discontinued his amlodipine.  He was having problems with swelling and he had no improvement in his chest discomfort.  At times he was also having some hypotension.  Blood pressure was retested today and was initially elevated 152/90 however recheck came back at 124/80 in the left arm and 130/88 in the right arm.  He remains on flecainide and has had no recurrent atrial fibrillation.  EKG today shows sinus rhythm with a QTC of 425 ms.  11/11/2018  Eduardo West is seen today in follow-up with the [place] translator.  Symptomatically he is doing well.  Blood pressure seems to be still elevated.  He denies any palpitations and is tolerating his flecainide.  His last stress test was 2 years ago.  EKG shows sinus rhythm with first-degree AV block and QTC of 360 ms.  He denies chest pain or worsening shortness of breath.  12/21/2018  Eduardo West is seen today in follow-up.  He is accompanied by [place].  Symptom wise he denies any palpitations.  He feels like he is doing well on the flecainide.  He does get some chest discomfort but only when he is under stress or if he is arguing with his teenage daughters.  Apparently he has 3 daughters at home.  He did undergo a stress test which was routine for monitoring on flecainide.  This did not demonstrate any ischemic EKG changes nor any QRS widening associated with the flecainide.  PMHx:  Past Medical History:  Diagnosis Date  . Abdominal pain   . Buzzing in ear   . Chronic diarrhea 03/04/2012  . Colon polyp   . Diarrhea   . Diverticulitis of cecum 12/2009   Resected 2011:  COLON, SEGMENTAL RESECTION, RIGHT AND TERMINAL ILEUM : - SEGMENT OF COLON  . Hypertension   . Kidney stones   . Pleural effusion, left   . 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 Right leg numbness     Past Surgical History:   Procedure Laterality Date  . APPENDECTOMY  2003   Dr. Marlou Starks:  Attempted laparoscopic and subsequent open appendectomy  . BIOPSY  10/21/2018   Procedure: BIOPSY;  West: Leighton Ruff, MD;  Location: [facility];  Service: Endoscopy;;  . CHOLECYSTECTOMY  12/2009   Dr. Excell Seltzer  . COLON SURGERY    . COLONOSCOPY N/A 10/21/2018   Procedure: COLONOSCOPY ERAS PATHWAY;  West: Leighton Ruff, MD;  Location: [facility];  Service: Endoscopy;  Laterality: N/A;  . FLEXIBLE SIGMOIDOSCOPY N/A 09/13/2013   Procedure: FLEXIBLE SIGMOIDOSCOPY;  West: Leighton Ruff, MD;  Location: [facility];  Service: Endoscopy;  Laterality: N/A;  . RIGHT COLECTOMY  12/2009   Dr Excell Seltzer: open resection for cecal diverticulitis    FAMHx:  Family History  Problem Relation Age of Onset  . Cancer Mother     SOCHx:   reports that he quit smoking about 11 years ago. He has a 10.00 pack-year smoking history. He has never  used smokeless tobacco. He reports that he does not drink alcohol or use drugs.  ALLERGIES:  No Known Allergies  ROS: Pertinent items noted in HPI and remainder of comprehensive ROS otherwise negative.  HOME MEDS: Current Outpatient Medications  Medication Sig Dispense Refill  . albuterol (PROVENTIL HFA;VENTOLIN HFA) 108 (90 Base) MCG/ACT inhaler Inhale 2 puffs into the lungs every 6 (six) hours as needed for wheezing or shortness of breath. 1 Inhaler 5  . aspirin EC 81 MG tablet Take 81 mg by mouth daily.    [name] Kitchen atorvastatin (LIPITOR) 40 MG tablet TAKE 1 TABLET(40 MG) BY MOUTH DAILY 90 tablet 3  . azelastine (ASTELIN) 0.1 % nasal spray Place into both nostrils 2 (two) times daily. Use in each nostril as directed    . Cholecalciferol (VITAMIN D) 50 MCG (2000 UT) tablet Take 2,000 Units by mouth daily.    . fesoterodine (TOVIAZ) 4 MG TB24 tablet Take 4 mg by mouth every evening.    . flecainide (TAMBOCOR) 100 MG tablet Take 1 tablet (100 mg total) by mouth 2 (two) times daily. 180 tablet 3   . hydrochlorothiazide (MICROZIDE) 12.5 MG capsule Take 1 capsule (12.5 mg total) by mouth daily. 90 capsule 3  . HYDROcodone-acetaminophen (NORCO) 10-325 MG tablet Take 1 tablet by mouth every 6 (six) hours as needed. Unsure of dose    . ibuprofen (ADVIL,MOTRIN) 200 MG tablet Take 400 mg by mouth every 6 (six) hours as needed for headache or moderate pain.    [name] Kitchen lisinopril (PRINIVIL,ZESTRIL) 20 MG tablet TAKE 1 TABLET BY MOUTH EVERY DAY 90 tablet 3  . tamsulosin (FLOMAX) 0.4 MG CAPS capsule Take 0.4 mg by mouth every evening.   11  . Tiotropium Bromide Monohydrate (SPIRIVA RESPIMAT) 1.25 MCG/ACT AERS Inhale 2 puffs into the lungs daily. 1 Inhaler 5   No current facility-administered medications for this visit.     LABS/IMAGING: No results found for this or any previous visit (from the past 48 hour(s)). No results found.  VITALS: BP 123/85   Pulse 73   Ht 5\' 3"  (1.6 m)   Wt 155 lb 9.6 oz (70.6 kg)   BMI 27.56 kg/m   EXAM: Deferred  EKG: Deferred  ASSESSMENT: 1. Ventricular bigeminy with nonconducted PVCs - suppressed on flecainide 2. Chest pain with exertion - low risk exercise stress test (11/2018) 3. Dyspnea on exertion - improved 4. Hypertension 5. Aortic atherosclerosis  PLAN: 1.   Eduardo West a low risk exercise stress test -I do not suspect the chest discomfort that he gets which may be with exertion or when he gets upset is cardiac.  He denies any worsening palpitations and PVCs appear to be suppressed on flecainide.  We will continue his current dose of medication.  Blood pressure is at goal as well.  Follow-up with me annually or sooner as necessary.  Eduardo Casino, MD, [facility], [place] Director of the Advanced Lipid Disorders &  Cardiovascular Risk Reduction Clinic Attending Cardiologist  Direct Dial: [phone]  Fax: [phone]  Website:  [email or web address] Laing 12/21/2018, 1:00 PM

## 2019-03-01 ENCOUNTER — Telehealth: Payer: Self-pay

## 2019-03-01 ENCOUNTER — Other Ambulatory Visit: Payer: Self-pay

## 2019-03-01 MED ORDER — ALBUTEROL SULFATE HFA 108 (90 BASE) MCG/ACT IN AERS: 2.0000 | INHALATION_SPRAY | Freq: Four times a day (QID) | RESPIRATORY_TRACT | 5 refills | Status: DC | PRN

## 2019-03-01 NOTE — Telephone Encounter (Signed)
Used interpreter services with agent [phone] to contact patient to reschedule April OV with Dr. Loanne Drilling to July 2020.  Patient agreed thru interpreter that a July appt is acceptable.  Placed recall and note in appt to please use interpreter to reschedule.  Patient also requested a refill on his albuterol inhaler to [facility].  RF sent. Patient states he is doing well and advised to contact us if he needs refills or has other concerns prior to July visit.  Patient acknowledged understanding.  Nothing further needed.

## 2019-03-15 ENCOUNTER — Ambulatory Visit: Payer: BLUE CROSS/BLUE SHIELD | Admitting: Pulmonary Disease

## 2019-11-03 ENCOUNTER — Other Ambulatory Visit: Payer: Self-pay | Admitting: Internal Medicine

## 2020-04-28 ENCOUNTER — Other Ambulatory Visit: Payer: Self-pay | Admitting: Internal Medicine

## 2020-08-02 ENCOUNTER — Encounter: Payer: Self-pay | Admitting: Internal Medicine

## 2020-08-02 ENCOUNTER — Ambulatory Visit (INDEPENDENT_AMBULATORY_CARE_PROVIDER_SITE_OTHER): Payer: 59 | Admitting: Internal Medicine

## 2020-08-02 ENCOUNTER — Other Ambulatory Visit: Payer: Self-pay

## 2020-08-02 VITALS — BP 132/84 | HR 70 | Ht 63.0 in | Wt 161.4 lb

## 2020-08-02 DIAGNOSIS — Z5181 Encounter for therapeutic drug level monitoring: Secondary | ICD-10-CM

## 2020-08-02 DIAGNOSIS — I498 Other specified cardiac arrhythmias: Secondary | ICD-10-CM | POA: Diagnosis not present

## 2020-08-02 DIAGNOSIS — Z79899 Other long term (current) drug therapy: Secondary | ICD-10-CM | POA: Diagnosis not present

## 2020-08-02 DIAGNOSIS — E7800 Pure hypercholesterolemia, unspecified: Secondary | ICD-10-CM

## 2020-08-02 NOTE — Progress Notes (Signed)
OFFICE NOTE  Chief Complaint:  Follow-up  Primary Care Physician: Rogers Blocker, MD  HPI:  Eduardo West is a pleasant 59 year old [name] he is male who is seen today with a Optometrist. He is referred by Dr. Jimmye Norman for evaluation of an abnormal heart rhythm. EKG today shows ventricular bigeminy with ST and T wave abnormalities laterally concerning for ischemia. Eduardo West reports some increasing chest discomfort with exertion as well as shortness of breath. He says that he typically walks 30-45 minutes almost every day but recently has had worsening shortness of breath and his fatigue after only 5-10 minutes. He says that his chest discomfort is worse when exerting himself and released somewhat by rest. There is no significant family history of heart disease per his report although hypertension is his family. He was recently hospitalized for small bowel obstruction and has improved. He has had partial colon resection as well as appendectomy in the past. In January 2015 while hospitalized he was seen for chest pain by Dr. Pernell Dupre. Echocardiogram was recommended which showed an EF of 60-65% with mild diastolic dysfunction and moderate concentric LVH. There were no segmental wall motion abnormalities. He reports his symptoms have progressively gotten worse since his recent hospitalization.  Eduardo West returns for follow-up. At his last office visit I started him on low-dose beta blocker and he remains in ventricular bigeminy today. The electrical rate is 68 however his pulse rate is half of that at 34. He cannot express whether he has more fatigue or chest discomfort on the medicine than he had before. He certainly does not feel in any improvement on the beta blocker. Pulse rate may be slower.  I had the pleasure seeing Eduardo West back in the office today. In the interim he seen Dr. Cristopher Peru twice, who started him on flecainide and increased dose to 100 mg twice a day. This seems to have been  effective in suppressing his PVCs. He does feel somewhat better and has more energy and is less short of breath.  I saw Eduardo West back in the office today with an interpreter. Overall he is doing fairly well. He is currently on flecainide 100 mg twice a day. He reports nearly complete resolution of his palpitations. His EKG shows no evidence of bigeminy. He says he gets occasional sharp chest pain but no significant symptoms that sound anginal. He also has some mild shortness of breath. He underwent a stress test last year which was negative for ischemia. Blood pressure appears well controlled on Dyazide.  10/28/2016  Eduardo West returns today for follow-up. He is accompanied by a [place] interpreter. Overall he feels well. He denies any significant palpitations. He has been on flecainide 100 mg twice a day. QTc interval is 467 ms today with EKG demonstrating sinus rhythm and first-degree AV block at 212 ms. He recently was in the hospital for abdominal pain and was given some hydrocodone with improvement. The etiology was unclear. He's not had any significant palpitations. He denies any chest pain. He had a stress test in April of this year which was nonischemic. He will need another stress test in 2 years for monitoring of flecainide therapy.  08/03/2017  Eduardo West returns today for follow-up. Recently he was seen by Almyra Deforest, PA-C for episodes of chest pain at rest and with exertion. Amlodipine was added for antianginal benefit however he says that he feels dizzy on this medication. Blood pressure is low normal today at 112/88. I  suspect he may be getting hypotensive with it. He does not feel that it's improved his chest discomfort. It is also notable that he is not taking aspirin daily. He was supposed to be on this medicine. I went over this with him in the interpreter today and provided samples of 81 mg aspirin. He should take this daily. This is a treatment for coronary  disease.  11/11/2017  Eduardo West returns today for follow-up.  He is accompanied by an interpreter.  When I last saw him we readjusted his medications, namely we discontinued his amlodipine.  He was having problems with swelling and he had no improvement in his chest discomfort.  At times he was also having some hypotension.  Blood pressure was retested today and was initially elevated 152/90 however recheck came back at 124/80 in the left arm and 130/88 in the right arm.  He remains on flecainide and has had no recurrent atrial fibrillation.  EKG today shows sinus rhythm with a QTC of 425 ms.  11/11/2018  Eduardo West is seen today in follow-up with the [place] translator.  Symptomatically he is doing well.  Blood pressure seems to be still elevated.  He denies any palpitations and is tolerating his flecainide.  His last stress test was 2 years ago.  EKG shows sinus rhythm with first-degree AV block and QTC of 360 ms.  He denies chest pain or worsening shortness of breath.  12/21/2018  Eduardo West is seen today in follow-up.  He is accompanied by [place].  Symptom wise he denies any palpitations.  He feels like he is doing well on the flecainide.  He does get some chest discomfort but only when he is under stress or if he is arguing with his teenage daughters.  Apparently he has 3 daughters at home.  He did undergo a stress test which was routine for monitoring on flecainide.  This did not demonstrate any ischemic EKG changes nor any QRS widening associated with the flecainide.  08/02/2020  Eduardo West returns today for follow-up.  Overall he is doing well.  He is again accompanied by by a [place]. translator.  He denies any chest pain or shortness of breath.  He has near complete resolution of his PVCs.  Overall he is tolerating the flecainide well.  He had stress testing last year which was negative for ischemia.  PMHx:  Past Medical History:  Diagnosis Date  . Abdominal pain    . Buzzing in ear   . Chronic diarrhea 03/04/2012  . Colon polyp   . Diarrhea   . Diverticulitis of cecum 12/2009   Resected 2011:  COLON, SEGMENTAL RESECTION, RIGHT AND TERMINAL ILEUM : - SEGMENT OF COLON  . Hypertension   . Kidney stones   . Pleural effusion, left   . 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 Right leg numbness     Past Surgical History:  Procedure Laterality Date  . APPENDECTOMY  2003   Dr. Marlou Starks:  Attempted laparoscopic and subsequent open appendectomy  . BIOPSY  10/21/2018   Procedure: BIOPSY;  Surgeon: Leighton Ruff, MD;  Location: [facility];  Service: Endoscopy;;  . CHOLECYSTECTOMY  12/2009   Dr. Excell Seltzer  . COLON SURGERY    . COLONOSCOPY N/A 10/21/2018   Procedure: COLONOSCOPY ERAS PATHWAY;  Surgeon: Leighton Ruff, MD;  Location: [facility];  Service: Endoscopy;  Laterality: N/A;  . FLEXIBLE SIGMOIDOSCOPY N/A 09/13/2013   Procedure: FLEXIBLE SIGMOIDOSCOPY;  Surgeon: Leighton Ruff, MD;  Location: [facility];  Service:  Endoscopy;  Laterality: N/A;  . RIGHT COLECTOMY  12/2009   Dr Excell Seltzer: open resection for cecal diverticulitis    FAMHx:  Family History  Problem Relation Age of Onset  . Cancer Mother     SOCHx:   reports that he quit smoking about 12 years ago. He has a 10.00 pack-year smoking history. He has never used smokeless tobacco. He reports that he does not drink alcohol and does not use drugs.  ALLERGIES:  No Known Allergies  ROS: Pertinent items noted in HPI and remainder of comprehensive ROS otherwise negative.  HOME MEDS: Current Outpatient Medications  Medication Sig Dispense Refill  . albuterol (PROVENTIL HFA;VENTOLIN HFA) 108 (90 Base) MCG/ACT inhaler Inhale 2 puffs into the lungs every 6 (six) hours as needed for wheezing or shortness of breath. 1 Inhaler 5  . aspirin EC 81 MG tablet Take 81 mg by mouth daily.    [name] Kitchen atorvastatin (LIPITOR) 40 MG tablet TAKE 1 TABLET BY MOUTH EVERY DAY 90 tablet 1  . azelastine (ASTELIN) 0.1 % nasal spray  Place into both nostrils 2 (two) times daily. Use in each nostril as directed    . Cholecalciferol (VITAMIN D) 50 MCG (2000 UT) tablet Take 2,000 Units by mouth daily.    . fesoterodine (TOVIAZ) 4 MG TB24 tablet Take 4 mg by mouth every evening.    . flecainide (TAMBOCOR) 100 MG tablet TAKE 1 TABLET BY MOUTH TWICE DAILY 180 tablet 1  . HYDROcodone-acetaminophen (NORCO) 10-325 MG tablet Take 1 tablet by mouth every 6 (six) hours as needed. Unsure of dose    . ibuprofen (ADVIL,MOTRIN) 200 MG tablet Take 400 mg by mouth every 6 (six) hours as needed for headache or moderate pain.    [name] Kitchen lisinopril (PRINIVIL,ZESTRIL) 20 MG tablet TAKE 1 TABLET BY MOUTH EVERY DAY 90 tablet 3  . tamsulosin (FLOMAX) 0.4 MG CAPS capsule Take 0.4 mg by mouth every evening.   11  . Tiotropium Bromide Monohydrate (SPIRIVA RESPIMAT) 1.25 MCG/ACT AERS Inhale 2 puffs into the lungs daily. 1 Inhaler 5   No current facility-administered medications for this visit.    LABS/IMAGING: No results found for this or any previous visit (from the past 48 hour(s)). No results found.  VITALS: BP 132/84   Pulse 70   Ht 5\' 3"  (1.6 m)   Wt 161 lb 6.4 oz (73.2 kg)   SpO2 97%   BMI 28.59 kg/m   EXAM: Deferred  EKG: Normal sinus rhythm at 70, left axis deviation, incomplete right bundle branch block, QTC 459ms-personally reviewed  ASSESSMENT: 1. Ventricular bigeminy with nonconducted PVCs - suppressed on flecainide 2. Chest pain with exertion - low risk exercise stress test (11/2018) 3. Dyspnea on exertion - improved 4. Hypertension 5. Aortic atherosclerosis  PLAN: 1.   Eduardo West is doing well with suppression of his PVCs on flecainide.  He will need a repeat exercise stress test next year prior to follow-up.  He denies any dyspnea.  Blood pressures well controlled.  No changes to his meds today.  Follow-up with me annually.  Pixie Casino, MD, [facility], [place] Director of the Advanced  Lipid Disorders &  Cardiovascular Risk Reduction Clinic Attending Cardiologist  Direct Dial: [phone]  Fax: [phone]  Website:  [email or web address]  Nadean Corwin Gerrianne Aydelott 08/02/2020, 2:16 PM

## 2020-08-02 NOTE — Patient Instructions (Signed)
Medication Instructions:  None Ordered At This Time.  *If you need a refill on your cardiac medications before your next appointment, please call your pharmacy*   Lab Work: None Ordered At This Time.  If you have labs (blood work) drawn today and your tests are completely normal, you will receive your results only by: [name] Kitchen MyChart Message (if you have MyChart) OR . A paper copy in the mail If you have any lab test that is abnormal or we need to change your treatment, we will call you to review the results.   Testing/Procedures: Your physician has requested that you have an exercise tolerance test 1 year. For further information please visit [email or web address]. Please also follow instruction sheet, as given. This will take place at [address], [address].  Do not drink or eat foods with caffeine for 24 hours before the test. (Chocolate, coffee, tea, or energy drinks)  If you use an inhaler, bring it with you to the test.  Do not smoke for 4 hours before the test.  Wear comfortable shoes and clothing.  Follow-Up: At [facility], you and your health needs are our priority.  As part of our continuing mission to provide you with exceptional heart care, we have created designated Provider Care Teams.  These Care Teams include your primary Cardiologist (physician) and Advanced Practice Providers (APPs -  Physician Assistants and Nurse Practitioners) who all work together to provide you with the care you need, when you need it.  We recommend signing up for the patient portal called "MyChart".  Sign up information is provided on this After Visit Summary.  MyChart is used to connect with patients for Virtual Visits (Telemedicine).  Patients are able to view lab/test results, encounter notes, upcoming appointments, etc.  Non-urgent messages can be sent to your provider as well.   To learn more about what you can do with MyChart, go to [email or web address].    Your next appointment:   1  year(s)  The format for your next appointment:   In Person  Provider:   K. Mali [name], MD

## 2020-08-19 ENCOUNTER — Encounter (HOSPITAL_COMMUNITY): Payer: Self-pay | Admitting: Internal Medicine

## 2020-08-30 ENCOUNTER — Other Ambulatory Visit (HOSPITAL_COMMUNITY)
Admission: RE | Admit: 2020-08-30 | Discharge: 2020-08-30 | Disposition: A | Discharge status: Home / Self Care | Payer: 59 | Source: Ambulatory Visit | Attending: Cardiovascular Disease | Admitting: Cardiovascular Disease

## 2020-08-30 ENCOUNTER — Telehealth (HOSPITAL_COMMUNITY): Payer: Self-pay | Admitting: *Deleted

## 2020-08-30 DIAGNOSIS — Z20822 Contact with and (suspected) exposure to covid-19: Secondary | ICD-10-CM | POA: Insufficient documentation

## 2020-08-30 DIAGNOSIS — Z01812 Encounter for preprocedural laboratory examination: Secondary | ICD-10-CM | POA: Insufficient documentation

## 2020-08-30 LAB — SARS CORONAVIRUS 2 (TAT 6-24 HRS): SARS Coronavirus 2: NEGATIVE

## 2020-08-30 NOTE — Telephone Encounter (Signed)
Close encounter 

## 2020-09-03 ENCOUNTER — Other Ambulatory Visit: Payer: Self-pay

## 2020-09-03 ENCOUNTER — Ambulatory Visit (HOSPITAL_COMMUNITY)
Admission: RE | Admit: 2020-09-03 | Discharge: 2020-09-03 | Disposition: A | Discharge status: Home / Self Care | Payer: 59 | Source: Ambulatory Visit | Attending: Cardiovascular Disease | Admitting: Cardiovascular Disease

## 2020-09-03 ENCOUNTER — Encounter (HOSPITAL_COMMUNITY): Payer: Self-pay | Admitting: *Deleted

## 2020-09-03 ENCOUNTER — Telehealth: Payer: Self-pay | Admitting: Internal Medicine

## 2020-09-03 DIAGNOSIS — I498 Other specified cardiac arrhythmias: Secondary | ICD-10-CM

## 2020-09-03 DIAGNOSIS — E7800 Pure hypercholesterolemia, unspecified: Secondary | ICD-10-CM

## 2020-09-03 DIAGNOSIS — Z79899 Other long term (current) drug therapy: Secondary | ICD-10-CM | POA: Diagnosis present

## 2020-09-03 DIAGNOSIS — Z5181 Encounter for therapeutic drug level monitoring: Secondary | ICD-10-CM | POA: Diagnosis not present

## 2020-09-03 LAB — EXERCISE TOLERANCE TEST
Estimated workload: 4.6 METS
Exercise duration (min): 2 min
Exercise duration (sec): 43 s
MPHR: 163 {beats}/min
Peak HR: 96 {beats}/min
Percent HR: 58 %
Rest HR: 66 {beats}/min

## 2020-09-03 NOTE — Telephone Encounter (Signed)
Patient's daughter states patient has not yet received results from pre ETT COVID test, completed on 08/30/20. She would like to know whether or not ETT needs to be rescheduled. Please advise.

## 2020-09-03 NOTE — Progress Notes (Signed)
Interpreter present during test was  Waymon Amato Eban from language resources.

## 2020-09-03 NOTE — Telephone Encounter (Signed)
Called left message - covid test was negative. Patient should keep appointment to ETT FOR TODAY AT 3:30 PM .  MAY QUESTION MAY CALL BACK

## 2020-09-06 ENCOUNTER — Encounter: Payer: Self-pay | Admitting: Internal Medicine

## 2020-10-25 ENCOUNTER — Other Ambulatory Visit: Payer: Self-pay | Admitting: Internal Medicine

## 2021-01-10 ENCOUNTER — Encounter: Payer: Self-pay | Admitting: Pulmonary Disease

## 2021-01-10 ENCOUNTER — Other Ambulatory Visit: Payer: Self-pay

## 2021-01-10 ENCOUNTER — Ambulatory Visit (INDEPENDENT_AMBULATORY_CARE_PROVIDER_SITE_OTHER): Payer: Medicaid Other | Admitting: Pulmonary Disease

## 2021-01-10 VITALS — BP 124/84 | HR 80 | Temp 98.1°F | Ht 63.0 in | Wt 160.0 lb

## 2021-01-10 DIAGNOSIS — J4530 Mild persistent asthma, uncomplicated: Secondary | ICD-10-CM

## 2021-01-10 DIAGNOSIS — Z8709 Personal history of other diseases of the respiratory system: Secondary | ICD-10-CM

## 2021-01-10 DIAGNOSIS — J90 Pleural effusion, not elsewhere classified: Secondary | ICD-10-CM

## 2021-01-10 MED ORDER — ALBUTEROL SULFATE HFA 108 (90 BASE) MCG/ACT IN AERS: 2.0000 | INHALATION_SPRAY | Freq: Four times a day (QID) | RESPIRATORY_TRACT | 5 refills | Status: DC | PRN

## 2021-01-10 MED ORDER — ADVAIR HFA 115-21 MCG/ACT IN AERO: 2.0000 | INHALATION_SPRAY | Freq: Two times a day (BID) | RESPIRATORY_TRACT | 6 refills | Status: DC

## 2021-01-10 NOTE — Progress Notes (Signed)
Subjective:   PATIENT ID: Eduardo West GENDER: male DOB: [birth year], MRN: [id number]   HPI  Chief Complaint  Patient presents with  . New Patient (Initial Visit)    Intermittent SOB with exertion. 10 years ago, had PE but unable to get all of the fluid. Had a CT scan 1 year ago to see if any fluid was there. Results showed the same thing from 10 years ago., Albuterol working well.    Reason for Visit: New consult for shortness of breath  Mr. Lou Loewe is a 60 year old former smoker with history pulmonary embolism who presents for shortness of breath. Translator present  He has been having shortness of breath with exertion. Uses albuterol 3-4 times a day for shortness of breath with relief. Rest also improves his symptoms. Denies wheezing and cough. Symptoms occur during the day but seem worse at night. He has to sleep elevated with three pillows. Denies chest pain.  He reports a prior history of chronic pleural effusions in 2011 and was advised surgery vs monitoring. Pleural studies with lymphocytic predominance. Previously smoked 20 years for 1/2 ppd. Quit 11 years.   Social History: Previously smoked 20 years for 1/2 ppd. Quit 11 years in 2011. Previously worked as a Psychologist, sport and exercise, Emergency planning/management officer, Research scientist (medical)  I have personally reviewed patient's past medical/family/social history, allergies, current medications.  Past Medical History:  Diagnosis Date  . Abdominal pain   . Buzzing in ear   . Chronic diarrhea 03/04/2012  . Colon polyp   . Diarrhea   . Diverticulitis of cecum 12/2009   Resected 2011:  COLON, SEGMENTAL RESECTION, RIGHT AND TERMINAL ILEUM : - SEGMENT OF COLON  . Hypertension   . Kidney stones   . Pleural effusion, left   . 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 Right leg numbness      Family History  Problem Relation Age of Onset  . Cancer Mother      Social History   Occupational History  . Occupation: unemployed  Tobacco Use  . Smoking status: Former Smoker     Packs/day: 0.50    Years: 20.00    Pack years: 10.00    Quit date: 11/30/2009    Years since quitting: 11.1  . Smokeless tobacco: Never Used  Substance and Sexual Activity  . Alcohol use: No  . Drug use: No  . Sexual activity: Not on file    No Known Allergies   Outpatient Medications Prior to Visit  Medication Sig Dispense Refill  . aspirin EC 81 MG tablet Take 81 mg by mouth daily.    [name] Kitchen atorvastatin (LIPITOR) 40 MG tablet TAKE 1 TABLET BY MOUTH EVERY DAY 90 tablet 1  . azelastine (ASTELIN) 0.1 % nasal spray Place into both nostrils 2 (two) times daily. Use in each nostril as directed    . Cholecalciferol (VITAMIN D) 50 MCG (2000 UT) tablet Take 2,000 Units by mouth daily.    . fesoterodine (TOVIAZ) 4 MG TB24 tablet Take 4 mg by mouth every evening.    . flecainide (TAMBOCOR) 100 MG tablet TAKE 1 TABLET BY MOUTH TWICE DAILY 180 tablet 1  . HYDROcodone-acetaminophen (NORCO) 10-325 MG tablet Take 1 tablet by mouth every 6 (six) hours as needed. Unsure of dose    . ibuprofen (ADVIL,MOTRIN) 200 MG tablet Take 400 mg by mouth every 6 (six) hours as needed for headache or moderate pain.    [name] Kitchen lisinopril (PRINIVIL,ZESTRIL) 20 MG tablet TAKE 1 TABLET BY MOUTH EVERY  DAY 90 tablet 3  . tamsulosin (FLOMAX) 0.4 MG CAPS capsule Take 0.4 mg by mouth every evening.   11  . Tiotropium Bromide Monohydrate (SPIRIVA RESPIMAT) 1.25 MCG/ACT AERS Inhale 2 puffs into the lungs daily. 1 Inhaler 5  . albuterol (PROVENTIL HFA;VENTOLIN HFA) 108 (90 Base) MCG/ACT inhaler Inhale 2 puffs into the lungs every 6 (six) hours as needed for wheezing or shortness of breath. 1 Inhaler 5   No facility-administered medications prior to visit.    ROS   Objective:   Vitals:   01/10/21 1032  BP: 124/84  Pulse: 80  Temp: 98.1 F (36.7 C)  SpO2: 99%  Weight: 160 lb (72.6 kg)  Height: 5\' 3"  (1.6 m)   SpO2: 99 % O2 Device: None (Room air)  Physical Exam: General: Well-appearing, no acute distress HENT: [place],  AT Eyes: EOMI, no scleral icterus Respiratory: Clear to auscultation bilaterally.  No crackles, wheezing or rales Cardiovascular: RRR, -M/R/G, no JVD Extremities:-Edema,-tenderness Neuro: AAO x4, CNII-XII grossly intact Skin: Intact, no rashes or bruising Psych: Normal mood, normal affect  Data Reviewed:  Imaging: CXR 11/12/11 - Small loculated left pleural effusion with basilar scarring CXR 02/04/12 - Tiny left pleural effusion. Unchanged left basilar scarring CXR 04/04/12 - Left blunting of costophrenic angle CT A/P 10/10/12 - Bibasilar pleural effusions. Left basilar pleural thickening CT A/P 01/24/14 - Similar left basilar thickening. SBO. S/p right colectomy CT HR Chest 01/13/17 - Pleural parenchymal scarring  PFT: 07/26/17  FVC 3.62 (97%) FEV1 2.81 (93%) Ratio 70  TLC 74% DLCO 87% Interpretation: Mixed mild obstructive and restrictive defect. No significant bronchodilator response  Labs: CBC    Component Value Date/Time   WBC 7.4 06/25/2017 1323   RBC 3.88 (L) 06/25/2017 1323   HGB 12.3 (L) 06/25/2017 1323   HCT 36.6 (L) 06/25/2017 1323   PLT 275 06/25/2017 1323   MCV 94.3 06/25/2017 1323   MCH 31.7 06/25/2017 1323   MCHC 33.6 06/25/2017 1323   RDW 12.5 06/25/2017 1323   LYMPHSABS 1.4 12/05/2014 1025   MONOABS 0.4 12/05/2014 1025   EOSABS 0.3 12/05/2014 1025   BASOSABS 0.0 12/05/2014 1025  Absolute eos 12/05/14 - 300  Imaging, labs and test noted above have been reviewed independently by me.    Assessment & Plan:   Discussion: 60 year old male with prior pleural effusion in 2011 which self resolved who presents with shortness of breath on exertion. Prior PFTs reviewed and demonstrated mixed obstructive and restrictive defect. Combined with his clinical symptoms, this could be asthma. Will plan to repeat PFTs to determine if obstruction has worsened. His chest imaging in the EMR from 2011-2018 has been reviewed and demonstrate chronic left pleural parenchymal scarring  likely related to his prior pleural effusion. Will obtain recent imaging to ensure no progression of any interstitial lung disease or scarring that could be contributing to his respiratory status.  Mild Persistent Asthma --Advair TWO puffs TWO times daily. This is your EVERYDAY medicine --Albuterol TWO puffs AS NEEDED. This is your EMERGENCY inhaler --Will arrange Pulmonary Function Test  Pleural Effusion/Scarring --Will obtain CT Chest without contrast  Health Maintenance Immunization History  Administered Date(s) Administered  . Influenza Inj Mdck Quad Pf 11/15/2018  . Influenza, High Dose Seasonal PF 10/01/2020  . Influenza,inj,Quad PF,6+ Mos 09/30/2016  . Influenza-Unspecified 08/14/2017  . Moderna SARS-COV2 Booster Vaccination 10/01/2020  . Moderna Sars-Covid-2 Vaccination 02/20/2020, 03/19/2020   CT Lung Screen - not indicated  Orders Placed This Encounter  Procedures  .  CT Chest Wo Contrast    160 / no spinal stimulator, body injector or glucose monitor / no needs / needs interpreter- vietnamese / healthy blue Epic order/ miriam w holly 01/10/2021 no to covid ?s/ miriam Pt aware to call and cancel/reschedule within 24 hrs to avoid $75 fee. Interpreter requested via email/Thai C to interpret for this appointment.    Standing Status:   Future    Standing Expiration Date:   01/10/2022    Scheduling Instructions:     First available    Order Specific Question:   Preferred imaging location?    Answer:   [place] ordered this encounter  Medications  . albuterol (VENTOLIN HFA) 108 (90 Base) MCG/ACT inhaler    Sig: Inhale 2 puffs into the lungs every 6 (six) hours as needed for wheezing or shortness of breath.    Dispense:  1 each    Refill:  5  . fluticasone-salmeterol (ADVAIR HFA) 115-21 MCG/ACT inhaler    Sig: Inhale 2 puffs into the lungs 2 (two) times daily.    Dispense:  1 each    Refill:  6    Return for after PFTs.  I have spent a total  time of 45-minutes on the day of the appointment reviewing prior documentation, coordinating care and discussing medical diagnosis and plan with the patient/family. Imaging, labs and tests included in this note have been reviewed and interpreted independently by me.  [place], MD [facility] 01/10/2021 11:18 AM  Office Number [phone]

## 2021-01-10 NOTE — Patient Instructions (Addendum)
Mild Persistent Asthma --Advair TWO puffs TWO times daily. This is your EVERYDAY medicine --Albuterol TWO puffs AS NEEDED. This is your EMERGENCY inhaler --Will arrange Pulmonary Function Test  Pleural Effusion/Scarring --Will obtain CT Chest without contrast  Follow-up with me after PFTs

## 2021-01-24 DIAGNOSIS — J4530 Mild persistent asthma, uncomplicated: Secondary | ICD-10-CM | POA: Insufficient documentation

## 2021-01-28 ENCOUNTER — Other Ambulatory Visit: Payer: Medicaid Other

## 2021-02-13 ENCOUNTER — Other Ambulatory Visit: Payer: Self-pay | Admitting: Orthopedic Surgery

## 2021-02-14 ENCOUNTER — Other Ambulatory Visit: Payer: Self-pay | Admitting: Pulmonary Disease

## 2021-02-14 ENCOUNTER — Other Ambulatory Visit (HOSPITAL_COMMUNITY)
Admission: RE | Admit: 2021-02-14 | Discharge: 2021-02-14 | Disposition: A | Discharge status: Home / Self Care | Payer: Medicaid Other | Source: Ambulatory Visit | Attending: Pulmonary Disease | Admitting: Pulmonary Disease

## 2021-02-14 DIAGNOSIS — J4530 Mild persistent asthma, uncomplicated: Secondary | ICD-10-CM

## 2021-02-14 DIAGNOSIS — Z20822 Contact with and (suspected) exposure to covid-19: Secondary | ICD-10-CM | POA: Diagnosis not present

## 2021-02-14 DIAGNOSIS — Z01812 Encounter for preprocedural laboratory examination: Secondary | ICD-10-CM | POA: Diagnosis present

## 2021-02-14 LAB — SARS CORONAVIRUS 2 (TAT 6-24 HRS): SARS Coronavirus 2: NEGATIVE

## 2021-02-15 ENCOUNTER — Other Ambulatory Visit: Payer: Medicaid Other

## 2021-02-17 ENCOUNTER — Encounter: Payer: Self-pay | Admitting: Pulmonary Disease

## 2021-02-17 ENCOUNTER — Ambulatory Visit (INDEPENDENT_AMBULATORY_CARE_PROVIDER_SITE_OTHER): Payer: Medicaid Other | Admitting: Pulmonary Disease

## 2021-02-17 ENCOUNTER — Other Ambulatory Visit: Payer: Self-pay

## 2021-02-17 VITALS — BP 124/80 | HR 62 | Temp 98.3°F | Ht 65.0 in | Wt 161.5 lb

## 2021-02-17 DIAGNOSIS — J4530 Mild persistent asthma, uncomplicated: Secondary | ICD-10-CM | POA: Diagnosis not present

## 2021-02-17 LAB — PULMONARY FUNCTION TEST
DL/VA % pred: 110 %
DL/VA: 4.74 ml/min/mmHg/L
DLCO cor % pred: 82 %
DLCO cor: 21.14 ml/min/mmHg
DLCO unc % pred: 82 %
DLCO unc: 21.14 ml/min/mmHg
FEF 25-75 Post: 2.12 L/sec
FEF 25-75 Pre: 2.2 L/sec
FEF2575-%Change-Post: -3 %
FEF2575-%Pred-Post: 80 %
FEF2575-%Pred-Pre: 82 %
FEV1-%Change-Post: 0 %
FEV1-%Pred-Post: 91 %
FEV1-%Pred-Pre: 91 %
FEV1-Post: 2.68 L
FEV1-Pre: 2.67 L
FEV1FVC-%Change-Post: 2 %
FEV1FVC-%Pred-Pre: 95 %
FEV6-%Change-Post: -2 %
FEV6-Post: 3.41 L
FEV6-Pre: 3.49 L
FEV6FVC-%Change-Post: 0 %
FVC-%Change-Post: -2 %
FVC-%Pred-Post: 93 %
FVC-%Pred-Pre: 95 %
FVC-Post: 3.41 L
FVC-Pre: 3.5 L
Post FEV1/FVC ratio: 79 %
Post FEV6/FVC ratio: 100 %
Pre FEV1/FVC ratio: 76 %
Pre FEV6/FVC Ratio: 100 %
RV % pred: 59 %
RV: 1.14 L
TLC % pred: 74 %
TLC: 4.43 L

## 2021-02-17 MED ORDER — ADVAIR HFA 115-21 MCG/ACT IN AERO: 2.0000 | INHALATION_SPRAY | Freq: Two times a day (BID) | RESPIRATORY_TRACT | 11 refills | Status: DC

## 2021-02-17 MED ORDER — ALBUTEROL SULFATE HFA 108 (90 BASE) MCG/ACT IN AERS: 2.0000 | INHALATION_SPRAY | Freq: Four times a day (QID) | RESPIRATORY_TRACT | 11 refills | Status: AC | PRN

## 2021-02-17 NOTE — Patient Instructions (Addendum)
Mild Persistent Asthma --CONTINUE Advair TWO puffs TWO times daily. This is your EVERYDAY medicine --CONTINUE Albuterol TWO puffs AS NEEDED. This is your EMERGENCY inhaler  Pleural Effusion/Scarring Chest imaging from 2011-2018 with chronic left pleural parenchymal scarring likely related to prior pleural effusion. Will obtain imaging to ensure no progression of ILD or scarring --Scheduled CT Chest without contrast on 02/28/21 --I will call you with your results   Follow-up in one year

## 2021-02-17 NOTE — Progress Notes (Signed)
PFT done today. 

## 2021-02-17 NOTE — Progress Notes (Signed)
Subjective:   PATIENT ID: Eduardo West GENDER: male DOB: 1961-06-22, MRN: [id number]   HPI  Chief Complaint  Patient presents with  . Follow-up    Pt here to review PFT results from today.  Interpreter was used today.     Reason for Visit: Follow-up  Eduardo West is a 60 year old former smoker with history pulmonary embolism who presents for shortness of breath. Translator present  Since our last visit, he was started on Advair and PRN albuterol. He reports that he has been compliant with his inhalers. He uses the albuterol once a day for shortness of breath which is improved. Dyspnea occurs with exertion worsened by talking sometimes. Denies wheezing or cough. No longer having symptoms at night.  Social History: Previously smoked 20 years for 1/2 ppd. Quit 11 years in 2011. Previously worked as a Psychologist, sport and exercise, Emergency planning/management officer, Research scientist (medical)  I have personally reviewed patient's past medical/family/social history/allergies/current medications.  Past Medical History:  Diagnosis Date  . Abdominal pain   . Buzzing in ear   . Chronic diarrhea 03/04/2012  . Colon polyp   . Diarrhea   . Diverticulitis of cecum 12/2009   Resected 2011:  COLON, SEGMENTAL RESECTION, RIGHT AND TERMINAL ILEUM : - SEGMENT OF COLON  . Hypertension   . Kidney stones   . Pleural effusion, left   . 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 Right leg numbness      Family History  Problem Relation Age of Onset  . Cancer Mother      Social History   Occupational History  . Occupation: unemployed  Tobacco Use  . Smoking status: Former Smoker    Packs/day: 0.50    Years: 20.00    Pack years: 10.00    Quit date: 11/30/2009    Years since quitting: 11.2  . Smokeless tobacco: Never Used  Substance and Sexual Activity  . Alcohol use: No  . Drug use: No  . Sexual activity: Not on file    No Known Allergies   Outpatient Medications Prior to Visit  Medication Sig Dispense Refill  . aspirin EC 81 MG tablet Take 81 mg by  mouth daily.    [name] Kitchen atorvastatin (LIPITOR) 40 MG tablet TAKE 1 TABLET BY MOUTH EVERY DAY 90 tablet 1  . azelastine (ASTELIN) 0.1 % nasal spray Place into both nostrils 2 (two) times daily. Use in each nostril as directed    . Cholecalciferol (VITAMIN D) 50 MCG (2000 UT) tablet Take 2,000 Units by mouth daily.    . fesoterodine (TOVIAZ) 4 MG TB24 tablet Take 4 mg by mouth every evening.    . flecainide (TAMBOCOR) 100 MG tablet TAKE 1 TABLET BY MOUTH TWICE DAILY 180 tablet 1  . HYDROcodone-acetaminophen (NORCO) 10-325 MG tablet Take 1 tablet by mouth every 6 (six) hours as needed. Unsure of dose    . ibuprofen (ADVIL,MOTRIN) 200 MG tablet Take 400 mg by mouth every 6 (six) hours as needed for headache or moderate pain.    [name] Kitchen lisinopril (PRINIVIL,ZESTRIL) 20 MG tablet TAKE 1 TABLET BY MOUTH EVERY DAY 90 tablet 3  . tamsulosin (FLOMAX) 0.4 MG CAPS capsule Take 0.4 mg by mouth every evening.   11  . albuterol (VENTOLIN HFA) 108 (90 Base) MCG/ACT inhaler Inhale 2 puffs into the lungs every 6 (six) hours as needed for wheezing or shortness of breath. 1 each 5  . fluticasone-salmeterol (ADVAIR HFA) 115-21 MCG/ACT inhaler Inhale 2 puffs into the lungs 2 (two) times  daily. 1 each 6  . Tiotropium Bromide Monohydrate (SPIRIVA RESPIMAT) 1.25 MCG/ACT AERS Inhale 2 puffs into the lungs daily. (Patient not taking: Reported on 02/17/2021) 1 Inhaler 5   No facility-administered medications prior to visit.    Review of Systems  Constitutional: Negative for chills, diaphoresis, fever, malaise/fatigue and weight loss.  HENT: Negative for congestion.   Respiratory: Positive for shortness of breath. Negative for cough, hemoptysis, sputum production and wheezing.   Cardiovascular: Negative for chest pain, palpitations and leg swelling.   Objective:   Vitals:   02/17/21 1109  BP: 124/80  Pulse: 62  Temp: 98.3 F (36.8 C)  TempSrc: Tympanic  SpO2: 96%  Weight: 161 lb 8 oz (73.3 kg)  Height: 5\' 5"  (1.651 m)    SpO2: 96 %  Physical Exam: General: Well-appearing, no acute distress HENT: [place], AT Eyes: EOMI, no scleral icterus Respiratory: Clear to auscultation bilaterally.  No crackles, wheezing or rales Cardiovascular: RRR, -M/R/G, no JVD Extremities:-Edema,-tenderness Neuro: AAO x4, CNII-XII grossly intact Skin: Intact, no rashes or bruising Psych: Normal mood, normal affect  Data Reviewed:  Imaging: CXR 11/12/11 - Small loculated left pleural effusion with basilar scarring CXR 02/04/12 - Tiny left pleural effusion. Unchanged left basilar scarring CXR 04/04/12 - Left blunting of costophrenic angle CT A/P 10/10/12 - Bibasilar pleural effusions. Left basilar pleural thickening CT A/P 01/24/14 - Similar left basilar thickening. SBO. S/p right colectomy CT HR Chest 01/13/17 - Pleural parenchymal scarring  PFT: 07/26/17  FVC 3.62 (97%) FEV1 2.81 (93%) Ratio 70  TLC 74% DLCO 87% Interpretation: Mixed mild obstructive and restrictive defect. No significant bronchodilator response  02/17/21 FVC 3.41 (93%) FEV1 2.67 (91%) Ratio 76  TLC 74% DLCO 82% Interpretation: Mild restrictive defect. No significant bronchodilator response  Labs: CBC    Component Value Date/Time   WBC 7.4 06/25/2017 1323   RBC 3.88 (L) 06/25/2017 1323   HGB 12.3 (L) 06/25/2017 1323   HCT 36.6 (L) 06/25/2017 1323   PLT 275 06/25/2017 1323   MCV 94.3 06/25/2017 1323   MCH 31.7 06/25/2017 1323   MCHC 33.6 06/25/2017 1323   RDW 12.5 06/25/2017 1323   LYMPHSABS 1.4 12/05/2014 1025   MONOABS 0.4 12/05/2014 1025   EOSABS 0.3 12/05/2014 1025   BASOSABS 0.0 12/05/2014 1025  Absolute eos 12/05/14 - 300  Imaging, labs and test noted above have been reviewed independently by me.    Assessment & Plan:   Discussion: 60 year old male with prior pleural effusion in 2011 which self-resolved who presents with shortness of breath on exertion. Prior PFTs demonstrated mixed obstructive and restrictive defect. Reviewed PFTs today with  stable FEV1. Mild restrictive defect present.   We discussed his diagnosis, severity, clinical course and management of asthma including during exacerbations. Addressed questions and concerns.  Mild Persistent Asthma - improved --CONTINUE Advair TWO puffs TWO times daily. This is your EVERYDAY medicine --CONTINUE Albuterol TWO puffs AS NEEDED. This is your EMERGENCY inhaler --Keep up to date on vaccinations  Pleural Effusion/Scarring Chest imaging from 2011-2018 with chronic left pleural parenchymal scarring likely related to prior pleural effusion. Will obtain imaging to ensure no progression of ILD or scarring --Scheduled CT Chest without contrast on 02/28/21 --I will call you with your results  Health Maintenance Immunization History  Administered Date(s) Administered  . Influenza Inj Mdck Quad Pf 11/15/2018  . Influenza, High Dose Seasonal PF 10/01/2020  . Influenza,inj,Quad PF,6+ Mos 09/30/2016  . Influenza-Unspecified 08/14/2017  . Moderna SARS-COV2 Booster Vaccination 10/01/2020  .  Moderna Sars-Covid-2 Vaccination 02/20/2020, 03/19/2020   CT Lung Screen - not indicated  No orders of the defined types were placed in this encounter.  Meds ordered this encounter  Medications  . albuterol (VENTOLIN HFA) 108 (90 Base) MCG/ACT inhaler    Sig: Inhale 2 puffs into the lungs every 6 (six) hours as needed for wheezing or shortness of breath.    Dispense:  1 each    Refill:  11  . fluticasone-salmeterol (ADVAIR HFA) 115-21 MCG/ACT inhaler    Sig: Inhale 2 puffs into the lungs 2 (two) times daily.    Dispense:  1 each    Refill:  11    Return in about 1 year (around 02/17/2022).  I have spent a total time of 31-minutes on the day of the appointment reviewing prior documentation, coordinating care and discussing medical diagnosis and plan with the patient/family. Imaging, labs and tests included in this note have been reviewed and interpreted independently by me.  [place],  MD [facility] 02/17/2021 11:34 AM  Office Number [phone]

## 2021-02-28 ENCOUNTER — Other Ambulatory Visit: Payer: Self-pay

## 2021-02-28 ENCOUNTER — Ambulatory Visit
Admission: RE | Admit: 2021-02-28 | Discharge: 2021-02-28 | Disposition: A | Discharge status: Home / Self Care | Payer: Medicaid Other | Source: Ambulatory Visit | Attending: Pulmonary Disease | Admitting: Pulmonary Disease

## 2021-02-28 DIAGNOSIS — Z8709 Personal history of other diseases of the respiratory system: Secondary | ICD-10-CM

## 2021-04-29 ENCOUNTER — Other Ambulatory Visit: Payer: Self-pay | Admitting: Internal Medicine

## 2021-07-01 ENCOUNTER — Ambulatory Visit: Payer: Medicaid Other | Admitting: Podiatry

## 2021-07-01 ENCOUNTER — Other Ambulatory Visit: Payer: Self-pay

## 2021-07-01 ENCOUNTER — Ambulatory Visit (INDEPENDENT_AMBULATORY_CARE_PROVIDER_SITE_OTHER): Payer: Medicaid Other

## 2021-07-01 DIAGNOSIS — M722 Plantar fascial fibromatosis: Secondary | ICD-10-CM

## 2021-07-01 DIAGNOSIS — M79672 Pain in left foot: Secondary | ICD-10-CM

## 2021-07-01 MED ORDER — METHYLPREDNISOLONE 4 MG PO TBPK: ORAL_TABLET | ORAL | 0 refills | Status: DC

## 2021-07-01 NOTE — Progress Notes (Signed)
  Subjective:  Patient ID: Nhut Naqvi, male    DOB: 1961-09-24,  MRN: [id number]  Chief Complaint  Patient presents with   Foot Pain    Left foot pain    60 y.o. male presents with the above complaint. History confirmed with patient.  This is on the bottom of the heel.  Its hard to walk on.  It hurts when he gets up.  He has taken medications for this over-the-counter and his PCP told him to come see [place] after was not helping  Objective:  Physical Exam: warm, good capillary refill, no trophic changes or ulcerative lesions, normal DP and PT pulses, and normal sensory exam. Left Foot: point tenderness over the heel pad  Radiographs: Multiple views x-ray of the left foot: no fracture, dislocation, swelling or degenerative changes noted, plantar calcaneal spur, and pes cavus Assessment:   1. Plantar fasciitis, left      Plan:  Patient was evaluated and treated and all questions answered.  Discussed the etiology and treatment options for plantar fasciitis including stretching, formal physical therapy, supportive shoegears such as a running shoe or sneaker, pre fabricated orthoses, injection therapy, and oral medications. We also discussed the role of surgical treatment of this for patients who do not improve after exhausting non-surgical treatment options.   -XR reviewed with patient -Educated patient on stretching and icing of the affected limb -Injection delivered to the plantar fascia of the left foot. -Rx for medrol pack. Educated on use, risks, and benefits of the medication   Return in about 6 weeks (around 08/12/2021) for recheck plantar fasciitis.

## 2021-07-01 NOTE — Patient Instructions (Signed)

## 2021-07-02 ENCOUNTER — Telehealth: Payer: Self-pay | Admitting: Podiatry

## 2021-07-02 MED ORDER — METHYLPREDNISOLONE 4 MG PO TBPK
ORAL_TABLET | ORAL | 0 refills | Status: DC
Start: 2021-07-02 — End: 2021-08-26

## 2021-07-02 NOTE — Addendum Note (Signed)
Addended bySherryle Lis, Teryn Gust R on: 07/02/2021 04:04 PM   Modules accepted: Orders

## 2021-07-02 NOTE — Telephone Encounter (Signed)
Please resend dose pack for patient-[facility] did not receive it.

## 2021-07-02 NOTE — Telephone Encounter (Signed)
Re-sent to  [facility], [address]  [place], [place] [address]  Phone:  [phone] Fax:  [phone]

## 2021-07-15 ENCOUNTER — Other Ambulatory Visit: Payer: Self-pay | Admitting: Podiatry

## 2021-07-15 DIAGNOSIS — M722 Plantar fascial fibromatosis: Secondary | ICD-10-CM

## 2021-07-28 ENCOUNTER — Other Ambulatory Visit: Payer: Self-pay | Admitting: Internal Medicine

## 2021-08-26 ENCOUNTER — Ambulatory Visit (INDEPENDENT_AMBULATORY_CARE_PROVIDER_SITE_OTHER): Payer: Medicaid Other | Admitting: Podiatry

## 2021-08-26 ENCOUNTER — Other Ambulatory Visit: Payer: Self-pay

## 2021-08-26 DIAGNOSIS — M722 Plantar fascial fibromatosis: Secondary | ICD-10-CM

## 2021-08-26 MED ORDER — METHYLPREDNISOLONE 4 MG PO TBPK
ORAL_TABLET | ORAL | 0 refills | Status: DC
Start: 2021-08-26 — End: 2022-03-18

## 2021-08-27 NOTE — Progress Notes (Signed)
  Subjective:  Patient ID: Eduardo West, male    DOB: 08/30/1961,  MRN: [id number]  Chief Complaint  Patient presents with   Plantar Fasciitis      recheck plantar fasciitis left    60 y.o. male returns for follow-up with the above complaint. History confirmed with patient.  Has had quite a bit of improvement after the last injection and methylprednisolone taper.  More pain is on the lateral side of the foot now  Objective:  Physical Exam: warm, good capillary refill, no trophic changes or ulcerative lesions, normal DP and PT pulses, and normal sensory exam. Left Foot: point tenderness over the heel pad mostly lateral  Radiographs: Multiple views x-ray of the left foot: no fracture, dislocation, swelling or degenerative changes noted, plantar calcaneal spur, and pes cavus Assessment:   1. Plantar fasciitis, left      Plan:  Patient was evaluated and treated and all questions answered.  Discussed the etiology and treatment options for plantar fasciitis including stretching, formal physical therapy, supportive shoegears such as a running shoe or sneaker, pre fabricated orthoses, injection therapy, and oral medications. We also discussed the role of surgical treatment of this for patients who do not improve after exhausting non-surgical treatment options.   -XR reviewed with patient -Educated patient on stretching and icing of the affected limb -Injection delivered to the plantar fascia of the left foot. -Rx for medrol pack. Educated on use, risks, and benefits of the medication  After sterile prep with povidone-iodine solution and alcohol, the left heel was injected with 0.5cc 2% xylocaine plain, 0.5cc 0.5% marcaine plain, 5mg  triamcinolone acetonide, and 2mg  dexamethasone was injected along the lateral plantar fascia at the insertion on the plantar calcaneus. The patient tolerated the procedure well without complication.  Return if symptoms worsen or fail to improve.

## 2021-09-29 ENCOUNTER — Other Ambulatory Visit: Payer: Self-pay

## 2021-09-29 ENCOUNTER — Encounter: Payer: Self-pay | Admitting: Internal Medicine

## 2021-09-29 ENCOUNTER — Ambulatory Visit: Payer: Medicaid Other | Admitting: Internal Medicine

## 2021-09-29 VITALS — BP 136/92 | HR 75 | Ht 65.0 in | Wt 164.0 lb

## 2021-09-29 DIAGNOSIS — E785 Hyperlipidemia, unspecified: Secondary | ICD-10-CM | POA: Diagnosis not present

## 2021-09-29 DIAGNOSIS — Z5181 Encounter for therapeutic drug level monitoring: Secondary | ICD-10-CM

## 2021-09-29 DIAGNOSIS — I10 Essential (primary) hypertension: Secondary | ICD-10-CM | POA: Diagnosis not present

## 2021-09-29 DIAGNOSIS — Z79899 Other long term (current) drug therapy: Secondary | ICD-10-CM

## 2021-09-29 DIAGNOSIS — I498 Other specified cardiac arrhythmias: Secondary | ICD-10-CM

## 2021-09-29 MED ORDER — FLECAINIDE ACETATE 100 MG PO TABS: 100.0000 mg | ORAL_TABLET | Freq: Two times a day (BID) | ORAL | 3 refills | Status: DC

## 2021-09-29 MED ORDER — ATORVASTATIN CALCIUM 40 MG PO TABS
40.0000 mg | ORAL_TABLET | Freq: Every day | ORAL | 3 refills | Status: DC
Start: 2021-09-29 — End: 2022-08-24

## 2021-09-29 NOTE — Progress Notes (Signed)
OFFICE NOTE  Chief Complaint:  Follow-up palpitations  Primary Care Physician: Rogers Blocker, MD  HPI:  Eduardo West is a pleasant 60 year old [name] he is male who is seen today with a Optometrist. He is referred by Dr. Jimmye Norman for evaluation of an abnormal heart rhythm. EKG today shows ventricular bigeminy with ST and T wave abnormalities laterally concerning for ischemia. Eduardo West reports some increasing chest discomfort with exertion as well as shortness of breath. He says that he typically walks 30-45 minutes almost every day but recently has had worsening shortness of breath and his fatigue after only 5-10 minutes. He says that his chest discomfort is worse when exerting himself and released somewhat by rest. There is no significant family history of heart disease per his report although hypertension is his family. He was recently hospitalized for small bowel obstruction and has improved. He has had partial colon resection as well as appendectomy in the past. In January 2015 while hospitalized he was seen for chest pain by Dr. Pernell Dupre. Echocardiogram was recommended which showed an EF of 60-65% with mild diastolic dysfunction and moderate concentric LVH. There were no segmental wall motion abnormalities. He reports his symptoms have progressively gotten worse since his recent hospitalization.  Eduardo West returns for follow-up. At his last office visit I started him on low-dose beta blocker and he remains in ventricular bigeminy today. The electrical rate is 68 however his pulse rate is half of that at 34. He cannot express whether he has more fatigue or chest discomfort on the medicine than he had before. He certainly does not feel in any improvement on the beta blocker. Pulse rate may be slower.  I had the pleasure seeing Eduardo West back in the office today. In the interim he seen Dr. Cristopher Peru twice, who started him on flecainide and increased dose to 100 mg twice a day. This seems  to have been effective in suppressing his PVCs. He does feel somewhat better and has more energy and is less short of breath.  I saw Eduardo West back in the office today with an interpreter. Overall he is doing fairly well. He is currently on flecainide 100 mg twice a day. He reports nearly complete resolution of his palpitations. His EKG shows no evidence of bigeminy. He says he gets occasional sharp chest pain but no significant symptoms that sound anginal. He also has some mild shortness of breath. He underwent a stress test last year which was negative for ischemia. Blood pressure appears well controlled on Dyazide.  10/28/2016  Eduardo West returns today for follow-up. He is accompanied by a [place] interpreter. Overall he feels well. He denies any significant palpitations. He has been on flecainide 100 mg twice a day. QTc interval is 467 ms today with EKG demonstrating sinus rhythm and first-degree AV block at 212 ms. He recently was in the hospital for abdominal pain and was given some hydrocodone with improvement. The etiology was unclear. He's not had any significant palpitations. He denies any chest pain. He had a stress test in April of this year which was nonischemic. He will need another stress test in 2 years for monitoring of flecainide therapy.  08/03/2017  Eduardo West returns today for follow-up. Recently he was seen by Almyra Deforest, PA-C for episodes of chest pain at rest and with exertion. Amlodipine was added for antianginal benefit however he says that he feels dizzy on this medication. Blood pressure is low normal today at 112/88.  I suspect he may be getting hypotensive with it. He does not feel that it's improved his chest discomfort. It is also notable that he is not taking aspirin daily. He was supposed to be on this medicine. I went over this with him in the interpreter today and provided samples of 81 mg aspirin. He should take this daily. This is a treatment for coronary  disease.  11/11/2017  Eduardo West returns today for follow-up.  He is accompanied by an interpreter.  When I last saw him we readjusted his medications, namely we discontinued his amlodipine.  He was having problems with swelling and he had no improvement in his chest discomfort.  At times he was also having some hypotension.  Blood pressure was retested today and was initially elevated 152/90 however recheck came back at 124/80 in the left arm and 130/88 in the right arm.  He remains on flecainide and has had no recurrent atrial fibrillation.  EKG today shows sinus rhythm with a QTC of 425 ms.  11/11/2018  Eduardo West is seen today in follow-up with the [place] translator.  Symptomatically he is doing well.  Blood pressure seems to be still elevated.  He denies any palpitations and is tolerating his flecainide.  His last stress test was 2 years ago.  EKG shows sinus rhythm with first-degree AV block and QTC of 360 ms.  He denies chest pain or worsening shortness of breath.  12/21/2018  Eduardo West is seen today in follow-up.  He is accompanied by [place].  Symptom wise he denies any palpitations.  He feels like he is doing well on the flecainide.  He does get some chest discomfort but only when he is under stress or if he is arguing with his teenage daughters.  Apparently he has 3 daughters at home.  He did undergo a stress test which was routine for monitoring on flecainide.  This did not demonstrate any ischemic EKG changes nor any QRS widening associated with the flecainide.  08/02/2020  Eduardo West returns today for follow-up.  Overall he is doing well.  He is again accompanied by by a [place]. translator.  He denies any chest pain or shortness of breath.  He has near complete resolution of his PVCs.  Overall he is tolerating the flecainide well.  He had stress testing last year which was negative for ischemia.  09/29/2021  Eduardo West returns today for follow-up.  He was seen  with a professional [place].  He denies any chest pain or worsening shortness of breath.  He reports his palpitations are well controlled.  He says he is close to running out of his flecainide and needs refills of that and his Lipitor.  His last cholesterol check however was in 2019.  He is compliant with the medicine but has not been rechecked to my knowledge.  He does have a PCP who is monitoring and refilling his blood pressure medicine.  Blood pressure today was 136/92.  EKG shows sinus rhythm at 75 with a QTC of 435 ms.  He underwent treadmill exercise stress testing last year which was negative for ischemia.  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 Marlou Starks:  Attempted laparoscopic and subsequent open appendectomy   BIOPSY  10/21/2018   Procedure: BIOPSY;  Surgeon: Leighton Ruff, MD;  Location: [facility];  Service: Endoscopy;;   CHOLECYSTECTOMY  12/2009   Dr. Excell Seltzer   COLON SURGERY     COLONOSCOPY N/A 10/21/2018   Procedure: COLONOSCOPY ERAS PATHWAY;  Surgeon: Leighton Ruff, MD;  Location: [facility];  Service: Endoscopy;  Laterality: N/A;   FLEXIBLE SIGMOIDOSCOPY N/A 09/13/2013   Procedure: FLEXIBLE SIGMOIDOSCOPY;  Surgeon: Leighton Ruff, MD;  Location: [facility];  Service: Endoscopy;  Laterality: N/A;   RIGHT COLECTOMY  12/2009   Dr Excell Seltzer: open resection for cecal diverticulitis    FAMHx:  Family History  Problem Relation Age of Onset   Cancer Mother     SOCHx:   reports that he quit smoking about 11 years ago. He has a 10.00 pack-year smoking  history. He has never used smokeless tobacco. He reports that he does not drink alcohol and does not use drugs.  ALLERGIES:  No Known Allergies  ROS: Pertinent items noted in HPI and remainder of comprehensive ROS otherwise negative.  HOME MEDS: Current Outpatient Medications  Medication Sig Dispense Refill   albuterol (VENTOLIN HFA) 108 (90 Base) MCG/ACT inhaler Inhale 2 puffs into the lungs every 6 (six) hours as needed for wheezing or shortness of breath. 1 each 11   aspirin EC 81 MG tablet Take 81 mg by mouth daily.     atorvastatin (LIPITOR) 40 MG tablet TAKE 1 TABLET BY MOUTH EVERY DAY 90 tablet 0   azelastine (ASTELIN) 0.1 % nasal spray Place into both nostrils 2 (two) times daily. Use in each nostril as directed     Cholecalciferol (VITAMIN D) 50 MCG (2000 UT) tablet Take 2,000 Units by mouth daily.     fesoterodine (TOVIAZ) 4 MG TB24 tablet Take 4 mg by mouth every evening.     flecainide (TAMBOCOR) 100 MG tablet TAKE 1 TABLET BY MOUTH TWICE DAILY 180 tablet 0   fluticasone-salmeterol (ADVAIR HFA) 115-21 MCG/ACT inhaler Inhale 2 puffs into the lungs 2 (two) times daily. 1 each 11   HYDROcodone-acetaminophen (NORCO) 10-325 MG tablet Take 1 tablet by mouth every 6 (six) hours as needed. Unsure of dose     ibuprofen (ADVIL,MOTRIN) 200 MG tablet Take 400 mg by mouth every 6 (six) hours as needed for headache or moderate pain.     lisinopril (PRINIVIL,ZESTRIL) 20 MG tablet TAKE 1 TABLET BY MOUTH EVERY DAY 90 tablet 3   methylPREDNISolone (MEDROL DOSEPAK) 4 MG TBPK tablet 6 day dose pack - take as directed 21 tablet 0   tamsulosin (FLOMAX) 0.4 MG CAPS capsule Take 0.4 mg by mouth every evening.   11   No current facility-administered medications for this visit.    LABS/IMAGING: No results found for this or any previous visit (from the past 48 hour(s)). No results found.  VITALS: BP (!) 136/92 (BP Location: Right Arm, Patient Position: Sitting, Cuff Size: Normal)   Pulse 75   Ht  5\' 5"  (1.651 m)   Wt 164 lb (74.4 kg)   BMI 27.29 kg/m   EXAM: General appearance: alert and no distress Neck: no carotid bruit, no JVD, and thyroid not enlarged, symmetric, no tenderness/mass/nodules Lungs: clear to auscultation bilaterally Heart:  regular rate and rhythm, S1, S2 normal, no murmur, click, rub or gallop Abdomen: soft, non-tender; bowel sounds normal; no masses,  no organomegaly Extremities: extremities normal, atraumatic, no cyanosis or edema Pulses: 2+ and symmetric Skin: Skin color, texture, turgor normal. No rashes or lesions Neurologic: Grossly normal Psych: Pleasant  EKG: Normal sinus rhythm at 75, QTC 435 ms-personally reviewed  ASSESSMENT: Ventricular bigeminy with nonconducted PVCs - suppressed on flecainide Chest pain with exertion - low risk exercise stress test (2021) Dyspnea on exertion - improved Hypertension Aortic atherosclerosis  PLAN: 1.   Mr. Carcione continues to do well on flecainide without any significant recurrent palpitations.  EKG is sinus today without any PVCs.  QTC is stable and QRST is normal.  He had a negative exercise treadmill stress test last year.  Would recommend we repeat that next year prior to follow-up.  No other medicine changes today.  I refilled his flecainide and atorvastatin.  Continue on lisinopril for hypertension as per his PCP.  Pixie Casino, MD, [facility], [place] Director of the Advanced Lipid Disorders &  Cardiovascular Risk Reduction Clinic Attending Cardiologist  Direct Dial: [phone]  Fax: [phone]  Website:  [email or web address] 09/29/2021, 10:58 AM

## 2021-09-29 NOTE — Patient Instructions (Signed)
Medication Instructions:  NO CHANGES  *If you need a refill on your cardiac medications before your next appointment, please call your pharmacy*   Lab Work: CMET, lipid panel today   If you have labs (blood work) drawn today and your tests are completely normal, you will receive your results only by: [place] (if you have MyChart) OR A paper copy in the mail If you have any lab test that is abnormal or we need to change your treatment, we will call you to review the results.   Testing/Procedures: Exercise Tolerance Test (stress test) due 08/2022 (before your next appointment)   Follow-Up: At [facility], you and your health needs are our priority.  As part of our continuing mission to provide you with exceptional heart care, we have created designated Provider Care Teams.  These Care Teams include your primary Cardiologist (physician) and Advanced Practice Providers (APPs -  Physician Assistants and Nurse Practitioners) who all work together to provide you with the care you need, when you need it.  We recommend signing up for the patient portal called "MyChart".  Sign up information is provided on this After Visit Summary.  MyChart is used to connect with patients for Virtual Visits (Telemedicine).  Patients are able to view lab/test results, encounter notes, upcoming appointments, etc.  Non-urgent messages can be sent to your provider as well.   To learn more about what you can do with MyChart, go to [email or web address].    Your next appointment:   12 month(s)  The format for your next appointment:   In Person  Provider:   You may see Dr. Debara Pickett or one of the following Advanced Practice Providers on your designated Care Team:   Almyra Deforest, PA-C Fabian Sharp, [place] or  Roby Lofts, [place]   Other Instructions

## 2021-09-30 LAB — COMPREHENSIVE METABOLIC PANEL
ALT: 22 IU/L (ref 0–44)
AST: 23 IU/L (ref 0–40)
Albumin/Globulin Ratio: 2 (ref 1.2–2.2)
Albumin: 5 g/dL — ABNORMAL HIGH (ref 3.8–4.9)
Alkaline Phosphatase: 90 IU/L (ref 44–121)
BUN/Creatinine Ratio: 12 (ref 9–20)
BUN: 15 mg/dL (ref 6–24)
Bilirubin Total: 1.7 mg/dL — ABNORMAL HIGH (ref 0.0–1.2)
CO2: 23 mmol/L (ref 20–29)
Calcium: 9.9 mg/dL (ref 8.7–10.2)
Chloride: 101 mmol/L (ref 96–106)
Creatinine, Ser: 1.22 mg/dL (ref 0.76–1.27)
Globulin, Total: 2.5 g/dL (ref 1.5–4.5)
Glucose: 96 mg/dL (ref 70–99)
Potassium: 4.1 mmol/L (ref 3.5–5.2)
Sodium: 140 mmol/L (ref 134–144)
Total Protein: 7.5 g/dL (ref 6.0–8.5)
eGFR: 69 mL/min/{1.73_m2} (ref >59)

## 2021-09-30 LAB — LIPID PANEL
Chol/HDL Ratio: 2.1 ratio (ref 0.0–5.0)
Cholesterol, Total: 140 mg/dL (ref 100–199)
HDL: 67 mg/dL (ref >39)
LDL Chol Calc (NIH): 56 mg/dL (ref 0–99)
Triglycerides: 90 mg/dL (ref 0–149)
VLDL Cholesterol Cal: 17 mg/dL (ref 5–40)

## 2022-03-18 ENCOUNTER — Other Ambulatory Visit: Payer: Self-pay

## 2022-03-18 ENCOUNTER — Inpatient Hospital Stay (HOSPITAL_COMMUNITY): Payer: Medicaid Other

## 2022-03-18 ENCOUNTER — Emergency Department (HOSPITAL_COMMUNITY): Payer: Medicaid Other

## 2022-03-18 ENCOUNTER — Inpatient Hospital Stay (HOSPITAL_COMMUNITY)
Admission: EM | Admit: 2022-03-18 | Discharge: 2022-03-20 | DRG: 390 | Disposition: A | Discharge status: Home / Self Care | Payer: Medicaid Other | Attending: Internal Medicine | Admitting: Internal Medicine

## 2022-03-18 ENCOUNTER — Encounter (HOSPITAL_COMMUNITY): Payer: Self-pay

## 2022-03-18 DIAGNOSIS — Z87891 Personal history of nicotine dependence: Secondary | ICD-10-CM

## 2022-03-18 DIAGNOSIS — E876 Hypokalemia: Secondary | ICD-10-CM | POA: Diagnosis present

## 2022-03-18 DIAGNOSIS — Z7982 Long term (current) use of aspirin: Secondary | ICD-10-CM

## 2022-03-18 DIAGNOSIS — Z9049 Acquired absence of other specified parts of digestive tract: Secondary | ICD-10-CM | POA: Diagnosis not present

## 2022-03-18 DIAGNOSIS — Z7951 Long term (current) use of inhaled steroids: Secondary | ICD-10-CM | POA: Diagnosis not present

## 2022-03-18 DIAGNOSIS — K56609 Unspecified intestinal obstruction, unspecified as to partial versus complete obstruction: Secondary | ICD-10-CM | POA: Diagnosis not present

## 2022-03-18 DIAGNOSIS — E785 Hyperlipidemia, unspecified: Secondary | ICD-10-CM | POA: Diagnosis present

## 2022-03-18 DIAGNOSIS — I10 Essential (primary) hypertension: Secondary | ICD-10-CM | POA: Diagnosis present

## 2022-03-18 DIAGNOSIS — Z603 Acculturation difficulty: Secondary | ICD-10-CM | POA: Diagnosis present

## 2022-03-18 DIAGNOSIS — Z789 Other specified health status: Secondary | ICD-10-CM | POA: Diagnosis not present

## 2022-03-18 DIAGNOSIS — Z79899 Other long term (current) drug therapy: Secondary | ICD-10-CM | POA: Diagnosis not present

## 2022-03-18 DIAGNOSIS — I498 Other specified cardiac arrhythmias: Secondary | ICD-10-CM | POA: Diagnosis not present

## 2022-03-18 DIAGNOSIS — R008 Other abnormalities of heart beat: Secondary | ICD-10-CM | POA: Diagnosis present

## 2022-03-18 LAB — CBC WITH DIFFERENTIAL/PLATELET
Abs Immature Granulocytes: 0.03 10*3/uL (ref 0.00–0.07)
Basophils Absolute: 0 10*3/uL (ref 0.0–0.1)
Basophils Relative: 0 %
Eosinophils Absolute: 0.3 10*3/uL (ref 0.0–0.5)
Eosinophils Relative: 3 %
HCT: 39.4 % (ref 39.0–52.0)
Hemoglobin: 13 g/dL (ref 13.0–17.0)
Immature Granulocytes: 0 %
Lymphocytes Relative: 11 %
Lymphs Abs: 1 10*3/uL (ref 0.7–4.0)
MCH: 32.2 pg (ref 26.0–34.0)
MCHC: 33 g/dL (ref 30.0–36.0)
MCV: 97.5 fL (ref 80.0–100.0)
Monocytes Absolute: 0.5 10*3/uL (ref 0.1–1.0)
Monocytes Relative: 6 %
Neutro Abs: 7.1 10*3/uL (ref 1.7–7.7)
Neutrophils Relative %: 80 %
Platelets: 351 10*3/uL (ref 150–400)
RBC: 4.04 MIL/uL — ABNORMAL LOW (ref 4.22–5.81)
RDW: 11.9 % (ref 11.5–15.5)
WBC: 8.9 10*3/uL (ref 4.0–10.5)
nRBC: 0 % (ref 0.0–0.2)

## 2022-03-18 LAB — COMPREHENSIVE METABOLIC PANEL
ALT: 17 U/L (ref 0–44)
AST: 20 U/L (ref 15–41)
Albumin: 3.8 g/dL (ref 3.5–5.0)
Alkaline Phosphatase: 75 U/L (ref 38–126)
Anion gap: 7 (ref 5–15)
BUN: 13 mg/dL (ref 6–20)
CO2: 27 mmol/L (ref 22–32)
Calcium: 8.8 mg/dL — ABNORMAL LOW (ref 8.9–10.3)
Chloride: 103 mmol/L (ref 98–111)
Creatinine, Ser: 1.16 mg/dL (ref 0.61–1.24)
GFR, Estimated: >60 mL/min (ref >60)
Glucose, Bld: 114 mg/dL — ABNORMAL HIGH (ref 70–99)
Potassium: 3.1 mmol/L — ABNORMAL LOW (ref 3.5–5.1)
Sodium: 137 mmol/L (ref 135–145)
Total Bilirubin: 2.7 mg/dL — ABNORMAL HIGH (ref 0.3–1.2)
Total Protein: 6.7 g/dL (ref 6.5–8.1)

## 2022-03-18 LAB — URINALYSIS, ROUTINE W REFLEX MICROSCOPIC
Bilirubin Urine: NEGATIVE
Glucose, UA: NEGATIVE mg/dL
Hgb urine dipstick: NEGATIVE
Ketones, ur: 5 mg/dL — AB
Leukocytes,Ua: NEGATIVE
Nitrite: NEGATIVE
Protein, ur: NEGATIVE mg/dL
Specific Gravity, Urine: 1.015 (ref 1.005–1.030)
pH: 6 (ref 5.0–8.0)

## 2022-03-18 LAB — LIPASE, BLOOD: Lipase: 43 U/L (ref 11–51)

## 2022-03-18 MED ORDER — ONDANSETRON HCL 4 MG/2ML IJ SOLN
4.0000 mg | Freq: Four times a day (QID) | INTRAMUSCULAR | Status: DC | PRN
Start: 2022-03-18 — End: 2022-03-20

## 2022-03-18 MED ORDER — SODIUM CHLORIDE 0.9 % IV SOLN
8.0000 mg | Freq: Four times a day (QID) | INTRAVENOUS | Status: DC | PRN
  Filled 2022-03-18: qty 4

## 2022-03-18 MED ORDER — HEPARIN SODIUM (PORCINE) 5000 UNIT/ML IJ SOLN
5000.0000 [IU] | Freq: Three times a day (TID) | INTRAMUSCULAR | Status: DC
  Administered 2022-03-18 – 2022-03-20 (×7): 5000 [IU] via SUBCUTANEOUS
  Filled 2022-03-18 (×7): qty 1

## 2022-03-18 MED ORDER — ALUM & MAG HYDROXIDE-SIMETH 200-200-20 MG/5ML PO SUSP
30.0000 mL | Freq: Four times a day (QID) | ORAL | Status: DC | PRN
Start: 2022-03-18 — End: 2022-03-20

## 2022-03-18 MED ORDER — LACTATED RINGERS IV BOLUS
1000.0000 mL | Freq: Three times a day (TID) | INTRAVENOUS | Status: AC | PRN
Start: 2022-03-18 — End: 2022-03-20

## 2022-03-18 MED ORDER — MENTHOL 3 MG MT LOZG
1.0000 | LOZENGE | OROMUCOSAL | Status: DC | PRN
Start: 2022-03-18 — End: 2022-03-20
  Filled 2022-03-18: qty 9

## 2022-03-18 MED ORDER — PANTOPRAZOLE SODIUM 40 MG IV SOLR
40.0000 mg | INTRAVENOUS | Status: DC
  Administered 2022-03-18: 40 mg via INTRAVENOUS
  Filled 2022-03-18: qty 10

## 2022-03-18 MED ORDER — BISACODYL 10 MG RE SUPP
10.0000 mg | Freq: Every day | RECTAL | Status: DC
  Administered 2022-03-18 – 2022-03-20 (×3): 10 mg via RECTAL
  Filled 2022-03-18 (×3): qty 1

## 2022-03-18 MED ORDER — SODIUM CHLORIDE (PF) 0.9 % IJ SOLN
INTRAMUSCULAR | Status: AC
  Filled 2022-03-18: qty 50

## 2022-03-18 MED ORDER — PHENOL 1.4 % MT LIQD
1.0000 | OROMUCOSAL | Status: DC | PRN
  Filled 2022-03-18: qty 177

## 2022-03-18 MED ORDER — IOHEXOL 300 MG/ML  SOLN
100.0000 mL | Freq: Once | INTRAMUSCULAR | Status: AC | PRN
  Administered 2022-03-18: 100 mL via INTRAVENOUS

## 2022-03-18 MED ORDER — HYDROMORPHONE HCL 1 MG/ML IJ SOLN: 0.5000 mg | INTRAMUSCULAR | Status: DC | PRN

## 2022-03-18 MED ORDER — POTASSIUM CHLORIDE 10 MEQ/100ML IV SOLN
10.0000 meq | INTRAVENOUS | Status: AC
  Administered 2022-03-18 (×3): 10 meq via INTRAVENOUS
  Filled 2022-03-18 (×3): qty 100

## 2022-03-18 MED ORDER — PROCHLORPERAZINE EDISYLATE 10 MG/2ML IJ SOLN: 5.0000 mg | INTRAMUSCULAR | Status: DC | PRN

## 2022-03-18 MED ORDER — ACETAMINOPHEN 650 MG RE SUPP: 650.0000 mg | RECTAL | Status: DC | PRN

## 2022-03-18 MED ORDER — ONDANSETRON HCL 4 MG/2ML IJ SOLN
4.0000 mg | Freq: Once | INTRAMUSCULAR | Status: AC
  Administered 2022-03-18: 4 mg via INTRAVENOUS
  Filled 2022-03-18: qty 2

## 2022-03-18 MED ORDER — LACTATED RINGERS IV SOLN: INTRAVENOUS | Status: DC

## 2022-03-18 MED ORDER — SIMETHICONE 40 MG/0.6ML PO SUSP
80.0000 mg | Freq: Four times a day (QID) | ORAL | Status: DC | PRN
  Filled 2022-03-18: qty 1.2

## 2022-03-18 MED ORDER — METHOCARBAMOL 1000 MG/10ML IJ SOLN
1000.0000 mg | Freq: Four times a day (QID) | INTRAVENOUS | Status: DC | PRN
  Filled 2022-03-18: qty 10

## 2022-03-18 MED ORDER — HYDROMORPHONE HCL 1 MG/ML IJ SOLN
0.5000 mg | INTRAMUSCULAR | Status: DC | PRN
  Administered 2022-03-18: 1 mg via INTRAVENOUS
  Filled 2022-03-18: qty 1

## 2022-03-18 MED ORDER — DIPHENHYDRAMINE HCL 50 MG/ML IJ SOLN: 12.5000 mg | Freq: Four times a day (QID) | INTRAMUSCULAR | Status: DC | PRN

## 2022-03-18 MED ORDER — LIP MEDEX EX OINT
1.0000 "application " | TOPICAL_OINTMENT | Freq: Two times a day (BID) | CUTANEOUS | Status: DC
  Administered 2022-03-18 – 2022-03-20 (×5): 1 via TOPICAL
  Filled 2022-03-18: qty 14
  Filled 2022-03-18 (×2): qty 7

## 2022-03-18 MED ORDER — HYDROMORPHONE HCL 1 MG/ML IJ SOLN
1.0000 mg | Freq: Once | INTRAMUSCULAR | Status: AC
  Administered 2022-03-18: 1 mg via INTRAVENOUS
  Filled 2022-03-18: qty 1

## 2022-03-18 MED ORDER — DIATRIZOATE MEGLUMINE & SODIUM 66-10 % PO SOLN
90.0000 mL | Freq: Once | ORAL | Status: AC
  Administered 2022-03-18: 90 mL via NASOGASTRIC
  Filled 2022-03-18: qty 90

## 2022-03-18 MED ORDER — ONDANSETRON HCL 4 MG/2ML IJ SOLN: 4.0000 mg | Freq: Four times a day (QID) | INTRAMUSCULAR | Status: DC | PRN

## 2022-03-18 MED ORDER — LACTATED RINGERS IV BOLUS
1000.0000 mL | Freq: Once | INTRAVENOUS | Status: AC
Start: 2022-03-18 — End: 2022-03-18
  Administered 2022-03-18: 1000 mL via INTRAVENOUS

## 2022-03-18 MED ORDER — MAGIC MOUTHWASH
15.0000 mL | Freq: Four times a day (QID) | ORAL | Status: DC | PRN
Start: 2022-03-18 — End: 2022-03-20
  Filled 2022-03-18: qty 15

## 2022-03-18 MED ORDER — PANTOPRAZOLE SODIUM 40 MG IV SOLR
40.0000 mg | Freq: Two times a day (BID) | INTRAVENOUS | Status: DC
  Administered 2022-03-18 – 2022-03-20 (×5): 40 mg via INTRAVENOUS
  Filled 2022-03-18 (×5): qty 10

## 2022-03-18 MED ORDER — MAGNESIUM SULFATE 2 GM/50ML IV SOLN
2.0000 g | Freq: Once | INTRAVENOUS | Status: AC
Start: 2022-03-18 — End: 2022-03-18
  Administered 2022-03-18: 2 g via INTRAVENOUS
  Filled 2022-03-18: qty 50

## 2022-03-18 MED ORDER — POTASSIUM CHLORIDE IN NACL 40-0.9 MEQ/L-% IV SOLN
INTRAVENOUS | Status: DC
Start: 2022-03-18 — End: 2022-03-20
  Filled 2022-03-18 (×7): qty 1000

## 2022-03-18 MED ORDER — LABETALOL HCL 5 MG/ML IV SOLN
10.0000 mg | Freq: Four times a day (QID) | INTRAVENOUS | Status: DC | PRN
  Administered 2022-03-18 (×2): 10 mg via INTRAVENOUS
  Filled 2022-03-18 (×2): qty 4

## 2022-03-18 NOTE — ED Notes (Signed)
NG tube placed without difficulty.  Patient able to speak after placement.  Xray ordered for verification ?

## 2022-03-18 NOTE — H&P (Signed)
?History and Physical  ? ? ?Eduardo West ZOX:[id number] DOB: 10/17/1961 DOA: 03/18/2022 ? ?DOS: the patient was seen and examined on 03/18/2022 ? ?PCP: Rogers Blocker, MD  ? ?Patient coming from: Home ? ?I have personally briefly reviewed patient's old medical records in [place] ? ?CC: abd pain, nausea ?HPI: ?61 year old [place] male with a history of prior bowel obstruction status post partial colectomy in 2011, hypertension, history of ventricular bigeminy presents to the ER today with about a 24-hour history of abdominal pain.  Abdominal pain started yesterday around 7 AM.  His last bowel movement around 7 PM.  He has not had any vomiting but has had continued nausea.  Pain is increased throughout the day.  Presented to the ER with worsening pain. ? ?On arrival temp 98.1 heart rate 64 blood pressure 156/101. ? ?Labs sodium 137, potassium 3.1, BUN 13, creatinine 1.1 ? ?Lipase normal at 43 ? ?White count 8.9, hemoglobin 13, platelets 351 ? ?CT abdomen pelvis with contrast showed small bowel obstruction with a transition point near the anterior lower abdomen. ? ?EDP is contacted general surgery. ? ?[facility] hospitalist contacted for admission.  ? ?ED Course: CT abd shows SBO ? ?Review of Systems:  ?Review of Systems  ?Constitutional: Negative.   ?HENT: Negative.    ?Eyes: Negative.   ?Respiratory: Negative.    ?Cardiovascular: Negative.   ?Gastrointestinal:  Positive for abdominal pain and nausea. Negative for vomiting.  ?Genitourinary: Negative.   ?Musculoskeletal: Negative.   ?Skin: Negative.   ?Neurological: Negative.   ?Endo/Heme/Allergies: Negative.   ?Psychiatric/Behavioral: Negative.    ?All other systems reviewed and are negative. ? ?Past Medical History:  ?Diagnosis Date  ? Abdominal pain   ? Buzzing in ear   ? Chronic diarrhea 03/04/2012  ? Colon polyp   ? Diarrhea   ? Diverticulitis of cecum 12/2009  ? Resected 2011:  COLON, SEGMENTAL RESECTION, RIGHT AND TERMINAL ILEUM : - SEGMENT OF COLON  ?  Hypertension   ? Kidney stones   ? Pleural effusion, left   ? 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 Right leg numbness   ? ? ?Past Surgical History:  ?Procedure Laterality Date  ? APPENDECTOMY  11/30/2001  ? Dr. Marlou Starks:  Attempted laparoscopic and subsequent open appendectomy  ? BIOPSY  10/21/2018  ? Procedure: BIOPSY;  Surgeon: Leighton Ruff, MD;  Location: [facility];  Service: Endoscopy;;  ? CHOLECYSTECTOMY  12/31/2009  ? Dr. Excell Seltzer  ? COLON SURGERY  01/23/2010  ? partial colectomy  ? COLONOSCOPY N/A 10/21/2018  ? Procedure: COLONOSCOPY ERAS PATHWAY;  Surgeon: Leighton Ruff, MD;  Location: [facility];  Service: Endoscopy;  Laterality: N/A;  ? FLEXIBLE SIGMOIDOSCOPY N/A 09/13/2013  ? Procedure: FLEXIBLE SIGMOIDOSCOPY;  Surgeon: Leighton Ruff, MD;  Location: [facility];  Service: Endoscopy;  Laterality: N/A;  ? RIGHT COLECTOMY  12/31/2009  ? Dr Excell Seltzer: open resection for cecal diverticulitis  ? ? ? reports that he quit smoking about 12 years ago. He has a 10.00 pack-year smoking history. He has never used smokeless tobacco. He reports that he does not drink alcohol and does not use drugs. ? ?No Known Allergies ? ?Family History  ?Problem Relation Age of Onset  ? Cancer Mother   ? ? ?Prior to Admission medications   ?Medication Sig Start Date End Date Taking? Authorizing Provider  ?albuterol (VENTOLIN HFA) 108 (90 Base) MCG/ACT inhaler Inhale 2 puffs into the lungs every 6 (six) hours as needed for wheezing or shortness of breath. 02/17/21  Margaretha Seeds, MD  ?aspirin EC 81 MG tablet Take 81 mg by mouth daily.    [provider]  ?atorvastatin (LIPITOR) 40 MG tablet Take 1 tablet (40 mg total) by mouth daily. 09/29/21   [name], Nadean Corwin, MD  ?azelastine (ASTELIN) 0.1 % nasal spray Place into both nostrils 2 (two) times daily. Use in each nostril as directed    [provider]  ?Cholecalciferol (VITAMIN D) 50 MCG (2000 UT) tablet Take 2,000 Units by mouth daily.    [provider]   ?fesoterodine (TOVIAZ) 4 MG TB24 tablet Take 4 mg by mouth every evening.    [provider]  ?flecainide (TAMBOCOR) 100 MG tablet Take 1 tablet (100 mg total) by mouth 2 (two) times daily. 09/29/21   Pixie Casino, MD  ?fluticasone-salmeterol (ADVAIR HFA) 888-28 MCG/ACT inhaler Inhale 2 puffs into the lungs 2 (two) times daily. 02/17/21   Margaretha Seeds, MD  ?HYDROcodone-acetaminophen [facility]) 10-325 MG tablet Take 1 tablet by mouth every 6 (six) hours as needed. Unsure of dose    [provider]  ?ibuprofen (ADVIL,MOTRIN) 200 MG tablet Take 400 mg by mouth every 6 (six) hours as needed for headache or moderate pain.    [provider]  ?lisinopril (PRINIVIL,ZESTRIL) 20 MG tablet TAKE 1 TABLET BY MOUTH EVERY DAY 09/20/17   [name], Nadean Corwin, MD  ?methylPREDNISolone (MEDROL DOSEPAK) 4 MG TBPK tablet 6 day dose pack - take as directed 08/26/21   Criselda Peaches, DPM  ?tamsulosin (FLOMAX) 0.4 MG CAPS capsule Take 0.4 mg by mouth every evening.  01/29/15   [provider]  ? ? ?Physical Exam: ?Vitals:  ? 03/18/22 0100 03/18/22 0104 03/18/22 0200 03/18/22 0300  ?BP: (!) 156/101  132/88 (!) 175/100  ?Pulse: 64  63 63  ?Resp: '16  14 10  '$ ?Temp:  98.1 ?F (36.7 ?C)    ?TempSrc:  Oral    ?SpO2: 99%  98% 99%  ? ? ?Physical Exam ?Vitals and nursing note reviewed.  ?Constitutional:   ?   General: He is not in acute distress. ?   Appearance: Normal appearance. He is normal weight. He is not ill-appearing, toxic-appearing or diaphoretic.  ?HENT:  ?   Head: Normocephalic and atraumatic.  ?   Nose: Nose normal.  ?Eyes:  ?   General: No scleral icterus. ?Cardiovascular:  ?   Rate and Rhythm: Normal rate and regular rhythm.  ?   Pulses: Normal pulses.  ?Pulmonary:  ?   Effort: Pulmonary effort is normal. No respiratory distress.  ?   Breath sounds: Normal breath sounds. No wheezing or rales.  ?Abdominal:  ?   General: Bowel sounds are decreased.  ?   Palpations: Abdomen is soft.  ?   Tenderness:  There is no guarding or rebound.  ?   Comments: No pain with palpation ? ?Well healed vertical incision on midline of abd.  ?Musculoskeletal:  ?   Right lower leg: No edema.  ?   Left lower leg: No edema.  ?Skin: ?   General: Skin is warm and dry.  ?   Capillary Refill: Capillary refill takes less than 2 seconds.  ?Neurological:  ?   General: No focal deficit present.  ?  ? ?Labs on Admission: I have personally reviewed following labs and imaging studies ? ?CBC: ?Recent Labs  ?Lab 03/18/22 ?0111  ?WBC 8.9  ?NEUTROABS 7.1  ?HGB 13.0  ?HCT 39.4  ?MCV 97.5  ?PLT 351  ? ?  Basic Metabolic Panel: ?Recent Labs  ?Lab 03/18/22 ?0111  ?NA 137  ?K 3.1*  ?CL 103  ?CO2 27  ?GLUCOSE 114*  ?BUN 13  ?CREATININE 1.16  ?CALCIUM 8.8*  ? ?GFR: ?CrCl cannot be calculated (Unknown ideal weight.). ?Liver Function Tests: ?Recent Labs  ?Lab 03/18/22 ?0111  ?AST 20  ?ALT 17  ?ALKPHOS 75  ?BILITOT 2.7*  ?PROT 6.7  ?ALBUMIN 3.8  ? ?Recent Labs  ?Lab 03/18/22 ?0111  ?LIPASE 43  ? ?No results for input(s): AMMONIA in the last 168 hours. ?Coagulation Profile: ?No results for input(s): INR, PROTIME in the last 168 hours. ?Cardiac Enzymes: ?No results for input(s): CKTOTAL, CKMB, CKMBINDEX, TROPONINI, TROPONINIHS in the last 168 hours. ?BNP (last 3 results) ?No results for input(s): PROBNP in the last 8760 hours. ?HbA1C: ?No results for input(s): HGBA1C in the last 72 hours. ?CBG: ?No results for input(s): GLUCAP in the last 168 hours. ?Lipid Profile: ?No results for input(s): CHOL, HDL, LDLCALC, TRIG, CHOLHDL, LDLDIRECT in the last 72 hours. ?Thyroid Function Tests: ?No results for input(s): TSH, T4TOTAL, FREET4, T3FREE, THYROIDAB in the last 72 hours. ?Anemia Panel: ?No results for input(s): VITAMINB12, FOLATE, FERRITIN, TIBC, IRON, RETICCTPCT in the last 72 hours. ?Urine analysis: ?   ?Component Value Date/Time  ? [place] YELLOW 03/18/2022 0209  ? APPEARANCEUR CLEAR 03/18/2022 0209  ? LABSPEC 1.015 03/18/2022 0209  ? PHURINE 6.0 03/18/2022  0209  ? [place] NEGATIVE 03/18/2022 0209  ? [place] NEGATIVE 03/18/2022 0209  ? [place] NEGATIVE 03/18/2022 0209  ? KETONESUR 5 (A) 03/18/2022 0209  ? [place] NEGATIVE 03/18/2022 0209  ? UROBILINOGEN 0.2 10/10/2012 2101

## 2022-03-18 NOTE — Assessment & Plan Note (Signed)
On po flecainide at home. Will need to hold this. ?

## 2022-03-18 NOTE — Subjective & Objective (Signed)
CC: abd pain, nausea ?HPI: ?61 year old [place] male with a history of prior bowel obstruction status post partial colectomy in 2011, hypertension, history of ventricular bigeminy presents to the ER today with about a 24-hour history of abdominal pain.  Abdominal pain started yesterday around 7 AM.  His last bowel movement around 7 PM.  He has not had any vomiting but has had continued nausea.  Pain is increased throughout the day.  Presented to the ER with worsening pain. ? ?On arrival temp 98.1 heart rate 64 blood pressure 156/101. ? ?Labs sodium 137, potassium 3.1, BUN 13, creatinine 1.1 ? ?Lipase normal at 43 ? ?White count 8.9, hemoglobin 13, platelets 351 ? ?CT abdomen pelvis with contrast showed small bowel obstruction with a transition point near the anterior lower abdomen. ? ?EDP is contacted general surgery. ? ?[facility] hospitalist contacted for admission. ?

## 2022-03-18 NOTE — Assessment & Plan Note (Signed)
Will need to hold ACEI due to NG tube. Can add prn IV labetalol. ?

## 2022-03-18 NOTE — Assessment & Plan Note (Signed)
Admit to med/surg. EDP has consulted general surgery. Continue with IVF. NG tube to LWS. Prn dilaudid for pain. ?

## 2022-03-18 NOTE — ED Triage Notes (Signed)
Pt [place]-speaking, in from home via [facility] with LLQ pain. Hx of appendectomy and bowel blockages. Limited report, but wife is to arrive soon and is [place] speaking. Temp 98.1, VSS en route with EMS ?

## 2022-03-18 NOTE — Plan of Care (Signed)

## 2022-03-18 NOTE — ED Triage Notes (Signed)
Daughter arrives, states pt woke up with abdominal pain at 0700 yesterday. Pain worsened throughout day, and belly is more distended now. Nausea, but no emesis ?

## 2022-03-18 NOTE — ED Notes (Signed)
Patient resting quietly at this time.

## 2022-03-18 NOTE — ED Notes (Signed)
Bladder scanned pt, pt has 56 mL of urine retain in bladder. ?

## 2022-03-18 NOTE — ED Notes (Signed)
Patient taken to CT at this time.

## 2022-03-18 NOTE — Progress Notes (Signed)
Patient admitted early this morning for abdominal pain and nausea secondary to small bowel obstruction and currently has NG tube.  I have consulted general surgery.  Follow recommendations.  Patient seen and examined at bedside and plan of care discussed with him.  I have reviewed patient's medical records including this morning's H&P, current vitals, labs, medications myself.  Continue n.p.o., NG tube, IV fluids and IV analgesics as needed. ?

## 2022-03-18 NOTE — Consult Note (Signed)
? ? ? ?Eduardo West ?[birth year]  ?[id number].   ? ?Requesting MD: Dr. Reola Mosher ?Chief Complaint/Reason for Consult: SBO ? ?HPI: Eduardo West is a 61 y.o. male who presented to the ED on 4/19 for abdominal pain. Hx obtained via patient w/ vietnamese interpreter. Patient reports at 7am yesterday he began having intermittent, sharp, generalized abdominal pain with associated nausea. Reports he was able to have a small bm around 3pm and pass flatus till 5pm but has not since. Developed emesis x 2 later in the evening prompting his visit to the ED. Since arrival he has been afebrile without tachycardia or hypotension. WBC wnl. CT w/ SBO with transition in the anterior lower abdomen. Also showed thickened appearance of the distal stomach in the region of the gastric antrum and pylorus which may be reactive to the ascites or represent gastritis. NGT was placed w/ ~300cc bilous return. Since placement patient reports pain and distension have improved. No further nausea. Still no flatus or bm since yesterday. He has a hx of open appendectomy, Cholecystectomy, and R colectomy. He reports 2 episodes (2013 and 2015) of prior sbo's resolved with conservative management. Takes a baby asa daily - no other blood thinners.  ? ?ROS: ?Review of Systems  ?Constitutional:  Negative for fever.  ?Gastrointestinal:  Positive for abdominal pain, nausea and vomiting.  ? ?Family History  ?Problem Relation Age of Onset  ? Cancer Mother   ? ? ?Past Medical History:  ?Diagnosis Date  ? Abdominal pain   ? Buzzing in ear   ? Chronic diarrhea 03/04/2012  ? Colon polyp   ? Diarrhea   ? Diverticulitis of cecum 12/2009  ? Resected 2011:  COLON, SEGMENTAL RESECTION, RIGHT AND TERMINAL ILEUM : - SEGMENT OF COLON  ? Hypertension   ? Kidney stones   ? Pleural effusion, left   ? 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 Right leg numbness   ? ? ?Past Surgical History:  ?Procedure Laterality Date  ? APPENDECTOMY  11/30/2001  ? Dr. Marlou Starks:  Attempted laparoscopic and subsequent  open appendectomy  ? BIOPSY  10/21/2018  ? Procedure: BIOPSY;  Surgeon: Leighton Ruff, MD;  Location: [facility];  Service: Endoscopy;;  ? CHOLECYSTECTOMY  12/31/2009  ? Dr. Excell Seltzer  ? COLON SURGERY  01/23/2010  ? partial colectomy  ? COLONOSCOPY N/A 10/21/2018  ? Procedure: COLONOSCOPY ERAS PATHWAY;  Surgeon: Leighton Ruff, MD;  Location: [facility];  Service: Endoscopy;  Laterality: N/A;  ? FLEXIBLE SIGMOIDOSCOPY N/A 09/13/2013  ? Procedure: FLEXIBLE SIGMOIDOSCOPY;  Surgeon: Leighton Ruff, MD;  Location: [facility];  Service: Endoscopy;  Laterality: N/A;  ? RIGHT COLECTOMY  12/31/2009  ? Dr Excell Seltzer: open resection for cecal diverticulitis  ? ? ?Social History:  reports that he quit smoking about 12 years ago. He has a 10.00 pack-year smoking history. He has never used smokeless tobacco. He reports that he does not drink alcohol and does not use drugs. ? ?Allergies: No Known Allergies ? ?(Not in a hospital admission) ? ? ? ?Physical Exam: ?Blood pressure (!) 151/97, pulse 64, temperature 98.1 ?F (36.7 ?C), temperature source Oral, resp. rate 16, SpO2 97 %. ?General: pleasant, WD/WN male who is laying in bed in NAD ?HEENT: head is normocephalic, atraumatic.  Sclera are noninjected.  PERRL.  Ears and nose without any masses or lesions.  Mouth is pink and moist. Dentition fair ?Heart: regular, rate, and rhythm.  Palpable radial pulses bilaterally  ?Lungs: CTAB, no wheezes, rhonchi, or rales noted.  Respiratory effort nonlabored ?  Abd:  Soft, mild distension, NT on my exam, hypoactive bowel sounds. No masses, hernias, or organomegaly. Prior midline wound  ?MS: no BUE/BLE edema, MAE's ?Skin: warm and dry with no masses, lesions, or rashes ?Psych: A&Ox4 with an appropriate affect ?Neuro: cranial nerves grossly intact, normal speech, thought process intact, moves all extremities, gait not assessed ? ? ?Results for orders placed or performed during the hospital encounter of 03/18/22 (from the past 48 hour(s))   ?CBC with Differential     Status: Abnormal  ? Collection Time: 03/18/22  1:11 AM  ?Result Value Ref Range  ? WBC 8.9 4.0 - 10.5 K/uL  ? RBC 4.04 (L) 4.22 - 5.81 MIL/uL  ? Hemoglobin 13.0 13.0 - 17.0 g/dL  ? HCT 39.4 39.0 - 52.0 %  ? MCV 97.5 80.0 - 100.0 fL  ? MCH 32.2 26.0 - 34.0 pg  ? MCHC 33.0 30.0 - 36.0 g/dL  ? RDW 11.9 11.5 - 15.5 %  ? Platelets 351 150 - 400 K/uL  ? nRBC 0.0 0.0 - 0.2 %  ? Neutrophils Relative % 80 %  ? Neutro Abs 7.1 1.7 - 7.7 K/uL  ? Lymphocytes Relative 11 %  ? Lymphs Abs 1.0 0.7 - 4.0 K/uL  ? Monocytes Relative 6 %  ? Monocytes Absolute 0.5 0.1 - 1.0 K/uL  ? Eosinophils Relative 3 %  ? Eosinophils Absolute 0.3 0.0 - 0.5 K/uL  ? Basophils Relative 0 %  ? Basophils Absolute 0.0 0.0 - 0.1 K/uL  ? Immature Granulocytes 0 %  ? Abs Immature Granulocytes 0.03 0.00 - 0.07 K/uL  ?  Comment: Performed at [facility], [place] [address], [place], [place] [address]  ?Comprehensive metabolic panel     Status: Abnormal  ? Collection Time: 03/18/22  1:11 AM  ?Result Value Ref Range  ? Sodium 137 135 - 145 mmol/L  ? Potassium 3.1 (L) 3.5 - 5.1 mmol/L  ? Chloride 103 98 - 111 mmol/L  ? CO2 27 22 - 32 mmol/L  ? Glucose, Bld 114 (H) 70 - 99 mg/dL  ?  Comment: Glucose reference range applies only to samples taken after fasting for at least 8 hours.  ? BUN 13 6 - 20 mg/dL  ? Creatinine, Ser 1.16 0.61 - 1.24 mg/dL  ? Calcium 8.8 (L) 8.9 - 10.3 mg/dL  ? Total Protein 6.7 6.5 - 8.1 g/dL  ? Albumin 3.8 3.5 - 5.0 g/dL  ? AST 20 15 - 41 U/L  ? ALT 17 0 - 44 U/L  ? Alkaline Phosphatase 75 38 - 126 U/L  ? Total Bilirubin 2.7 (H) 0.3 - 1.2 mg/dL  ? GFR, Estimated >60 >60 mL/min  ?  Comment: (NOTE) ?Calculated using the CKD-EPI Creatinine Equation (2021) ?  ? Anion gap 7 5 - 15  ?  Comment: Performed at [facility], [place] [address] [place], [place] [address]  ?Lipase, blood     Status: None  ? Collection Time: 03/18/22  1:11 AM  ?Result Value Ref Range  ? Lipase 43 11 - 51 U/L  ?   Comment: Performed at [facility], [place] [address], [place], [place] [address]  ?Urinalysis, Routine w reflex microscopic Urine, Clean Catch     Status: Abnormal  ? Collection Time: 03/18/22  2:09 AM  ?Result Value Ref Range  ? Color, Urine YELLOW YELLOW  ? APPearance CLEAR CLEAR  ? Specific Gravity, Urine 1.015 1.005 - 1.030  ? pH 6.0 5.0 -  8.0  ? Glucose, UA NEGATIVE NEGATIVE mg/dL  ? Hgb urine dipstick NEGATIVE NEGATIVE  ? Bilirubin Urine NEGATIVE NEGATIVE  ? Ketones, ur 5 (A) NEGATIVE mg/dL  ? Protein, ur NEGATIVE NEGATIVE mg/dL  ? Nitrite NEGATIVE NEGATIVE  ? Leukocytes,Ua NEGATIVE NEGATIVE  ?  Comment: Performed at [facility], [place] [address], [place], [place] [address]  ? ?DG Abdomen 1 View ? ?Result Date: 03/18/2022 ?CLINICAL DATA:  Check NGT positioning. EXAM: ABDOMEN - 1 VIEW COMPARISON:  CT with IV contrast earlier today showing a small bowel obstruction. FINDINGS: NGT has been inserted and the tip is in the gastric antrum, well placed. No free air is seen. Upper to mid abdominal small bowel dilatation up to 3.2 cm continues to be noted and is unchanged. There is scattered colonic gas seen as far distally as the distal descending colon. Visceral shadows are unchanged. There is contrast in the renal collecting systems and ureters. There are cholecystectomy clips. IMPRESSION: NGT tip is in the gastric antrum with no change in mild small bowel dilatation. Electronically Signed   By: Telford Nab M.D.   On: 03/18/2022 04:17  ? ?CT Abdomen Pelvis W Contrast ? ?Result Date: 03/18/2022 ?CLINICAL DATA:  Abdominal pain. EXAM: CT ABDOMEN AND PELVIS WITH CONTRAST TECHNIQUE: Multidetector CT imaging of the abdomen and pelvis was performed using the standard protocol following bolus administration of intravenous contrast. RADIATION DOSE REDUCTION: This exam was performed according to the departmental dose-optimization program which includes automated exposure control, adjustment  of the mA and/or kV according to patient size and/or use of iterative reconstruction technique. CONTRAST:  112m OMNIPAQUE IOHEXOL 300 MG/ML  SOLN COMPARISON:  CT abdomen pelvis dated 08/20/2016. FINDINGS:

## 2022-03-18 NOTE — Assessment & Plan Note (Signed)
Pt speaks only [place]. ?

## 2022-03-18 NOTE — ED Notes (Signed)
Portable xray at bedside.

## 2022-03-18 NOTE — ED Provider Notes (Signed)
?[facility] DEPT ?Provider Note ? ? ?CSN: [id number] ?Arrival date & time: 03/18/22  0050 ? ?  ? ?History ? ?Chief Complaint  ?Patient presents with  ? Abdominal Pain  ? ? ?Eduardo West is a 61 y.o. male. ? ?61 year old male brought in by EMS from home with complaint of abdominal pain onset 7 AM yesterday, was intermittent however has been constant for the past few hours.  Reports nausea without vomiting.  States he is unable to move his bowels or empty his bladder which is unusual for him as he normally has frequent urination.  Reports prior appendectomy, cholecystectomy, bowel obstruction.  Feels his abdomen is more distended. ?History is provided by patient's daughter at bedside, patient speaks [place], declines formal interpreter. ?Prior medical history includes diverticulitis, hypertension, hyperlipidemia and arrhythmia (daughter states on flecainide and aspirin). ? ? ?  ? ?Home Medications ?Prior to Admission medications   ?Medication Sig Start Date End Date Taking? Authorizing Provider  ?albuterol (VENTOLIN HFA) 108 (90 Base) MCG/ACT inhaler Inhale 2 puffs into the lungs every 6 (six) hours as needed for wheezing or shortness of breath. 02/17/21   Margaretha Seeds, MD  ?aspirin EC 81 MG tablet Take 81 mg by mouth daily.    [provider]  ?atorvastatin (LIPITOR) 40 MG tablet Take 1 tablet (40 mg total) by mouth daily. 09/29/21   [name], Nadean Corwin, MD  ?azelastine (ASTELIN) 0.1 % nasal spray Place into both nostrils 2 (two) times daily. Use in each nostril as directed    [provider]  ?Cholecalciferol (VITAMIN D) 50 MCG (2000 UT) tablet Take 2,000 Units by mouth daily.    [provider]  ?fesoterodine (TOVIAZ) 4 MG TB24 tablet Take 4 mg by mouth every evening.    [provider]  ?flecainide (TAMBOCOR) 100 MG tablet Take 1 tablet (100 mg total) by mouth 2 (two) times daily. 09/29/21   Pixie Casino, MD  ?fluticasone-salmeterol (ADVAIR  HFA) 503-54 MCG/ACT inhaler Inhale 2 puffs into the lungs 2 (two) times daily. 02/17/21   Margaretha Seeds, MD  ?HYDROcodone-acetaminophen [facility]) 10-325 MG tablet Take 1 tablet by mouth every 6 (six) hours as needed. Unsure of dose    [provider]  ?ibuprofen (ADVIL,MOTRIN) 200 MG tablet Take 400 mg by mouth every 6 (six) hours as needed for headache or moderate pain.    [provider]  ?lisinopril (PRINIVIL,ZESTRIL) 20 MG tablet TAKE 1 TABLET BY MOUTH EVERY DAY 09/20/17   [name], Nadean Corwin, MD  ?methylPREDNISolone (MEDROL DOSEPAK) 4 MG TBPK tablet 6 day dose pack - take as directed 08/26/21   Criselda Peaches, DPM  ?tamsulosin (FLOMAX) 0.4 MG CAPS capsule Take 0.4 mg by mouth every evening.  01/29/15   [provider]  ?   ? ?Allergies    ?Patient has no known allergies.   ? ?Review of Systems   ?Review of Systems ?Negative except as per HPI ?Physical Exam ?Updated Vital Signs ?BP (!) 175/100   Pulse 63   Temp 98.1 ?F (36.7 ?C) (Oral)   Resp 10   SpO2 99%  ?Physical Exam ?Vitals and nursing note reviewed.  ?Constitutional:   ?   General: He is not in acute distress. ?   Appearance: He is well-developed. He is not diaphoretic.  ?   Comments: Appears uncomfortable  ?HENT:  ?   Head: Normocephalic and atraumatic.  ?Cardiovascular:  ?   Rate and Rhythm: Normal rate and regular rhythm.  ?  Pulmonary:  ?   Effort: Pulmonary effort is normal.  ?   Breath sounds: Normal breath sounds.  ?Abdominal:  ?   General: A surgical scar is present. Bowel sounds are normal. There is distension.  ?   Palpations: Abdomen is soft.  ?   Tenderness: There is generalized abdominal tenderness.  ?Skin: ?   General: Skin is warm and dry.  ?   Findings: No erythema or rash.  ?Neurological:  ?   Mental Status: He is alert and oriented to person, place, and time.  ?Psychiatric:     ?   Behavior: Behavior normal.  ? ? ?ED Results / Procedures / Treatments   ?Labs ?(all labs ordered are listed, but only abnormal  results are displayed) ?Labs Reviewed  ?CBC WITH DIFFERENTIAL/PLATELET - Abnormal; Notable for the following components:  ?    Result Value  ? RBC 4.04 (*)   ? All other components within normal limits  ?COMPREHENSIVE METABOLIC PANEL - Abnormal; Notable for the following components:  ? Potassium 3.1 (*)   ? Glucose, Bld 114 (*)   ? Calcium 8.8 (*)   ? Total Bilirubin 2.7 (*)   ? All other components within normal limits  ?URINALYSIS, ROUTINE W REFLEX MICROSCOPIC - Abnormal; Notable for the following components:  ? Ketones, ur 5 (*)   ? All other components within normal limits  ?LIPASE, BLOOD  ? ? ?EKG ?None ? ?Radiology ?CT Abdomen Pelvis W Contrast ? ?Result Date: 03/18/2022 ?CLINICAL DATA:  Abdominal pain. EXAM: CT ABDOMEN AND PELVIS WITH CONTRAST TECHNIQUE: Multidetector CT imaging of the abdomen and pelvis was performed using the standard protocol following bolus administration of intravenous contrast. RADIATION DOSE REDUCTION: This exam was performed according to the departmental dose-optimization program which includes automated exposure control, adjustment of the mA and/or kV according to patient size and/or use of iterative reconstruction technique. CONTRAST:  121m OMNIPAQUE IOHEXOL 300 MG/ML  SOLN COMPARISON:  CT abdomen pelvis dated 08/20/2016. FINDINGS: Lower chest: Trace right pleural effusion. Minimal bibasilar dependent atelectasis. The visualized lung bases are otherwise clear. No intra-abdominal free air.  Small perihepatic ascites. Hepatobiliary: Multiple liver cysts measure up to 3.5 cm in the dome of the liver. Additional subcentimeter hypodense lesions are too small to characterize. No intrahepatic biliary dilatation. Cholecystectomy. Pancreas: Unremarkable. No pancreatic ductal dilatation or surrounding inflammatory changes. Spleen: Is 1 Adrenals/Urinary Tract: The adrenal glands unremarkable. There is a 4 mm nonobstructing left renal inferior pole calculus. Vascular calcification versus a 3 mm  nonobstructing right renal upper pole calculus. There is no hydronephrosis on either side. There is bilateral renal cortical irregularity and scarring. Small bilateral renal cysts and subcentimeter hypodense lesions which are too small to characterize. There is symmetric enhancement and excretion of contrast by both kidneys. The visualized ureters appear unremarkable. The urinary bladder is collapsed. Stomach/Bowel: There is scattered distal colonic diverticula without active inflammatory changes. Status post right hemicolectomy with ileocolic anastomosis in the right upper quadrant. There is mild thickened appearance of distal small bowel loops. There is dilatation of the small bowel in the mid abdomen measuring up to 3 cm. A transition is noted in the anterior lower abdomen (axial 65/2 and coronal 22/3). There is thickened appearance of the distal stomach in the region of the gastric antrum and pylorus which may be reactive to the ascites or represent gastritis. Appendectomy. Vascular/Lymphatic: Mild aortoiliac atherosclerotic disease. The IVC is unremarkable. No portal venous gas. There is no adenopathy. Reproductive: The prostate and seminal  vesicles are grossly unremarkable. No pelvic mass. Other: None Musculoskeletal: Degenerative changes of the spine. No acute osseous pathology. IMPRESSION: 1. Small-bowel obstruction with transition in the anterior lower abdomen. 2. Thickened appearance of the distal stomach in the region of the gastric antrum and pylorus which may be reactive to the ascites or represent gastritis. 3. Colonic diverticulosis. 4. Nonobstructing bilateral renal calculi. No hydronephrosis. 5. Aortic Atherosclerosis (ICD10-I70.0). Electronically Signed   By: Anner Crete M.D.   On: 03/18/2022 02:58   ? ?Procedures ?Procedures  ? ? ?Medications Ordered in ED ?Medications  ?sodium chloride (PF) 0.9 % injection (has no administration in time range)  ?ondansetron [facility]) injection 4 mg (4 mg  Intravenous Given 03/18/22 0231)  ?HYDROmorphone (DILAUDID) injection 1 mg (1 mg Intravenous Given 03/18/22 0228)  ?iohexol (OMNIPAQUE) 300 MG/ML solution 100 mL (100 mLs Intravenous Contrast Given 03/18/22 0235)  ? ?

## 2022-03-19 ENCOUNTER — Ambulatory Visit: Payer: Medicaid Other | Admitting: Pulmonary Disease

## 2022-03-19 ENCOUNTER — Encounter (HOSPITAL_COMMUNITY): Payer: Self-pay | Admitting: Internal Medicine

## 2022-03-19 DIAGNOSIS — K56609 Unspecified intestinal obstruction, unspecified as to partial versus complete obstruction: Secondary | ICD-10-CM | POA: Diagnosis not present

## 2022-03-19 DIAGNOSIS — I10 Essential (primary) hypertension: Secondary | ICD-10-CM | POA: Diagnosis not present

## 2022-03-19 DIAGNOSIS — E876 Hypokalemia: Secondary | ICD-10-CM

## 2022-03-19 DIAGNOSIS — I498 Other specified cardiac arrhythmias: Secondary | ICD-10-CM | POA: Diagnosis not present

## 2022-03-19 LAB — BASIC METABOLIC PANEL
Anion gap: 7 (ref 5–15)
BUN: 11 mg/dL (ref 6–20)
CO2: 23 mmol/L (ref 22–32)
Calcium: 8.2 mg/dL — ABNORMAL LOW (ref 8.9–10.3)
Chloride: 110 mmol/L (ref 98–111)
Creatinine, Ser: 1.13 mg/dL (ref 0.61–1.24)
GFR, Estimated: >60 mL/min (ref >60)
Glucose, Bld: 90 mg/dL (ref 70–99)
Potassium: 3.9 mmol/L (ref 3.5–5.1)
Sodium: 140 mmol/L (ref 135–145)

## 2022-03-19 LAB — MAGNESIUM: Magnesium: 2.3 mg/dL (ref 1.7–2.4)

## 2022-03-19 NOTE — Progress Notes (Addendum)
? ? ?   ?Subjective: ?Patient feels well this morning with no pain.  Had a BM. ? ?ROS: See above, otherwise other systems negative ? ?Objective: ?Vital signs in last 24 hours: ?Temp:  [98 ?F (36.7 ?C)-98.2 ?F (36.8 ?C)] 98 ?F (36.7 ?C) (04/19 2350) ?Pulse Rate:  [64-79] 72 (04/19 2350) ?Resp:  [12-20] 20 (04/19 2350) ?BP: (103-186)/(73-118) 148/82 (04/19 2350) ?SpO2:  [95 %-99 %] 96 % (04/19 2350) ?Last BM Date : 03/18/22 ? ?Intake/Output from previous day: ?04/19 0701 - 04/20 0700 ?In: 3346.8 [I.V.:2200.1; IV Piggyback:1146.7] ?Out: 1200 [Urine:1200] ?Intake/Output this shift: ?Total I/O ?In: -  ?Out: 200 [Urine:200] ? ?PE: ?Abd: soft, NT, ND, +BS, NGT with minimal output currently ? ?Lab Results:  ?Recent Labs  ?  03/18/22 ?0111  ?WBC 8.9  ?HGB 13.0  ?HCT 39.4  ?PLT 351  ? ?BMET ?Recent Labs  ?  03/18/22 ?0111  ?NA 137  ?K 3.1*  ?CL 103  ?CO2 27  ?GLUCOSE 114*  ?BUN 13  ?CREATININE 1.16  ?CALCIUM 8.8*  ? ?PT/INR ?No results for input(s): LABPROT, INR in the last 72 hours. ?CMP  ?   ?Component Value Date/Time  ? NA 137 03/18/2022 0111  ? NA 140 09/29/2021 1120  ? K 3.1 (L) 03/18/2022 0111  ? CL 103 03/18/2022 0111  ? CO2 27 03/18/2022 0111  ? GLUCOSE 114 (H) 03/18/2022 0111  ? BUN 13 03/18/2022 0111  ? BUN 15 09/29/2021 1120  ? CREATININE 1.16 03/18/2022 0111  ? CREATININE 1.20 04/23/2016 1029  ? CALCIUM 8.8 (L) 03/18/2022 0111  ? PROT 6.7 03/18/2022 0111  ? PROT 7.5 09/29/2021 1120  ? ALBUMIN 3.8 03/18/2022 0111  ? ALBUMIN 5.0 (H) 09/29/2021 1120  ? AST 20 03/18/2022 0111  ? ALT 17 03/18/2022 0111  ? ALKPHOS 75 03/18/2022 0111  ? BILITOT 2.7 (H) 03/18/2022 0111  ? BILITOT 1.7 (H) 09/29/2021 1120  ? GFRNONAA >60 03/18/2022 0111  ? GFRAA 81 11/18/2018 1151  ? ?Lipase  ?   ?Component Value Date/Time  ? LIPASE 43 03/18/2022 0111  ? ? ? ? ? ?Studies/Results: ?DG Abdomen 1 View ? ?Result Date: 03/18/2022 ?CLINICAL DATA:  Check NGT positioning. EXAM: ABDOMEN - 1 VIEW COMPARISON:  CT with IV contrast earlier today showing  a small bowel obstruction. FINDINGS: NGT has been inserted and the tip is in the gastric antrum, well placed. No free air is seen. Upper to mid abdominal small bowel dilatation up to 3.2 cm continues to be noted and is unchanged. There is scattered colonic gas seen as far distally as the distal descending colon. Visceral shadows are unchanged. There is contrast in the renal collecting systems and ureters. There are cholecystectomy clips. IMPRESSION: NGT tip is in the gastric antrum with no change in mild small bowel dilatation. Electronically Signed   By: Telford Nab M.D.   On: 03/18/2022 04:17  ? ?CT Abdomen Pelvis W Contrast ? ?Result Date: 03/18/2022 ?CLINICAL DATA:  Abdominal pain. EXAM: CT ABDOMEN AND PELVIS WITH CONTRAST TECHNIQUE: Multidetector CT imaging of the abdomen and pelvis was performed using the standard protocol following bolus administration of intravenous contrast. RADIATION DOSE REDUCTION: This exam was performed according to the departmental dose-optimization program which includes automated exposure control, adjustment of the mA and/or kV according to patient size and/or use of iterative reconstruction technique. CONTRAST:  177m OMNIPAQUE IOHEXOL 300 MG/ML  SOLN COMPARISON:  CT abdomen pelvis dated 08/20/2016. FINDINGS: Lower chest: Trace right pleural effusion. Minimal bibasilar dependent atelectasis.  The visualized lung bases are otherwise clear. No intra-abdominal free air.  Small perihepatic ascites. Hepatobiliary: Multiple liver cysts measure up to 3.5 cm in the dome of the liver. Additional subcentimeter hypodense lesions are too small to characterize. No intrahepatic biliary dilatation. Cholecystectomy. Pancreas: Unremarkable. No pancreatic ductal dilatation or surrounding inflammatory changes. Spleen: Is 1 Adrenals/Urinary Tract: The adrenal glands unremarkable. There is a 4 mm nonobstructing left renal inferior pole calculus. Vascular calcification versus a 3 mm nonobstructing right  renal upper pole calculus. There is no hydronephrosis on either side. There is bilateral renal cortical irregularity and scarring. Small bilateral renal cysts and subcentimeter hypodense lesions which are too small to characterize. There is symmetric enhancement and excretion of contrast by both kidneys. The visualized ureters appear unremarkable. The urinary bladder is collapsed. Stomach/Bowel: There is scattered distal colonic diverticula without active inflammatory changes. Status post right hemicolectomy with ileocolic anastomosis in the right upper quadrant. There is mild thickened appearance of distal small bowel loops. There is dilatation of the small bowel in the mid abdomen measuring up to 3 cm. A transition is noted in the anterior lower abdomen (axial 65/2 and coronal 22/3). There is thickened appearance of the distal stomach in the region of the gastric antrum and pylorus which may be reactive to the ascites or represent gastritis. Appendectomy. Vascular/Lymphatic: Mild aortoiliac atherosclerotic disease. The IVC is unremarkable. No portal venous gas. There is no adenopathy. Reproductive: The prostate and seminal vesicles are grossly unremarkable. No pelvic mass. Other: None Musculoskeletal: Degenerative changes of the spine. No acute osseous pathology. IMPRESSION: 1. Small-bowel obstruction with transition in the anterior lower abdomen. 2. Thickened appearance of the distal stomach in the region of the gastric antrum and pylorus which may be reactive to the ascites or represent gastritis. 3. Colonic diverticulosis. 4. Nonobstructing bilateral renal calculi. No hydronephrosis. 5. Aortic Atherosclerosis (ICD10-I70.0). Electronically Signed   By: Anner Crete M.D.   On: 03/18/2022 02:58  ? ?DG Abd Portable 1V-Small Bowel Obstruction Protocol-initial, 8 hr delay ? ?Result Date: 03/18/2022 ?CLINICAL DATA:  8 hour delayed image EXAM: PORTABLE ABDOMEN - 1 VIEW COMPARISON:  Previous studies including the CT  done earlier today FINDINGS: Tip of enteric tube is seen in the distal antrum of the stomach. There is oral contrast in the lumen of stomach. Faint contrast is seen in the dilated small bowel loops. Small bowel loops measure up to 3.8 cm in diameter. Oral contrast has reached the colon. No abnormal masses are seen. There is 4 mm calcific density overlying the left kidney. There are linear densities in the lower lung fields. There is blunting of left lateral CP angle which may suggest small effusion or pleural thickening. IMPRESSION: There is residual oral contrast in the lumen of stomach and dilated small bowel loops. Part of the contrast has reached the colon suggesting partial small bowel obstruction. Electronically Signed   By: Elmer Picker M.D.   On: 03/18/2022 18:32  ? ?DG Abd Portable 1V-Small Bowel Protocol-Position Verification ? ?Result Date: 03/18/2022 ?CLINICAL DATA:  NG tube placement EXAM: PORTABLE ABDOMEN - 1 VIEW COMPARISON:  03/18/2022 FINDINGS: NG tube tip remains position within the gastric antrum or proximal duodenum. Persistently dilated loops of small bowel within the included abdomen. IMPRESSION: NG tube tip remains in the gastric antrum or proximal duodenum. Electronically Signed   By: Davina Poke D.O.   On: 03/18/2022 09:33   ? ?Anti-infectives: ?Anti-infectives (From admission, onward)  ? ? None  ? ?  ? ? ? ?  Assessment/Plan ?SBO ?- films show contrast in colon  ?-DC NGT and start on CLD ?-mobilize ?-discussed with primary service ?  ?FEN - CLD, IVF per [facility] ?VTE - SCDs, heparin ?ID - None ?  ?  ?- Per [facility] - ?Possible gastritis - on PPI ?HTN ? ?I reviewed nursing notes, hospitalist notes, last 24 h vitals and pain scores, last 48 h intake and output, last 24 h labs and trends, and last 24 h imaging results. ? ? LOS: 1 day  ? ? ?Henreitta Cea , PA-C ?[facility] ?03/19/2022, 8:54 AM ?Please see Amion for pager number during day hours 7:00am-4:30pm or 7:00am -11:30am on  weekends ? ?

## 2022-03-19 NOTE — Progress Notes (Signed)
?PROGRESS NOTE ? ? ? ?Eduardo West  YIR:[id number] DOB: June 10, 1961 DOA: 03/18/2022 ?PCP: Rogers Blocker, MD  ? ?Brief Narrative:  ?61 year old male with history of prior bowel obstruction status post partial colectomy in 2011, hypertension, ventricular bigeminy presented with abdominal pain and nausea.  On presentation CT of abdomen and pelvis with contrast showed small bowel obstruction with a transition point near the anterior lower abdomen.  General surgery was consulted.  NG tube was placed. ? ?Assessment & Plan: ?  ?Small bowel obstruction in a patient with history of prior small bowel obstruction and prior abdominal surgeries ?-General surgery following.  Continue NG tube, IV fluids, n.p.o., pain management and antiemetics as needed ? ?Essential hypertension ?-Blood pressure intermittently on the high side.  Continue IV labetalol as needed ? ?History of ventricular paced minute ?-Currently rate controlled.  Flecainide on hold ? ?Hypokalemia ?-Labs pending for today.  Monitor ? ? ? ?DVT prophylaxis: Subcutaneous heparin ?Code Status: Full ?Family Communication: None at bedside ?Disposition Plan: ?Status is: Inpatient ?Remains inpatient appropriate because: Of severity of illness ? ? ? ?Consultants: General surgery ? ?Procedures: None ? ?Antimicrobials: None ? ? ?Subjective: ?Patient seen and examined at bedside.  Still complains of intermittent abdominal pain and nausea.  Had a bowel movement this morning.  No fever, worsening chest pain reported. ? ?Objective: ?Vitals:  ? 03/18/22 1630 03/18/22 1800 03/18/22 1904 03/18/22 2350  ?BP: (!) 186/118 (!) 151/92 (!) 174/104 (!) 148/82  ?Pulse: 64 69 69 72  ?Resp: '15 13 20 20  '$ ?Temp:  98.1 ?F (36.7 ?C) 98.2 ?F (36.8 ?C) 98 ?F (36.7 ?C)  ?TempSrc:  Oral Oral Oral  ?SpO2: 97% 99% 98% 96%  ? ? ?Intake/Output Summary (Last 24 hours) at 03/19/2022 0818 ?Last data filed at 03/19/2022 0800 ?Gross per 24 hour  ?Intake 3246.76 ml  ?Output 1150 ml  ?Net 2096.76 ml  ? ?There were no  vitals filed for this visit. ? ?Examination: ? ?General exam: Appears calm and comfortable.  On room air ?ENT: NG tube present ?Respiratory system: Bilateral decreased breath sounds at bases with some scattered crackles ?Cardiovascular system: S1 & S2 heard, Rate controlled ?Gastrointestinal system: Abdomen is distended, soft and diffusely tender mildly.  No rebound tenderness.  Bowel sounds sluggish ?Extremities: Trace lower extremity edema present; no clubbing ?Central nervous system: Alert and oriented. No focal neurological deficits. Moving extremities ?Skin: No rashes, lesions or ulcers ?Psychiatry: Affect is flat.  Currently not agitated. ? ? ?Data Reviewed: I have personally reviewed following labs and imaging studies ? ?CBC: ?Recent Labs  ?Lab 03/18/22 ?0111  ?WBC 8.9  ?NEUTROABS 7.1  ?HGB 13.0  ?HCT 39.4  ?MCV 97.5  ?PLT 351  ? ?Basic Metabolic Panel: ?Recent Labs  ?Lab 03/18/22 ?0111  ?NA 137  ?K 3.1*  ?CL 103  ?CO2 27  ?GLUCOSE 114*  ?BUN 13  ?CREATININE 1.16  ?CALCIUM 8.8*  ? ?GFR: ?CrCl cannot be calculated (Unknown ideal weight.). ?Liver Function Tests: ?Recent Labs  ?Lab 03/18/22 ?0111  ?AST 20  ?ALT 17  ?ALKPHOS 75  ?BILITOT 2.7*  ?PROT 6.7  ?ALBUMIN 3.8  ? ?Recent Labs  ?Lab 03/18/22 ?0111  ?LIPASE 43  ? ?No results for input(s): AMMONIA in the last 168 hours. ?Coagulation Profile: ?No results for input(s): INR, PROTIME in the last 168 hours. ?Cardiac Enzymes: ?No results for input(s): CKTOTAL, CKMB, CKMBINDEX, TROPONINI in the last 168 hours. ?BNP (last 3 results) ?No results for input(s): PROBNP in the last 8760 hours. ?HbA1C: ?No  results for input(s): HGBA1C in the last 72 hours. ?CBG: ?No results for input(s): GLUCAP in the last 168 hours. ?Lipid Profile: ?No results for input(s): CHOL, HDL, LDLCALC, TRIG, CHOLHDL, LDLDIRECT in the last 72 hours. ?Thyroid Function Tests: ?No results for input(s): TSH, T4TOTAL, FREET4, T3FREE, THYROIDAB in the last 72 hours. ?Anemia Panel: ?No results for  input(s): VITAMINB12, FOLATE, FERRITIN, TIBC, IRON, RETICCTPCT in the last 72 hours. ?Sepsis Labs: ?No results for input(s): PROCALCITON, LATICACIDVEN in the last 168 hours. ? ?No results found for this or any previous visit (from the past 240 hour(s)).  ? ? ? ? ? ?Radiology Studies: ?DG Abdomen 1 View ? ?Result Date: 03/18/2022 ?CLINICAL DATA:  Check NGT positioning. EXAM: ABDOMEN - 1 VIEW COMPARISON:  CT with IV contrast earlier today showing a small bowel obstruction. FINDINGS: NGT has been inserted and the tip is in the gastric antrum, well placed. No free air is seen. Upper to mid abdominal small bowel dilatation up to 3.2 cm continues to be noted and is unchanged. There is scattered colonic gas seen as far distally as the distal descending colon. Visceral shadows are unchanged. There is contrast in the renal collecting systems and ureters. There are cholecystectomy clips. IMPRESSION: NGT tip is in the gastric antrum with no change in mild small bowel dilatation. Electronically Signed   By: Telford Nab M.D.   On: 03/18/2022 04:17  ? ?CT Abdomen Pelvis W Contrast ? ?Result Date: 03/18/2022 ?CLINICAL DATA:  Abdominal pain. EXAM: CT ABDOMEN AND PELVIS WITH CONTRAST TECHNIQUE: Multidetector CT imaging of the abdomen and pelvis was performed using the standard protocol following bolus administration of intravenous contrast. RADIATION DOSE REDUCTION: This exam was performed according to the departmental dose-optimization program which includes automated exposure control, adjustment of the mA and/or kV according to patient size and/or use of iterative reconstruction technique. CONTRAST:  142m OMNIPAQUE IOHEXOL 300 MG/ML  SOLN COMPARISON:  CT abdomen pelvis dated 08/20/2016. FINDINGS: Lower chest: Trace right pleural effusion. Minimal bibasilar dependent atelectasis. The visualized lung bases are otherwise clear. No intra-abdominal free air.  Small perihepatic ascites. Hepatobiliary: Multiple liver cysts measure up  to 3.5 cm in the dome of the liver. Additional subcentimeter hypodense lesions are too small to characterize. No intrahepatic biliary dilatation. Cholecystectomy. Pancreas: Unremarkable. No pancreatic ductal dilatation or surrounding inflammatory changes. Spleen: Is 1 Adrenals/Urinary Tract: The adrenal glands unremarkable. There is a 4 mm nonobstructing left renal inferior pole calculus. Vascular calcification versus a 3 mm nonobstructing right renal upper pole calculus. There is no hydronephrosis on either side. There is bilateral renal cortical irregularity and scarring. Small bilateral renal cysts and subcentimeter hypodense lesions which are too small to characterize. There is symmetric enhancement and excretion of contrast by both kidneys. The visualized ureters appear unremarkable. The urinary bladder is collapsed. Stomach/Bowel: There is scattered distal colonic diverticula without active inflammatory changes. Status post right hemicolectomy with ileocolic anastomosis in the right upper quadrant. There is mild thickened appearance of distal small bowel loops. There is dilatation of the small bowel in the mid abdomen measuring up to 3 cm. A transition is noted in the anterior lower abdomen (axial 65/2 and coronal 22/3). There is thickened appearance of the distal stomach in the region of the gastric antrum and pylorus which may be reactive to the ascites or represent gastritis. Appendectomy. Vascular/Lymphatic: Mild aortoiliac atherosclerotic disease. The IVC is unremarkable. No portal venous gas. There is no adenopathy. Reproductive: The prostate and seminal vesicles are grossly unremarkable. No  pelvic mass. Other: None Musculoskeletal: Degenerative changes of the spine. No acute osseous pathology. IMPRESSION: 1. Small-bowel obstruction with transition in the anterior lower abdomen. 2. Thickened appearance of the distal stomach in the region of the gastric antrum and pylorus which may be reactive to the  ascites or represent gastritis. 3. Colonic diverticulosis. 4. Nonobstructing bilateral renal calculi. No hydronephrosis. 5. Aortic Atherosclerosis (ICD10-I70.0). Electronically Signed   By: Laren Everts.D.

## 2022-03-19 NOTE — Progress Notes (Signed)
Mr. Durnil has walked in the hallway several times this shift and tolerating his clear liquid diet well with no complaints. Abdomen soft and nontender. ?Interpreter used when doing assessments on patient. He expresses need for more education regarding SBO and things specific to him that he can change to prevent this happening again.  ?

## 2022-03-20 DIAGNOSIS — E876 Hypokalemia: Secondary | ICD-10-CM

## 2022-03-20 DIAGNOSIS — I498 Other specified cardiac arrhythmias: Secondary | ICD-10-CM | POA: Diagnosis not present

## 2022-03-20 DIAGNOSIS — K56609 Unspecified intestinal obstruction, unspecified as to partial versus complete obstruction: Secondary | ICD-10-CM | POA: Diagnosis not present

## 2022-03-20 DIAGNOSIS — I10 Essential (primary) hypertension: Secondary | ICD-10-CM | POA: Diagnosis not present

## 2022-03-20 MED ORDER — ALUM & MAG HYDROXIDE-SIMETH 200-200-20 MG/5ML PO SUSP: 30.0000 mL | Freq: Four times a day (QID) | ORAL | 0 refills | Status: AC | PRN

## 2022-03-20 MED ORDER — PANTOPRAZOLE SODIUM 40 MG PO TBEC: 40.0000 mg | DELAYED_RELEASE_TABLET | Freq: Every day | ORAL | 0 refills | Status: AC

## 2022-03-20 MED ORDER — ONDANSETRON HCL 4 MG PO TABS: 4.0000 mg | ORAL_TABLET | Freq: Three times a day (TID) | ORAL | 0 refills | Status: AC | PRN

## 2022-03-20 NOTE — Progress Notes (Signed)
Discharge instructions discussed at length with patient and his daughter. All questions answered. IVs removed. Pt dressed self and walked out with his daughter at 75. Pt declined wheelchair.  ?

## 2022-03-20 NOTE — Progress Notes (Signed)
? ? ?   ?Subjective: ?Looks and feels great today.  Has had about 6 loose BMs yesterday and today total.  Tolerating CLD with no issues. ? ?ROS: See above, otherwise other systems negative ? ?Objective: ?Vital signs in last 24 hours: ?Temp:  [97.9 ?F (36.6 ?C)-98.2 ?F (36.8 ?C)] 97.9 ?F (36.6 ?C) (04/20 2020) ?Pulse Rate:  [66-79] 66 (04/21 0519) ?Resp:  [16-18] 18 (04/21 0519) ?BP: (136-159)/(68-106) 159/106 (04/21 0519) ?SpO2:  [98 %] 98 % (04/21 0519) ?Weight:  [74.5 kg] 74.5 kg (04/20 2209) ?Last BM Date : 03/20/22 (per patient) ? ?Intake/Output from previous day: ?04/20 0701 - 04/21 0700 ?In: 0258 [P.O.:360; I.V.:3105] ?Out: 200 [Urine:200] ?Intake/Output this shift: ?Total I/O ?In: 621 [I.V.:621] ?Out: -  ? ?PE: ?Abd: soft, NT, ND ? ?Lab Results:  ?Recent Labs  ?  03/18/22 ?0111  ?WBC 8.9  ?HGB 13.0  ?HCT 39.4  ?PLT 351  ? ?BMET ?Recent Labs  ?  03/18/22 ?0111 03/19/22 ?5277  ?NA 137 140  ?K 3.1* 3.9  ?CL 103 110  ?CO2 27 23  ?GLUCOSE 114* 90  ?BUN 13 11  ?CREATININE 1.16 1.13  ?CALCIUM 8.8* 8.2*  ? ?PT/INR ?No results for input(s): LABPROT, INR in the last 72 hours. ?CMP  ?   ?Component Value Date/Time  ? NA 140 03/19/2022 0905  ? NA 140 09/29/2021 1120  ? K 3.9 03/19/2022 0905  ? CL 110 03/19/2022 0905  ? CO2 23 03/19/2022 0905  ? GLUCOSE 90 03/19/2022 0905  ? BUN 11 03/19/2022 0905  ? BUN 15 09/29/2021 1120  ? CREATININE 1.13 03/19/2022 0905  ? CREATININE 1.20 04/23/2016 1029  ? CALCIUM 8.2 (L) 03/19/2022 0905  ? PROT 6.7 03/18/2022 0111  ? PROT 7.5 09/29/2021 1120  ? ALBUMIN 3.8 03/18/2022 0111  ? ALBUMIN 5.0 (H) 09/29/2021 1120  ? AST 20 03/18/2022 0111  ? ALT 17 03/18/2022 0111  ? ALKPHOS 75 03/18/2022 0111  ? BILITOT 2.7 (H) 03/18/2022 0111  ? BILITOT 1.7 (H) 09/29/2021 1120  ? GFRNONAA >60 03/19/2022 0905  ? GFRAA 81 11/18/2018 1151  ? ?Lipase  ?   ?Component Value Date/Time  ? LIPASE 43 03/18/2022 0111  ? ? ? ? ? ?Studies/Results: ?DG Abd Portable 1V-Small Bowel Obstruction Protocol-initial, 8 hr  delay ? ?Result Date: 03/18/2022 ?CLINICAL DATA:  8 hour delayed image EXAM: PORTABLE ABDOMEN - 1 VIEW COMPARISON:  Previous studies including the CT done earlier today FINDINGS: Tip of enteric tube is seen in the distal antrum of the stomach. There is oral contrast in the lumen of stomach. Faint contrast is seen in the dilated small bowel loops. Small bowel loops measure up to 3.8 cm in diameter. Oral contrast has reached the colon. No abnormal masses are seen. There is 4 mm calcific density overlying the left kidney. There are linear densities in the lower lung fields. There is blunting of left lateral CP angle which may suggest small effusion or pleural thickening. IMPRESSION: There is residual oral contrast in the lumen of stomach and dilated small bowel loops. Part of the contrast has reached the colon suggesting partial small bowel obstruction. Electronically Signed   By: Elmer Picker M.D.   On: 03/18/2022 18:32   ? ?Anti-infectives: ?Anti-infectives (From admission, onward)  ? ? None  ? ?  ? ? ? ?Assessment/Plan ?SBO ?-resolving ?-adv to soft diet, if tolerates can DC home from surgical standpoint ?-discussed with primary service. ?  ?FEN - soft, IVF per [facility] ?VTE -  SCDs, heparin ?ID - None ?  ?  ?- Per [facility] - ?Possible gastritis - on PPI ?HTN ? ?I reviewed nursing notes, hospitalist notes, last 24 h vitals and pain scores, last 48 h intake and output, last 24 h labs and trends, and last 24 h imaging results. ? ? LOS: 2 days  ? ? ?Henreitta Cea , PA-C ?[facility] ?03/20/2022, 9:17 AM ?Please see Amion for pager number during day hours 7:00am-4:30pm or 7:00am -11:30am on weekends ? ?

## 2022-03-20 NOTE — Discharge Summary (Signed)
Physician Discharge Summary  ?Eduardo West KWI:[id number] DOB: August 08, 1961 DOA: 03/18/2022 ? ?PCP: Rogers Blocker, MD ? ?Admit date: 03/18/2022 ?Discharge date: 03/20/2022 ? ?Admitted From: Home ?Disposition: Home ? ?Recommendations for Outpatient Follow-up:  ?Follow up with PCP in 1 week  ?Follow-up with general surgery if needed ?Follow up in ED if symptoms worsen or new appear ? ? ?Home Health: No ?Equipment/Devices: None ? ?Discharge Condition: Stable ?CODE STATUS: Full ?Diet recommendation: Heart healthy/soft diet ? ?Brief/Interim Summary: ?61 year old male with history of prior bowel obstruction status post partial colectomy in 2011, hypertension, ventricular bigeminy presented with abdominal pain and nausea.  On presentation CT of abdomen and pelvis with contrast showed small bowel obstruction with a transition point near the anterior lower abdomen.  General surgery was consulted.  NG tube was placed.  During the hospitalization, his condition has gradually improved.  NG tube has been removed and diet has been gradually advanced by general surgery.  Patient is having bowel movements.  Diet is being advanced today and general surgery has cleared the patient for discharge if he tolerates advance diet.  He will be discharged home today if he tolerates advanced diet. ? ?Discharge Diagnoses:  ? ?Small bowel obstruction in a patient with history of prior small bowel obstruction and prior abdominal surgeries ?-Treated conservatively with n.p.o., NGT, IV fluids, analgesics and antiemetics.   ?- During the hospitalization, his condition has gradually improved.  NG tube has been removed and diet has been gradually advanced by general surgery.  Patient is having bowel movements. Diet is being advanced today and general surgery has cleared the patient for discharge if he tolerates advance diet.  He will be discharged home today if he tolerates advanced diet. ?  ?Essential hypertension ?-Blood pressure intermittently on the high  side.  Resume home regimen on discharge. ?History of ventricular bigeminy ?-Currently rate controlled.  Resume flecainide ? ?Hypokalemia ?-Resolved.  No labs for today. ? ?Discharge Instructions ? ? ?Allergies as of 03/20/2022   ?No Known Allergies ?  ? ?  ?Medication List  ?  ? ?TAKE these medications   ? ?acetaminophen 500 MG tablet ?Commonly known as: TYLENOL ?Take 1,000 mg by mouth every 6 (six) hours as needed for moderate pain. ?  ?Advair HFA 115-21 MCG/ACT inhaler ?Generic drug: fluticasone-salmeterol ?Inhale 2 puffs into the lungs 2 (two) times daily. ?What changed: when to take this ?  ?albuterol 108 (90 Base) MCG/ACT inhaler ?Commonly known as: VENTOLIN HFA ?Inhale 2 puffs into the lungs every 6 (six) hours as needed for wheezing or shortness of breath. ?  ?alum & mag hydroxide-simeth 200-200-20 MG/5ML suspension ?Commonly known as: MAALOX/MYLANTA ?Take 30 mLs by mouth every 6 (six) hours as needed for indigestion or heartburn (or bloating). ?  ?aspirin EC 81 MG tablet ?Take 81 mg by mouth daily. ?  ?atorvastatin 40 MG tablet ?Commonly known as: LIPITOR ?Take 1 tablet (40 mg total) by mouth daily. ?  ?CORICIDIN HBP COUGH/COLD PO ?Take 1 tablet by mouth daily as needed (cough). ?  ?flecainide 100 MG tablet ?Commonly known as: TAMBOCOR ?Take 1 tablet (100 mg total) by mouth 2 (two) times daily. ?  ?ibuprofen 200 MG tablet ?Commonly known as: ADVIL ?Take 400 mg by mouth every 6 (six) hours as needed for headache or moderate pain. ?  ?lisinopril 20 MG tablet ?Commonly known as: ZESTRIL ?TAKE 1 TABLET BY MOUTH EVERY DAY ?  ?ondansetron 4 MG tablet ?Commonly known as: Zofran ?Take 1 tablet (4 mg total) by  mouth every 8 (eight) hours as needed for nausea or vomiting. ?  ?pantoprazole 40 MG tablet ?Commonly known as: Protonix ?Take 1 tablet (40 mg total) by mouth daily for 15 days. ?  ?SYSTANE COMPLETE OP ?Place 2 drops into both eyes daily as needed (dry eyes). ?  ?tamsulosin 0.4 MG Caps capsule ?Commonly known  as: FLOMAX ?Take 0.4 mg by mouth daily. ?  ?Vitamin D 50 MCG (2000 UT) tablet ?Take 2,000 Units by mouth daily. ?  ? ?  ? ? ? ?No Known Allergies ? ?Consultations: ?General surgery ? ? ?Procedures/Studies: ?DG Abdomen 1 View ? ?Result Date: 03/18/2022 ?CLINICAL DATA:  Check NGT positioning. EXAM: ABDOMEN - 1 VIEW COMPARISON:  CT with IV contrast earlier today showing a small bowel obstruction. FINDINGS: NGT has been inserted and the tip is in the gastric antrum, well placed. No free air is seen. Upper to mid abdominal small bowel dilatation up to 3.2 cm continues to be noted and is unchanged. There is scattered colonic gas seen as far distally as the distal descending colon. Visceral shadows are unchanged. There is contrast in the renal collecting systems and ureters. There are cholecystectomy clips. IMPRESSION: NGT tip is in the gastric antrum with no change in mild small bowel dilatation. Electronically Signed   By: Telford Nab M.D.   On: 03/18/2022 04:17  ? ?CT Abdomen Pelvis W Contrast ? ?Result Date: 03/18/2022 ?CLINICAL DATA:  Abdominal pain. EXAM: CT ABDOMEN AND PELVIS WITH CONTRAST TECHNIQUE: Multidetector CT imaging of the abdomen and pelvis was performed using the standard protocol following bolus administration of intravenous contrast. RADIATION DOSE REDUCTION: This exam was performed according to the departmental dose-optimization program which includes automated exposure control, adjustment of the mA and/or kV according to patient size and/or use of iterative reconstruction technique. CONTRAST:  161m OMNIPAQUE IOHEXOL 300 MG/ML  SOLN COMPARISON:  CT abdomen pelvis dated 08/20/2016. FINDINGS: Lower chest: Trace right pleural effusion. Minimal bibasilar dependent atelectasis. The visualized lung bases are otherwise clear. No intra-abdominal free air.  Small perihepatic ascites. Hepatobiliary: Multiple liver cysts measure up to 3.5 cm in the dome of the liver. Additional subcentimeter hypodense lesions  are too small to characterize. No intrahepatic biliary dilatation. Cholecystectomy. Pancreas: Unremarkable. No pancreatic ductal dilatation or surrounding inflammatory changes. Spleen: Is 1 Adrenals/Urinary Tract: The adrenal glands unremarkable. There is a 4 mm nonobstructing left renal inferior pole calculus. Vascular calcification versus a 3 mm nonobstructing right renal upper pole calculus. There is no hydronephrosis on either side. There is bilateral renal cortical irregularity and scarring. Small bilateral renal cysts and subcentimeter hypodense lesions which are too small to characterize. There is symmetric enhancement and excretion of contrast by both kidneys. The visualized ureters appear unremarkable. The urinary bladder is collapsed. Stomach/Bowel: There is scattered distal colonic diverticula without active inflammatory changes. Status post right hemicolectomy with ileocolic anastomosis in the right upper quadrant. There is mild thickened appearance of distal small bowel loops. There is dilatation of the small bowel in the mid abdomen measuring up to 3 cm. A transition is noted in the anterior lower abdomen (axial 65/2 and coronal 22/3). There is thickened appearance of the distal stomach in the region of the gastric antrum and pylorus which may be reactive to the ascites or represent gastritis. Appendectomy. Vascular/Lymphatic: Mild aortoiliac atherosclerotic disease. The IVC is unremarkable. No portal venous gas. There is no adenopathy. Reproductive: The prostate and seminal vesicles are grossly unremarkable. No pelvic mass. Other: None Musculoskeletal: Degenerative changes of  the spine. No acute osseous pathology. IMPRESSION: 1. Small-bowel obstruction with transition in the anterior lower abdomen. 2. Thickened appearance of the distal stomach in the region of the gastric antrum and pylorus which may be reactive to the ascites or represent gastritis. 3. Colonic diverticulosis. 4. Nonobstructing  bilateral renal calculi. No hydronephrosis. 5. Aortic Atherosclerosis (ICD10-I70.0). Electronically Signed   By: Anner Crete M.D.   On: 03/18/2022 02:58  ? ?DG Abd Portable 1V-Small Bowel Obstruction Proto

## 2022-04-02 ENCOUNTER — Encounter: Payer: Self-pay | Admitting: Pulmonary Disease

## 2022-04-02 ENCOUNTER — Ambulatory Visit: Payer: Medicaid Other | Admitting: Pulmonary Disease

## 2022-04-02 DIAGNOSIS — J4530 Mild persistent asthma, uncomplicated: Secondary | ICD-10-CM | POA: Diagnosis not present

## 2022-04-02 MED ORDER — FLUTICASONE-SALMETEROL 115-21 MCG/ACT IN AERO: 2.0000 | INHALATION_SPRAY | Freq: Two times a day (BID) | RESPIRATORY_TRACT | 11 refills | Status: DC

## 2022-04-02 NOTE — Patient Instructions (Signed)
?  Mild Persistent Asthma - stable ?--CONTINUE Advair HFA 115-4.5 TWO puffs TWO times daily. This is your EVERYDAY medicine ?--CONTINUE Albuterol TWO puffs AS NEEDED. This is your EMERGENCY inhaler ?--Keep up to date on vaccinations ? ?Asthma Action Plan ?Use albuterol for worsening shortness of breath, wheezing and cough. If you symptoms do not improve in 24-48 hours, please our office for evaluation and/or prednisone taper. ? ?Mild restrictive defect, lung scarring ?Chest imaging from 2011-2018 with chronic left pleural parenchymal scarring likely related to prior pleural effusion.  ?--No action needed ? ?Follow-up with me in 1 year ?

## 2022-04-02 NOTE — Progress Notes (Signed)
? ? ?Subjective:  ? ?PATIENT ID: Eduardo West GENDER: male DOB: [birth year], MRN: [id number] ? ? ?HPI ? ?Chief Complaint  ?Patient presents with  ? Follow-up  ?  1 yr follow-up -- During the day it is fine. At night when laying down, SOB. Needs to be propped up but not every night ?Needs refill advair HFA 115  ? ? ?Reason for Visit: Follow-up ? ?[name] is a 61 year old former smoker with asthma, history pulmonary embolism who presents for follow-up. Vietnamese interpretor present. ? ?Since our last visit, he reports his asthma is well controlled. He is compliant with his Advair and rarely uses his albuterol. Denies chronic cough, shortness of breath or wheezing. He seems to have worsening dyspnea when laying down at night but this improves with sleeping on two pillows. He thinks this is related to his stomach issues. He was recently hospitalized for SBO last month which he intermittently has with his hx of in the past.  ? ?Social History: ?Previously smoked 20 years for 1/2 ppd. Quit 11 years in 2011. ?Previously worked as a Psychologist, sport and exercise, Emergency planning/management officer, Research scientist (medical) ? ?Past Medical History:  ?Diagnosis Date  ? Abdominal pain   ? Buzzing in ear   ? Chronic diarrhea 03/04/2012  ? Colon polyp   ? Diarrhea   ? Diverticulitis of cecum 12/2009  ? Resected 2011:  COLON, SEGMENTAL RESECTION, RIGHT AND TERMINAL ILEUM : - SEGMENT OF COLON  ? Hypertension   ? Kidney stones   ? Pleural effusion, left   ? 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 Right leg numbness   ?  ? ?Family History  ?Problem Relation Age of Onset  ? Cancer Mother   ?  ? ?Social History  ? ?Occupational History  ? Occupation: unemployed  ?Tobacco Use  ? Smoking status: Former  ?  Packs/day: 0.50  ?  Years: 20.00  ?  Pack years: 10.00  ?  Types: Cigarettes  ?  Quit date: 11/30/2009  ?  Years since quitting: 12.3  ? Smokeless tobacco: Never  ?Substance and Sexual Activity  ? Alcohol use: No  ? Drug use: No  ? Sexual activity: Not on file  ? ? ?No Known Allergies  ? ?Outpatient  Medications Prior to Visit  ?Medication Sig Dispense Refill  ? acetaminophen (TYLENOL) 500 MG tablet Take 1,000 mg by mouth every 6 (six) hours as needed for moderate pain.    ? albuterol (VENTOLIN HFA) 108 (90 Base) MCG/ACT inhaler Inhale 2 puffs into the lungs every 6 (six) hours as needed for wheezing or shortness of breath. 1 each 11  ? alum & mag hydroxide-simeth (MAALOX/MYLANTA) 200-200-20 MG/5ML suspension Take 30 mLs by mouth every 6 (six) hours as needed for indigestion or heartburn (or bloating). 355 mL 0  ? aspirin EC 81 MG tablet Take 81 mg by mouth daily.    ? atorvastatin (LIPITOR) 40 MG tablet Take 1 tablet (40 mg total) by mouth daily. 90 tablet 3  ? Chlorpheniramine-DM (CORICIDIN HBP COUGH/COLD PO) Take 1 tablet by mouth daily as needed (cough).    ? Cholecalciferol (VITAMIN D) 50 MCG (2000 UT) tablet Take 2,000 Units by mouth daily.    ? flecainide (TAMBOCOR) 100 MG tablet Take 1 tablet (100 mg total) by mouth 2 (two) times daily. 180 tablet 3  ? fluticasone-salmeterol (ADVAIR HFA) 115-21 MCG/ACT inhaler Inhale 2 puffs into the lungs 2 (two) times daily. (Patient taking differently: Inhale 2 puffs into the lungs at bedtime.) 1  each 11  ? ibuprofen (ADVIL,MOTRIN) 200 MG tablet Take 400 mg by mouth every 6 (six) hours as needed for headache or moderate pain.    ? lisinopril (PRINIVIL,ZESTRIL) 20 MG tablet TAKE 1 TABLET BY MOUTH EVERY DAY (Patient taking differently: Take 20 mg by mouth daily.) 90 tablet 3  ? ondansetron (ZOFRAN) 4 MG tablet Take 1 tablet (4 mg total) by mouth every 8 (eight) hours as needed for nausea or vomiting. 20 tablet 0  ? pantoprazole (PROTONIX) 40 MG tablet Take 1 tablet (40 mg total) by mouth daily for 15 days. 15 tablet 0  ? Propylene Glycol (SYSTANE COMPLETE OP) Place 2 drops into both eyes daily as needed (dry eyes).    ? tamsulosin (FLOMAX) 0.4 MG CAPS capsule Take 0.4 mg by mouth daily.    ? ?No facility-administered medications prior to visit.  ? ? ?Review of Systems   ?Constitutional:  Negative for chills, diaphoresis, fever, malaise/fatigue and weight loss.  ?HENT:  Negative for congestion.   ?Respiratory:  Negative for cough, hemoptysis, sputum production, shortness of breath and wheezing.   ?Cardiovascular:  Negative for chest pain, palpitations and leg swelling.  ?Objective:  ? ?There were no vitals filed for this visit. ? ?  ? ?Physical Exam: ?General: Well-appearing, no acute distress ?HENT: [place], AT ?Eyes: EOMI, no scleral icterus ?Respiratory: Clear to auscultation bilaterally.  No crackles, wheezing or rales ?Cardiovascular: RRR, -M/R/G, no JVD ?Extremities:-Edema,-tenderness ?Neuro: AAO x4, CNII-XII grossly intact ?Psych: Normal mood, normal affect ? ?Data Reviewed: ? ?Imaging: ?CXR 11/12/11 - Small loculated left pleural effusion with basilar scarring ?CXR 02/04/12 - Tiny left pleural effusion. Unchanged left basilar scarring ?CXR 04/04/12 - Left blunting of costophrenic angle ?CT A/P 10/10/12 - Bibasilar pleural effusions. Left basilar pleural thickening ?CT A/P 01/24/14 - Similar left basilar thickening. SBO. S/p right colectomy ?CT HR Chest 01/13/17 - Pleural parenchymal scarring ?CT Chest 02/28/21 -  No evidence of pleural effusion. Residual scarring in left lower lobe ?CT A/P 03/18/22 - SBO, colonic diverticulosis ? ?PFT: ?07/26/17  ?FVC 3.62 (97%) FEV1 2.81 (93%) Ratio 70  TLC 74% DLCO 87% ?Interpretation: Mixed mild obstructive and restrictive defect. No significant bronchodilator response ? ?02/17/21 ?FVC 3.41 (93%) FEV1 2.67 (91%) Ratio 76  TLC 74% DLCO 82% ?Interpretation: Mild restrictive defect. No significant bronchodilator response ? ?Labs: ?CBC ?   ?Component Value Date/Time  ? WBC 8.9 03/18/2022 0111  ? RBC 4.04 (L) 03/18/2022 0111  ? HGB 13.0 03/18/2022 0111  ? HCT 39.4 03/18/2022 0111  ? PLT 351 03/18/2022 0111  ? MCV 97.5 03/18/2022 0111  ? MCH 32.2 03/18/2022 0111  ? MCHC 33.0 03/18/2022 0111  ? RDW 11.9 03/18/2022 0111  ? LYMPHSABS 1.0 03/18/2022 0111  ?  MONOABS 0.5 03/18/2022 0111  ? EOSABS 0.3 03/18/2022 0111  ? BASOSABS 0.0 03/18/2022 0111  ?Absolute eos 12/05/14 - 300 ?   ?Assessment & Plan:  ? ?Discussion: ?61 year old male with asthma, prior pleural effusion in 2011, hx SBO, HTN who presents for follow-up. Well-controlled asthma on current regimen. Reviewed history. Discussed clinical course and management of asthma including bronchodilator regimen and action plan for exacerbation. ? ?Mild Persistent Asthma - stable ?--CONTINUE Advair HFA 115-4.5 TWO puffs TWO times daily. This is your EVERYDAY medicine ?--CONTINUE Albuterol TWO puffs AS NEEDED. This is your EMERGENCY inhaler ?--Keep up to date on vaccinations ? ?Asthma Action Plan ?Use albuterol for worsening shortness of breath, wheezing and cough. If you symptoms do not improve in  24-48 hours, please our office for evaluation and/or prednisone taper. ? ?Mild restrictive defect, lung scarring ?Chest imaging from 2011-2018 with chronic left pleural parenchymal scarring likely related to prior pleural effusion.  ?--No action needed ? ?Health Maintenance ?Immunization History  ?Administered Date(s) Administered  ? Influenza Inj Mdck Quad Pf 11/15/2018  ? Influenza, High Dose Seasonal PF 10/01/2020  ? Influenza,inj,Quad PF,6+ Mos 09/30/2016  ? Influenza-Unspecified 08/14/2017  ? Moderna SARS-COV2 Booster Vaccination 10/01/2020  ? Moderna Sars-Covid-2 Vaccination 02/20/2020, 03/19/2020  ? ?CT Lung Screen - not indicated ? ?No orders of the defined types were placed in this encounter. ? ?Meds ordered this encounter  ?Medications  ? fluticasone-salmeterol (ADVAIR HFA) 115-21 MCG/ACT inhaler  ?  Sig: Inhale 2 puffs into the lungs 2 (two) times daily.  ?  Dispense:  1 each  ?  Refill:  11  ? ? ?Return in about 1 year (around 04/03/2023). ? ?I have spent a total time of 31-minutes on the day of the appointment including chart review, data review, collecting history, coordinating care and discussing medical diagnosis and  plan with the patient/family. Past medical history, allergies, medications were reviewed. Pertinent imaging, labs and tests included in this note have been reviewed and interpreted independently by me. ? ?Rusty Villella Jan

## 2022-08-23 ENCOUNTER — Other Ambulatory Visit: Payer: Self-pay | Admitting: Internal Medicine

## 2022-09-17 ENCOUNTER — Ambulatory Visit (HOSPITAL_COMMUNITY)
Admission: RE | Admit: 2022-09-17 | Discharge: 2022-09-17 | Disposition: A | Discharge status: Home / Self Care | Payer: Medicaid Other | Source: Ambulatory Visit | Attending: Internal Medicine | Admitting: Internal Medicine

## 2022-09-17 DIAGNOSIS — Z79899 Other long term (current) drug therapy: Secondary | ICD-10-CM | POA: Insufficient documentation

## 2022-09-17 DIAGNOSIS — Z5181 Encounter for therapeutic drug level monitoring: Secondary | ICD-10-CM | POA: Diagnosis not present

## 2022-09-17 LAB — EXERCISE TOLERANCE TEST
Estimated workload: 6.6
Exercise duration (min): 4 min
Exercise duration (sec): 45 s
MPHR: 161 {beats}/min
Peak HR: 137 {beats}/min
Percent HR: 85 %
Rest HR: 80 {beats}/min

## 2022-09-18 ENCOUNTER — Encounter: Payer: Self-pay | Admitting: Internal Medicine

## 2022-09-28 ENCOUNTER — Telehealth: Payer: Self-pay | Admitting: Internal Medicine

## 2022-09-28 MED ORDER — FLECAINIDE ACETATE 100 MG PO TABS: 100.0000 mg | ORAL_TABLET | Freq: Two times a day (BID) | ORAL | 3 refills | Status: DC

## 2022-09-28 NOTE — Telephone Encounter (Signed)
Verified with daughter the refill for flecainide (90-day supply). Informed daughter to have patient keep his 12/25/22 appointment with Dr. Debara Pickett. She stated he will need translation service for the [place] language. Assured her that will be done.

## 2022-09-28 NOTE — Telephone Encounter (Signed)
*  STAT* If patient is at the pharmacy, call can be transferred to refill team.   1. Which medications need to be refilled? (please list name of each medication and dose if known)  flecainide (TAMBOCOR) 100 MG tablet  2. Which pharmacy/location (including street and city if local pharmacy) is medication to be sent to? [facility], [place] - [address] AT [place]  3. Do they need a 30 day or 90 day supply? 90   Has appt on 1/26

## 2022-10-27 ENCOUNTER — Encounter: Payer: Self-pay | Admitting: Internal Medicine

## 2022-10-27 ENCOUNTER — Ambulatory Visit: Payer: Medicaid Other | Attending: Internal Medicine | Admitting: Internal Medicine

## 2022-10-27 ENCOUNTER — Other Ambulatory Visit: Payer: Self-pay

## 2022-10-27 VITALS — BP 138/86 | HR 79 | Ht 63.0 in | Wt 157.6 lb

## 2022-10-27 DIAGNOSIS — Z5181 Encounter for therapeutic drug level monitoring: Secondary | ICD-10-CM | POA: Diagnosis not present

## 2022-10-27 DIAGNOSIS — Z79899 Other long term (current) drug therapy: Secondary | ICD-10-CM

## 2022-10-27 DIAGNOSIS — E785 Hyperlipidemia, unspecified: Secondary | ICD-10-CM

## 2022-10-27 DIAGNOSIS — I10 Essential (primary) hypertension: Secondary | ICD-10-CM

## 2022-10-27 DIAGNOSIS — I498 Other specified cardiac arrhythmias: Secondary | ICD-10-CM | POA: Diagnosis not present

## 2022-10-27 MED ORDER — FLECAINIDE ACETATE 100 MG PO TABS: 100.0000 mg | ORAL_TABLET | Freq: Two times a day (BID) | ORAL | 3 refills | Status: DC

## 2022-10-27 NOTE — Patient Instructions (Signed)
Medication Instructions:  NO CHANGES  *If you need a refill on your cardiac medications before your next appointment, please call your pharmacy*    Follow-Up: At [facility], you and your health needs are our priority.  As part of our continuing mission to provide you with exceptional heart care, we have created designated Provider Care Teams.  These Care Teams include your primary Cardiologist (physician) and Advanced Practice Providers (APPs -  Physician Assistants and Nurse Practitioners) who all work together to provide you with the care you need, when you need it.  We recommend signing up for the patient portal called "MyChart".  Sign up information is provided on this After Visit Summary.  MyChart is used to connect with patients for Virtual Visits (Telemedicine).  Patients are able to view lab/test results, encounter notes, upcoming appointments, etc.  Non-urgent messages can be sent to your provider as well.   To learn more about what you can do with MyChart, go to [email or web address].    Your next appointment:   12 month(s)  The format for your next appointment:   In Person  Provider:   Lyman Bishop MD

## 2022-10-27 NOTE — Progress Notes (Signed)
OFFICE NOTE  Chief Complaint:  Follow-up  Primary Care Physician: Rogers Blocker, MD  HPI:  Eduardo West is a pleasant 61 year old [name] he is male who is seen today with a Optometrist. He is referred by Dr. Jimmye Norman for evaluation of an abnormal heart rhythm. EKG today shows ventricular bigeminy with ST and T wave abnormalities laterally concerning for ischemia. Eduardo West reports some increasing chest discomfort with exertion as well as shortness of breath. He says that he typically walks 30-45 minutes almost every day but recently has had worsening shortness of breath and his fatigue after only 5-10 minutes. He says that his chest discomfort is worse when exerting himself and released somewhat by rest. There is no significant family history of heart disease per his report although hypertension is his family. He was recently hospitalized for small bowel obstruction and has improved. He has had partial colon resection as well as appendectomy in the past. In January 2015 while hospitalized he was seen for chest pain by Dr. Pernell Dupre. Echocardiogram was recommended which showed an EF of 60-65% with mild diastolic dysfunction and moderate concentric LVH. There were no segmental wall motion abnormalities. He reports his symptoms have progressively gotten worse since his recent hospitalization.  Eduardo West returns for follow-up. At his last office visit I started him on low-dose beta blocker and he remains in ventricular bigeminy today. The electrical rate is 68 however his pulse rate is half of that at 34. He cannot express whether he has more fatigue or chest discomfort on the medicine than he had before. He certainly does not feel in any improvement on the beta blocker. Pulse rate may be slower.  I had the pleasure seeing Eduardo West back in the office today. In the interim he seen Dr. Cristopher Peru twice, who started him on flecainide and increased dose to 100 mg twice a day. This seems to have been  effective in suppressing his PVCs. He does feel somewhat better and has more energy and is less short of breath.  I saw Eduardo West back in the office today with an interpreter. Overall he is doing fairly well. He is currently on flecainide 100 mg twice a day. He reports nearly complete resolution of his palpitations. His EKG shows no evidence of bigeminy. He says he gets occasional sharp chest pain but no significant symptoms that sound anginal. He also has some mild shortness of breath. He underwent a stress test last year which was negative for ischemia. Blood pressure appears well controlled on Dyazide.  10/28/2016  Eduardo West returns today for follow-up. He is accompanied by a [place] interpreter. Overall he feels well. He denies any significant palpitations. He has been on flecainide 100 mg twice a day. QTc interval is 467 ms today with EKG demonstrating sinus rhythm and first-degree AV block at 212 ms. He recently was in the hospital for abdominal pain and was given some hydrocodone with improvement. The etiology was unclear. He's not had any significant palpitations. He denies any chest pain. He had a stress test in April of this year which was nonischemic. He will need another stress test in 2 years for monitoring of flecainide therapy.  08/03/2017  Eduardo West returns today for follow-up. Recently he was seen by Almyra Deforest, PA-C for episodes of chest pain at rest and with exertion. Amlodipine was added for antianginal benefit however he says that he feels dizzy on this medication. Blood pressure is low normal today at 112/88. I  suspect he may be getting hypotensive with it. He does not feel that it's improved his chest discomfort. It is also notable that he is not taking aspirin daily. He was supposed to be on this medicine. I went over this with him in the interpreter today and provided samples of 81 mg aspirin. He should take this daily. This is a treatment for coronary  disease.  11/11/2017  Eduardo West returns today for follow-up.  He is accompanied by an interpreter.  When I last saw him we readjusted his medications, namely we discontinued his amlodipine.  He was having problems with swelling and he had no improvement in his chest discomfort.  At times he was also having some hypotension.  Blood pressure was retested today and was initially elevated 152/90 however recheck came back at 124/80 in the left arm and 130/88 in the right arm.  He remains on flecainide and has had no recurrent atrial fibrillation.  EKG today shows sinus rhythm with a QTC of 425 ms.  11/11/2018  Eduardo West is seen today in follow-up with the [place] translator.  Symptomatically he is doing well.  Blood pressure seems to be still elevated.  He denies any palpitations and is tolerating his flecainide.  His last stress test was 2 years ago.  EKG shows sinus rhythm with first-degree AV block and QTC of 360 ms.  He denies chest pain or worsening shortness of breath.  12/21/2018  Eduardo West is seen today in follow-up.  He is accompanied by [place].  Symptom wise he denies any palpitations.  He feels like he is doing well on the flecainide.  He does get some chest discomfort but only when he is under stress or if he is arguing with his teenage daughters.  Apparently he has 3 daughters at home.  He did undergo a stress test which was routine for monitoring on flecainide.  This did not demonstrate any ischemic EKG changes nor any QRS widening associated with the flecainide.  08/02/2020  Eduardo West returns today for follow-up.  Overall he is doing well.  He is again accompanied by by a [place]. translator.  He denies any chest pain or shortness of breath.  He has near complete resolution of his PVCs.  Overall he is tolerating the flecainide well.  He had stress testing last year which was negative for ischemia.  09/29/2021  Eduardo West returns today for follow-up.  He was seen  with a professional [place].  He denies any chest pain or worsening shortness of breath.  He reports his palpitations are well controlled.  He says he is close to running out of his flecainide and needs refills of that and his Lipitor.  His last cholesterol check however was in 2019.  He is compliant with the medicine but has not been rechecked to my knowledge.  He does have a PCP who is monitoring and refilling his blood pressure medicine.  Blood pressure today was 136/92.  EKG shows sinus rhythm at 75 with a QTC of 435 ms.  He underwent treadmill exercise stress testing last year which was negative for ischemia.  10/27/2022  Mr. Klauer is seen today in follow-up.  He reports no significant issues with palpitations.  He is due for refill of his flecainide.  He just underwent repeat treadmill stress testing.  This was negative for ischemia and showed no flecainide related EKG changes.  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 Marlou Starks:  Attempted laparoscopic and subsequent open appendectomy   BIOPSY  10/21/2018   Procedure: BIOPSY;  Surgeon: Leighton Ruff, MD;  Location: [facility];  Service: Endoscopy;;   CHOLECYSTECTOMY  12/31/2009   Dr. Excell Seltzer   COLON SURGERY  01/23/2010   partial colectomy   COLONOSCOPY N/A 10/21/2018   Procedure: COLONOSCOPY ERAS PATHWAY;  Surgeon: Leighton Ruff, MD;  Location: [facility];  Service: Endoscopy;  Laterality: N/A;   FLEXIBLE SIGMOIDOSCOPY N/A 09/13/2013   Procedure: FLEXIBLE SIGMOIDOSCOPY;  Surgeon: Leighton Ruff, MD;   Location: [facility];  Service: Endoscopy;  Laterality: N/A;   RIGHT COLECTOMY  12/31/2009   Dr Excell Seltzer: open resection for cecal diverticulitis    FAMHx:  Family History  Problem Relation Age of Onset   Cancer Mother     SOCHx:   reports that he quit smoking about 12 years ago. His smoking use included cigarettes. He has a 10.00 pack-year smoking history. He has never used smokeless tobacco. He reports that he does not drink alcohol and does not use drugs.  ALLERGIES:  No Known Allergies  ROS: Pertinent items noted in HPI and remainder of comprehensive ROS otherwise negative.  HOME MEDS: Current Outpatient Medications  Medication Sig Dispense Refill   acetaminophen (TYLENOL) 500 MG tablet Take 1,000 mg by mouth every 6 (six) hours as needed for moderate pain.     albuterol (VENTOLIN HFA) 108 (90 Base) MCG/ACT inhaler Inhale 2 puffs into the lungs every 6 (six) hours as needed for wheezing or shortness of breath. 1 each 11   alum & mag hydroxide-simeth (MAALOX/MYLANTA) 200-200-20 MG/5ML suspension Take 30 mLs by mouth every 6 (six) hours as needed for indigestion or heartburn (or bloating). 355 mL 0   aspirin EC 81 MG tablet Take 81 mg by mouth daily.     atorvastatin (LIPITOR) 40 MG tablet Take 1 tablet (40 mg total) by mouth daily. Patient must keep scheduled appointment for future refills 90 tablet 0   Chlorpheniramine-DM (CORICIDIN HBP COUGH/COLD PO) Take 1 tablet by mouth daily as needed (cough).     Cholecalciferol (VITAMIN D) 50 MCG (2000 UT) tablet Take 2,000 Units by mouth daily.     fluticasone-salmeterol (ADVAIR HFA) 115-21 MCG/ACT inhaler Inhale 2 puffs into the lungs 2 (two) times daily. 1 each 11   ibuprofen (ADVIL,MOTRIN) 200 MG tablet Take 400 mg by mouth every 6 (six) hours as needed for headache or moderate pain.     lisinopril (PRINIVIL,ZESTRIL) 20 MG tablet TAKE 1 TABLET BY MOUTH EVERY DAY 90 tablet 3   ondansetron (ZOFRAN) 4 MG tablet Take 1 tablet (4 mg  total) by mouth every 8 (eight) hours as needed for nausea or vomiting. 20 tablet 0   Propylene Glycol (SYSTANE COMPLETE OP) Place 2 drops into both eyes daily as needed (dry eyes).     tamsulosin (FLOMAX) 0.4 MG CAPS capsule Take 0.4 mg by mouth daily.     flecainide (TAMBOCOR) 100 MG tablet Take 1 tablet (100 mg total) by  mouth 2 (two) times daily. 180 tablet 3   pantoprazole (PROTONIX) 40 MG tablet Take 1 tablet (40 mg total) by mouth daily for 15 days. 15 tablet 0   No current facility-administered medications for this visit.    LABS/IMAGING: No results found for this or any previous visit (from the past 48 hour(s)). No results found.  VITALS: BP 138/86   Pulse 79   Ht '5\' 3"'$  (1.6 m)   Wt 157 lb 9.6 oz (71.5 kg)   SpO2 98%   BMI 27.92 kg/m   EXAM: General appearance: alert and no distress Neck: no carotid bruit, no JVD, and thyroid not enlarged, symmetric, no tenderness/mass/nodules Lungs: clear to auscultation bilaterally Heart: regular rate and rhythm, S1, S2 normal, no murmur, click, rub or gallop Abdomen: soft, non-tender; bowel sounds normal; no masses,  no organomegaly Extremities: extremities normal, atraumatic, no cyanosis or edema Pulses: 2+ and symmetric Skin: Skin color, texture, turgor normal. No rashes or lesions Neurologic: Grossly normal Psych: Pleasant  EKG: Deferred  ASSESSMENT: Ventricular bigeminy with nonconducted PVCs - suppressed on flecainide Chest pain with exertion - low risk exercise stress test (2023) Dyspnea on exertion - improved Hypertension Aortic atherosclerosis  PLAN: 1.   Mr. Strite reports no worsening palpitations or chest pain.  He underwent a recent repeat treadmill stress test which was negative for ischemia and showed no flecainide related EKG changes.  Will continue with the current dose of 100 twice daily flecainide and prescribe that today.  Blood pressure is controlled.  Plan follow-up with [place] translator in 1 year or  sooner as necessary.  Pixie Casino, MD, [facility], [place] Director of the [facility]  [facility] Attending Cardiologist  Direct Dial: [phone]  Fax: [phone]  Website:  [email or web address] 10/27/2022, 2:16 PM

## 2022-12-25 ENCOUNTER — Ambulatory Visit: Payer: Medicaid Other | Admitting: Internal Medicine

## 2023-02-15 ENCOUNTER — Other Ambulatory Visit: Payer: Self-pay | Admitting: Internal Medicine

## 2023-02-17 ENCOUNTER — Telehealth: Payer: Self-pay | Admitting: Internal Medicine

## 2023-02-17 MED ORDER — ATORVASTATIN CALCIUM 40 MG PO TABS
40.0000 mg | ORAL_TABLET | Freq: Every day | ORAL | 3 refills | Status: DC
Start: 2023-02-17 — End: 2023-11-22

## 2023-02-17 NOTE — Telephone Encounter (Signed)
LVM with daughter to please call back to our office

## 2023-02-17 NOTE — Telephone Encounter (Signed)
Spoke with daughter and verified medication needed is atorvastatin. She asked for refills to last until next appointment.  RX sent with 3 refills.

## 2023-02-17 NOTE — Telephone Encounter (Signed)
*  STAT* If patient is at the pharmacy, call can be transferred to refill team.   1. Which medications need to be refilled? (please list name of each medication and dose if known) atorvastatin (LIPITOR) 40 MG tablet   2. Which pharmacy/location (including street and city if local pharmacy) is medication to be sent to? [place], [place] - [address] AT [place]   3. Do they need a 30 day or 90 day supply? 90 day

## 2023-02-17 NOTE — Telephone Encounter (Signed)
Pt's daughter, Claiborne Billings called regarding a med refill (sent to refill team). She also asked if there will be plenty of refills to last to pts next year appt.

## 2023-04-14 ENCOUNTER — Ambulatory Visit (INDEPENDENT_AMBULATORY_CARE_PROVIDER_SITE_OTHER): Payer: Medicaid Other

## 2023-04-14 ENCOUNTER — Ambulatory Visit (HOSPITAL_BASED_OUTPATIENT_CLINIC_OR_DEPARTMENT_OTHER): Payer: Medicaid Other | Admitting: Pulmonary Disease

## 2023-04-14 ENCOUNTER — Encounter (HOSPITAL_BASED_OUTPATIENT_CLINIC_OR_DEPARTMENT_OTHER): Payer: Self-pay | Admitting: Pulmonary Disease

## 2023-04-14 VITALS — BP 120/80 | HR 76 | Temp 97.8°F | Wt 155.6 lb

## 2023-04-14 DIAGNOSIS — J4530 Mild persistent asthma, uncomplicated: Secondary | ICD-10-CM | POA: Diagnosis not present

## 2023-04-14 DIAGNOSIS — R0602 Shortness of breath: Secondary | ICD-10-CM

## 2023-04-14 MED ORDER — FLUTICASONE-SALMETEROL 230-21 MCG/ACT IN AERO
2.0000 | INHALATION_SPRAY | Freq: Two times a day (BID) | RESPIRATORY_TRACT | 11 refills | Status: DC
Start: 2023-04-14 — End: 2024-02-16

## 2023-04-14 NOTE — Patient Instructions (Signed)
Mild Persistent Asthma - stable --Increase Advair HFA 230-21 mg TWO puffs TWO times daily. This is your EVERYDAY medicine --CONTINUE Albuterol TWO puffs AS NEEDED. This is your EMERGENCY inhaler  Asthma Action Plan Use albuterol for worsening shortness of breath, wheezing and cough. If you symptoms do not improve in 24-48 hours, please our office for evaluation and/or prednisone taper.  Nasal congestion --START flonase 1-2 sprays per nostril

## 2023-04-14 NOTE — Progress Notes (Unsigned)
Subjective:   PATIENT ID: Eduardo West GENDER: male DOB: [birth year], MRN: [id number]   HPI  Chief Complaint  Patient presents with   Follow-up    Follow up.     Reason for Visit: Follow-up  [name] is a 62 year old former smoker with asthma, history pulmonary embolism who presents for follow-up. [place] interpretor present.  Since our last visit, he reports his asthma is well controlled. He is compliant with his Advair and rarely uses his albuterol. Denies chronic cough, shortness of breath or wheezing. He seems to have worsening dyspnea when laying down at night but this improves with sleeping on two pillows. He thinks this is related to his stomach issues. He was recently hospitalized for SBO last month which he intermittently has with his hx of in the past.   04/14/23 Symptoms well controlled during the day with Advair 115-21. Still has nasal congestion and nasal drip. Will have shortness of breath, cough and wheezing in the night. May use rescue at that time. Has not had any exacerbations that has been treated.  Social History: Previously smoked 20 years for 1/2 ppd. Quit 11 years in 2011. 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   Right leg numbness      Family History  Problem Relation Age of Onset   Cancer Mother      Social History   Occupational History   Occupation: unemployed  Tobacco Use   Smoking status: Former    Packs/day: 0.50    Years: 20.00    Additional pack years: 0.00    Total pack years: 10.00    Types: Cigarettes    Quit date: 11/30/2009    Years since quitting: 13.3    Smokeless tobacco: Never  Substance and Sexual Activity   Alcohol use: No   Drug use: No   Sexual activity: Not on file    No Known Allergies   Outpatient Medications Prior to Visit  Medication Sig Dispense Refill   acetaminophen (TYLENOL) 500 MG tablet Take 1,000 mg by mouth every 6 (six) hours as needed for moderate pain.     albuterol (VENTOLIN HFA) 108 (90 Base) MCG/ACT inhaler Inhale 2 puffs into the lungs every 6 (six) hours as needed for wheezing or shortness of breath. 1 each 11   alum & mag hydroxide-simeth (MAALOX/MYLANTA) 200-200-20 MG/5ML suspension Take 30 mLs by mouth every 6 (six) hours as needed for indigestion or heartburn (or bloating). 355 mL 0   aspirin EC 81 MG tablet Take 81 mg by mouth daily.     atorvastatin (LIPITOR) 40 MG tablet Take 1 tablet (40 mg total) by mouth daily. Patient must keep scheduled appointment for future refills 90 tablet 3   Chlorpheniramine-DM (CORICIDIN HBP COUGH/COLD PO) Take 1 tablet by mouth daily as needed (cough).     Cholecalciferol (VITAMIN D) 50 MCG (2000 UT) tablet Take 2,000 Units by mouth daily.     flecainide (TAMBOCOR) 100 MG tablet Take 1 tablet (100 mg total) by mouth 2 (two) times daily.  180 tablet 3   fluticasone-salmeterol (ADVAIR HFA) 115-21 MCG/ACT inhaler Inhale 2 puffs into the lungs 2 (two) times daily. 1 each 11   ibuprofen (ADVIL,MOTRIN) 200 MG tablet Take 400 mg by mouth every 6 (six) hours as needed for headache or moderate pain.     lisinopril (PRINIVIL,ZESTRIL) 20 MG tablet TAKE 1 TABLET BY MOUTH EVERY DAY 90 tablet 3   ondansetron (ZOFRAN) 4 MG tablet Take 1 tablet (4 mg total) by mouth every 8 (eight) hours as needed for nausea or vomiting. 20 tablet 0   Propylene Glycol (SYSTANE COMPLETE OP) Place 2 drops into both eyes daily as needed (dry eyes).     tamsulosin (FLOMAX) 0.4 MG CAPS capsule Take 0.4 mg by mouth daily.     pantoprazole (PROTONIX) 40 MG tablet Take 1 tablet (40 mg total) by mouth daily for 15 days.  15 tablet 0   No facility-administered medications prior to visit.    Review of Systems  Constitutional:  Negative for chills, diaphoresis, fever, malaise/fatigue and weight loss.  HENT:  Positive for congestion.   Respiratory:  Positive for cough, sputum production, shortness of breath and wheezing. Negative for hemoptysis.   Cardiovascular:  Negative for chest pain, palpitations and leg swelling.   Objective:   Vitals:   04/14/23 0911  BP: 120/80  Pulse: 76  Temp: 97.8 F (36.6 C)  TempSrc: Oral  SpO2: 98%  Weight: 155 lb 9.6 oz (70.6 kg)    SpO2: 98 % O2 Device: None (Room air)  Physical Exam: General: Well-appearing, no acute distress HENT: [place], AT Eyes: EOMI, no scleral icterus Respiratory: Clear to auscultation bilaterally.  No crackles, wheezing or rales Cardiovascular: RRR, -M/R/G, no JVD Extremities:-Edema,-tenderness Neuro: AAO x4, CNII-XII grossly intact Psych: Normal mood, normal affect  Data Reviewed:  Imaging: CXR 11/12/11 - Small loculated left pleural effusion with basilar scarring CXR 02/04/12 - Tiny left pleural effusion. Unchanged left basilar scarring CXR 04/04/12 - Left blunting of costophrenic angle CT A/P 10/10/12 - Bibasilar pleural effusions. Left basilar pleural thickening CT A/P 01/24/14 - Similar left basilar thickening. SBO. S/p right colectomy CT HR Chest 01/13/17 - Pleural parenchymal scarring CT Chest 02/28/21 -  No evidence of pleural effusion. Residual scarring in left lower lobe CT A/P 03/18/22 - SBO, colonic diverticulosis CXR 04/15/23 Unchanged LLL scarring. No infiltrate, effusion, effusion  PFT: 07/26/17  FVC 3.62 (97%) FEV1 2.81 (93%) Ratio 70  TLC 74% DLCO 87% Interpretation: Mixed mild obstructive and restrictive defect. No significant bronchodilator response  02/17/21 FVC 3.41 (93%) FEV1 2.67 (91%) Ratio 76  TLC 74% DLCO 82% Interpretation: Mild restrictive defect. No significant bronchodilator response  Labs: CBC     Component Value Date/Time   WBC 8.9 03/18/2022 0111   RBC 4.04 (L) 03/18/2022 0111   HGB 13.0 03/18/2022 0111   HCT 39.4 03/18/2022 0111   PLT 351 03/18/2022 0111   MCV 97.5 03/18/2022 0111   MCH 32.2 03/18/2022 0111   MCHC 33.0 03/18/2022 0111   RDW 11.9 03/18/2022 0111   LYMPHSABS 1.0 03/18/2022 0111   MONOABS 0.5 03/18/2022 0111   EOSABS 0.3 03/18/2022 0111   BASOSABS 0.0 03/18/2022 0111  Absolute eos 12/05/14 - 300    Assessment & Plan:   Discussion: 62 year old male with asthma, prior pleural effusion in 2011 with residual scarring and HTN who presents for follow-up. Controlled during the daytime with breakthrough night symptoms despite compliance. Discussed clinical course and management of asthma including bronchodilator regimen, preventive care  including vaccinations and action plan for exacerbation.  Mild Persistent Asthma - not controlled, not in exacerbation --Increase Advair HFA 230-21 mg TWO puffs TWO times daily. This is your EVERYDAY medicine --CONTINUE Albuterol TWO puffs AS NEEDED. This is your EMERGENCY inhaler  Asthma Action Plan Use albuterol for worsening shortness of breath, wheezing and cough. If you symptoms do not improve in 24-48 hours, please our office for evaluation and/or prednisone taper.  Nasal congestion --START flonase 1-2 sprays per nostril  Mild restrictive defect, lung scarring Chest imaging from 2011-2018 with chronic left pleural parenchymal scarring likely related to prior pleural effusion.  --CXR ordered Addendum: CXR reviewed. Unchanged scarring. No new effusions  Health Maintenance Immunization History  Administered Date(s) Administered   Influenza Inj Mdck Quad Pf 11/15/2018   Influenza Split 11/15/2018   Influenza, High Dose Seasonal PF 10/01/2020   Influenza, Seasonal, Injecte, Preservative Fre 11/16/2019   Influenza,inj,Quad PF,6+ Mos 09/30/2016   Influenza-Unspecified 08/14/2017   Moderna SARS-COV2 Booster Vaccination  10/01/2020   Moderna Sars-Covid-2 Vaccination 02/20/2020, 03/19/2020   PNEUMOCOCCAL CONJUGATE-20 04/30/2022   Tdap 04/30/2022   Unspecified SARS-COV-2 Vaccination 10/01/2020   Zoster Recombinat (Shingrix) 04/30/2022   CT Lung Screen - not indicated  Orders Placed This Encounter  Procedures   DG Chest 2 View    Standing Status:   Future    Number of Occurrences:   1    Standing Expiration Date:   04/13/2024    Order Specific Question:   Reason for Exam (SYMPTOM  OR DIAGNOSIS REQUIRED)    Answer:   shortness of breath    Order Specific Question:   Preferred imaging location?    Answer:   [facility]   Meds ordered this encounter  Medications   fluticasone-salmeterol (ADVAIR HFA) 230-21 MCG/ACT inhaler    Sig: Inhale 2 puffs into the lungs 2 (two) times daily.    Dispense:  1 each    Refill:  11    Return in about 1 year (around 04/13/2024).  I have spent a total time of 354-minutes on the day of the appointment including chart review, data review, collecting history, coordinating care and discussing medical diagnosis and plan with the patient/family. Past medical history, allergies, medications were reviewed. Pertinent imaging, labs and tests included in this note have been reviewed and interpreted independently by me.  Regino Fournet Mechele Collin, MD [facility] 04/14/2023 9:25 AM  Office Number [phone]

## 2023-04-16 ENCOUNTER — Telehealth: Payer: Self-pay | Admitting: Pulmonary Disease

## 2023-04-16 NOTE — Telephone Encounter (Signed)
JE, please advise.  

## 2023-04-16 NOTE — Telephone Encounter (Signed)
Called patient and daughter reports there was a miscommunication and Advair inhaler was covered. No further issues. Closing encounter

## 2023-04-19 ENCOUNTER — Other Ambulatory Visit (HOSPITAL_COMMUNITY): Payer: Self-pay

## 2023-11-03 ENCOUNTER — Telehealth: Payer: Self-pay | Admitting: Internal Medicine

## 2023-11-03 ENCOUNTER — Other Ambulatory Visit: Payer: Self-pay

## 2023-11-03 MED ORDER — FLECAINIDE ACETATE 100 MG PO TABS: 100.0000 mg | ORAL_TABLET | Freq: Two times a day (BID) | ORAL | 0 refills | Status: DC

## 2023-11-03 NOTE — Telephone Encounter (Signed)
*  STAT* If patient is at the pharmacy, call can be transferred to refill team.   1. Which medications need to be refilled? (please list name of each medication and dose if known)   flecainide (TAMBOCOR) 100 MG tablet    2. Which pharmacy/location (including street and city if local pharmacy) is medication to be sent to? [id number] - [place], [place] - [address] AT [facility] [address]    3. Do they need a 30 day or 90 day supply? 90 day

## 2023-11-03 NOTE — Telephone Encounter (Signed)
 RX sent to requested Pharmacy

## 2023-11-20 ENCOUNTER — Other Ambulatory Visit: Payer: Self-pay | Admitting: Internal Medicine

## 2023-12-06 ENCOUNTER — Telehealth: Payer: Self-pay | Admitting: *Deleted

## 2023-12-06 ENCOUNTER — Ambulatory Visit: Payer: Medicaid Other | Admitting: Internal Medicine

## 2023-12-06 NOTE — Telephone Encounter (Signed)
 Contacted patient via interpreter (ID: [id number]). Call would not connect at mobile number. LMOVM at home number [facility]) advising of appointment cancellation due to inclement weather. Will request that schedule call patient to reschedule.

## 2023-12-07 NOTE — Telephone Encounter (Signed)
 Patient has been rescheduled to 01/14/24 with Dr. Rennis Golden.

## 2024-01-14 ENCOUNTER — Encounter: Payer: Self-pay | Admitting: Internal Medicine

## 2024-01-14 ENCOUNTER — Ambulatory Visit: Payer: Medicaid Other | Attending: Internal Medicine | Admitting: Internal Medicine

## 2024-01-14 VITALS — BP 138/82 | HR 78 | Ht 63.0 in | Wt 147.2 lb

## 2024-01-14 DIAGNOSIS — Z5181 Encounter for therapeutic drug level monitoring: Secondary | ICD-10-CM

## 2024-01-14 DIAGNOSIS — R002 Palpitations: Secondary | ICD-10-CM

## 2024-01-14 DIAGNOSIS — E785 Hyperlipidemia, unspecified: Secondary | ICD-10-CM

## 2024-01-14 DIAGNOSIS — I10 Essential (primary) hypertension: Secondary | ICD-10-CM

## 2024-01-14 DIAGNOSIS — I493 Ventricular premature depolarization: Secondary | ICD-10-CM | POA: Diagnosis not present

## 2024-01-14 DIAGNOSIS — Z79899 Other long term (current) drug therapy: Secondary | ICD-10-CM

## 2024-01-14 MED ORDER — ATORVASTATIN CALCIUM 40 MG PO TABS: 40.0000 mg | ORAL_TABLET | Freq: Every day | ORAL | 3 refills | Status: AC

## 2024-01-14 MED ORDER — METOPROLOL SUCCINATE ER 25 MG PO TB24: 25.0000 mg | ORAL_TABLET | Freq: Every day | ORAL | 3 refills | Status: DC

## 2024-01-14 MED ORDER — FLECAINIDE ACETATE 100 MG PO TABS: 100.0000 mg | ORAL_TABLET | Freq: Two times a day (BID) | ORAL | 3 refills | Status: AC

## 2024-01-14 NOTE — Progress Notes (Signed)
OFFICE NOTE  Chief Complaint:  Follow-up  Primary Care Physician: Gwenyth Bender, MD  HPI:  Eduardo West is a pleasant 63 year old [place] he is male who is seen today with a Nurse, learning disability. He is referred by Dr. Mayford Knife for evaluation of an abnormal heart rhythm. EKG today shows ventricular bigeminy with ST and T wave abnormalities laterally concerning for ischemia. Eduardo West reports some increasing chest discomfort with exertion as well as shortness of breath. He says that he typically walks 30-45 minutes almost every day but recently has had worsening shortness of breath and his fatigue after only 5-10 minutes. He says that his chest discomfort is worse when exerting himself and released somewhat by rest. There is no significant family history of heart disease per his report although hypertension is his family. He was recently hospitalized for small bowel obstruction and has improved. He has had partial colon resection as well as appendectomy in the past. In January 2015 while hospitalized he was seen for chest pain by Dr. Garnette Scheuermann. Echocardiogram was recommended which showed an EF of 60-65% with mild diastolic dysfunction and moderate concentric LVH. There were no segmental wall motion abnormalities. He reports his symptoms have progressively gotten worse since his recent hospitalization.  Eduardo West returns for follow-up. At his last office visit I started him on low-dose beta blocker and he remains in ventricular bigeminy today. The electrical rate is 68 however his pulse rate is half of that at 34. He cannot express whether he has more fatigue or chest discomfort on the medicine than he had before. He certainly does not feel in any improvement on the beta blocker. Pulse rate may be slower.  I had the pleasure seeing Eduardo West back in the office today. In the interim he seen Dr. Lewayne Bunting twice, who started him on flecainide and increased dose to 100 mg twice a day. This seems to have been  effective in suppressing his PVCs. He does feel somewhat better and has more energy and is less short of breath.  I saw Eduardo West back in the office today with an interpreter. Overall he is doing fairly well. He is currently on flecainide 100 mg twice a day. He reports nearly complete resolution of his palpitations. His EKG shows no evidence of bigeminy. He says he gets occasional sharp chest pain but no significant symptoms that sound anginal. He also has some mild shortness of breath. He underwent a stress test last year which was negative for ischemia. Blood pressure appears well controlled on Dyazide.  10/28/2016  Eduardo West returns today for follow-up. He is accompanied by a [place] interpreter. Overall he feels well. He denies any significant palpitations. He has been on flecainide 100 mg twice a day. QTc interval is 467 ms today with EKG demonstrating sinus rhythm and first-degree AV block at 212 ms. He recently was in the hospital for abdominal pain and was given some hydrocodone with improvement. The etiology was unclear. He's not had any significant palpitations. He denies any chest pain. He had a stress test in April of this year which was nonischemic. He will need another stress test in 2 years for monitoring of flecainide therapy.  08/03/2017  Eduardo West returns today for follow-up. Recently he was seen by Azalee Course, PA-C for episodes of chest pain at rest and with exertion. Amlodipine was added for antianginal benefit however he says that he feels dizzy on this medication. Blood pressure is low normal today at 112/88. I  suspect he may be getting hypotensive with it. He does not feel that it's improved his chest discomfort. It is also notable that he is not taking aspirin daily. He was supposed to be on this medicine. I went over this with him in the interpreter today and provided samples of 81 mg aspirin. He should take this daily. This is a treatment for coronary  disease.  11/11/2017  Eduardo West returns today for follow-up.  He is accompanied by an interpreter.  When I last saw him we readjusted his medications, namely we discontinued his amlodipine.  He was having problems with swelling and he had no improvement in his chest discomfort.  At times he was also having some hypotension.  Blood pressure was retested today and was initially elevated 152/90 however recheck came back at 124/80 in the left arm and 130/88 in the right arm.  He remains on flecainide and has had no recurrent atrial fibrillation.  EKG today shows sinus rhythm with a QTC of 425 ms.  11/11/2018  Eduardo West is seen today in follow-up with the [place] translator.  Symptomatically he is doing well.  Blood pressure seems to be still elevated.  He denies any palpitations and is tolerating his flecainide.  His last stress test was 2 years ago.  EKG shows sinus rhythm with first-degree AV block and QTC of 360 ms.  He denies chest pain or worsening shortness of breath.  12/21/2018  Eduardo West is seen today in follow-up.  He is accompanied by [place].  Symptom wise he denies any palpitations.  He feels like he is doing well on the flecainide.  He does get some chest discomfort but only when he is under stress or if he is arguing with his teenage daughters.  Apparently he has 3 daughters at home.  He did undergo a stress test which was routine for monitoring on flecainide.  This did not demonstrate any ischemic EKG changes nor any QRS widening associated with the flecainide.  08/02/2020  Eduardo West returns today for follow-up.  Overall he is doing well.  He is again accompanied by by a [place] translator.  He denies any chest pain or shortness of breath.  He has near complete resolution of his PVCs.  Overall he is tolerating the flecainide well.  He had stress testing last year which was negative for ischemia.  09/29/2021  Eduardo West returns today for follow-up.  He was seen  with a professional [place].  He denies any chest pain or worsening shortness of breath.  He reports his palpitations are well controlled.  He says he is close to running out of his flecainide and needs refills of that and his Lipitor.  His last cholesterol check however was in 2019.  He is compliant with the medicine but has not been rechecked to my knowledge.  He does have a PCP who is monitoring and refilling his blood pressure medicine.  Blood pressure today was 136/92.  EKG shows sinus rhythm at 75 with a QTC of 435 ms.  He underwent treadmill exercise stress testing last year which was negative for ischemia.  10/27/2022  Eduardo West is seen today in follow-up.  He reports no significant issues with palpitations.  He is due for refill of his flecainide.  He just underwent repeat treadmill stress testing.  This was negative for ischemia and showed no flecainide related EKG changes.  01/14/2024  Eduardo West returns today for follow-up.  He says he is having some  intermittent palpitations.  They are short-lived but happens several times a week.  He remains on flecainide 100 mg twice daily.  He last had a stress test in 2023 which was negative for any ischemia or flecainide related EKG changes.  He is seen today with a professional [place] Pharmacist, community.  He is due for refills of atorvastatin and his flecainide.  He denies any chest pain or shortness of breath.  PMHx:  Past Medical History:  Diagnosis Date   Abdominal pain    Buzzing in ear    Chronic diarrhea 03/04/2012   Colon polyp    Diarrhea    Diverticulitis of cecum 12/2009   Resected 2011:  COLON, SEGMENTAL RESECTION, RIGHT AND TERMINAL ILEUM : - SEGMENT OF COLON   Hypertension    Kidney stones    Pleural effusion, left    Positive PPD 12/08/10   Right leg numbness     Past Surgical History:  Procedure Laterality Date   APPENDECTOMY  11/30/2001   Dr. Carolynne Edouard:  Attempted laparoscopic and subsequent open appendectomy   BIOPSY   10/21/2018   Procedure: BIOPSY;  Surgeon: Romie Levee, MD;  Location: [facility];  Service: Endoscopy;;   CHOLECYSTECTOMY  12/31/2009   Dr. Johna Sheriff   COLON SURGERY  01/23/2010   partial colectomy   COLONOSCOPY N/A 10/21/2018   Procedure: COLONOSCOPY ERAS PATHWAY;  Surgeon: Romie Levee, MD;  Location: [facility];  Service: Endoscopy;  Laterality: N/A;   FLEXIBLE SIGMOIDOSCOPY N/A 09/13/2013   Procedure: FLEXIBLE SIGMOIDOSCOPY;  Surgeon: Romie Levee, MD;  Location: [facility];  Service: Endoscopy;  Laterality: N/A;   RIGHT COLECTOMY  12/31/2009   Dr Johna Sheriff: open resection for cecal diverticulitis    FAMHx:  Family History  Problem Relation Age of Onset   Cancer Mother     SOCHx:   reports that he quit smoking about 14 years ago. His smoking use included cigarettes. He started smoking about 34 years ago. He has a 10 pack-year smoking history. He has never used smokeless tobacco. He reports that he does not drink alcohol and does not use drugs.  ALLERGIES:  No Known Allergies  ROS: Pertinent items noted in HPI and remainder of comprehensive ROS otherwise negative.  HOME MEDS: Current Outpatient Medications  Medication Sig Dispense Refill   acetaminophen (TYLENOL) 500 MG tablet Take 1,000 mg by mouth every 6 (six) hours as needed for moderate pain.     albuterol (VENTOLIN HFA) 108 (90 Base) MCG/ACT inhaler Inhale 2 puffs into the lungs every 6 (six) hours as needed for wheezing or shortness of breath. 1 each 11   alum & mag hydroxide-simeth (MAALOX/MYLANTA) 200-200-20 MG/5ML suspension Take 30 mLs by mouth every 6 (six) hours as needed for indigestion or heartburn (or bloating). 355 mL 0   aspirin EC 81 MG tablet Take 81 mg by mouth daily.     atorvastatin (LIPITOR) 40 MG tablet TAKE 1 TABLET(40 MG) BY MOUTH DAILY 90 tablet 0   Chlorpheniramine-DM (CORICIDIN HBP COUGH/COLD PO) Take 1 tablet by mouth daily as needed (cough).     Cholecalciferol (VITAMIN D) 50 MCG  (2000 UT) tablet Take 2,000 Units by mouth daily.     flecainide (TAMBOCOR) 100 MG tablet Take 1 tablet (100 mg total) by mouth 2 (two) times daily. 180 tablet 0   fluticasone-salmeterol (ADVAIR HFA) 230-21 MCG/ACT inhaler Inhale 2 puffs into the lungs 2 (two) times daily. 1 each 11   ibuprofen (ADVIL,MOTRIN) 200 MG tablet Take 400 mg by mouth  every 6 (six) hours as needed for headache or moderate pain.     lisinopril (PRINIVIL,ZESTRIL) 20 MG tablet TAKE 1 TABLET BY MOUTH EVERY DAY 90 tablet 3   ondansetron (ZOFRAN) 4 MG tablet Take 1 tablet (4 mg total) by mouth every 8 (eight) hours as needed for nausea or vomiting. 20 tablet 0   Propylene Glycol (SYSTANE COMPLETE OP) Place 2 drops into both eyes daily as needed (dry eyes).     tamsulosin (FLOMAX) 0.4 MG CAPS capsule Take 0.4 mg by mouth daily.     pantoprazole (PROTONIX) 40 MG tablet Take 1 tablet (40 mg total) by mouth daily for 15 days. 15 tablet 0   No current facility-administered medications for this visit.    LABS/IMAGING: No results found for this or any previous visit (from the past 48 hours). No results found.  VITALS: BP 138/82 (BP Location: Left Arm, Patient Position: Sitting, Cuff Size: Normal)   Pulse 78   Ht 5\' 3"  (1.6 m)   Wt 147 lb 3.2 oz (66.8 kg)   SpO2 98%   BMI 26.08 kg/m   EXAM: General appearance: alert and no distress Neck: no carotid bruit, no JVD, and thyroid not enlarged, symmetric, no tenderness/mass/nodules Lungs: clear to auscultation bilaterally Heart: regular rate and rhythm, S1, S2 normal, no murmur, click, rub or gallop Abdomen: soft, non-tender; bowel sounds normal; no masses,  no organomegaly Extremities: extremities normal, atraumatic, no cyanosis or edema Pulses: 2+ and symmetric Skin: Skin color, texture, turgor normal. No rashes or lesions Neurologic: Grossly normal Psych: Pleasant  EKG: EKG Interpretation Date/Time:  Friday January 14 2024 13:36:22 EST Ventricular Rate:  78 PR  Interval:  210 QRS Duration:  110 QT Interval:  406 QTC Calculation: 462 R Axis:   -50  Text Interpretation: Sinus rhythm with 1st degree A-V block Left axis deviation Incomplete right bundle branch block When compared with ECG of 25-Jun-2017 13:23, No significant change since last tracing Confirmed by Zoila Shutter [phone]) on 01/14/2024 1:43:55 PM    ASSESSMENT: Ventricular bigeminy with nonconducted PVCs - suppressed on flecainide Chest pain with exertion - low risk exercise stress test (2023) Dyspnea on exertion - improved Hypertension Aortic atherosclerosis  PLAN: 1.   Mr. Kutzer he is reporting some breakthrough palpitations which occur but are infrequent.  He is on a high dose of flecainide but not on a beta-blocker.  He may be that he is having some issue with that.  Heart rates are in the 70s and blood pressure is adequate to allow possibly addition of beta-blockade.  I would recommend adding Toprol-XL 25 mg at night.  Will renew his atorvastatin.  His last lipid profile showed an LDL 56.  Plan follow-up with me in 1 year or sooner as necessary at which time we will repeat exercise stress testing.  Chrystie Nose, MD, [facility], FACP  [place]  [facility]   Medical Director of the [facility]  Card[facility] Clinic Attending Cardiologist  Direct Dial: [phone]  Fax: [phone]  Website:  [email or web address] Nicely Odin Mariani 01/14/2024, 1:44 PM

## 2024-01-14 NOTE — Patient Instructions (Signed)
Medication Instructions:  Start: Metoprolol succinate (Toprol XL) 25 mg once daily at bedtime *If you need a refill on your cardiac medications before your next appointment, please call your pharmacy*   Lab Work: None  Testing/Procedures: Exercise Treadmill test in one year (for Flecainide Therapy), prior to seeing Dr. Rennis Golden for follow up.   Follow-Up: At [facility], you and your health needs are our priority.  As part of our continuing mission to provide you with exceptional heart care, we have created designated Provider Care Teams.  These Care Teams include your primary Cardiologist (physician) and Advanced Practice Providers (APPs -  Physician Assistants and Nurse Practitioners) who all work together to provide you with the care you need, when you need it.  Your next appointment:   12 month(s)  Provider:   Chrystie Nose, MD

## 2024-01-17 ENCOUNTER — Telehealth: Payer: Self-pay | Admitting: Pulmonary Disease

## 2024-01-17 NOTE — Telephone Encounter (Signed)
 Tresa Endo daughter states patient needs prior authorization for Advair. Pharmacy is [facility] [address] [place] phone number is [phone].

## 2024-01-18 ENCOUNTER — Telehealth: Payer: Self-pay

## 2024-01-18 ENCOUNTER — Other Ambulatory Visit (HOSPITAL_COMMUNITY): Payer: Self-pay

## 2024-01-18 NOTE — Telephone Encounter (Signed)
 Patient's daughter is calling for an update on Advair. She was notified of the price. Patient needs refill

## 2024-01-18 NOTE — Telephone Encounter (Signed)
 Per test claim patient ins covers Brand Name Advair HFA- $4.00

## 2024-01-18 NOTE — Telephone Encounter (Signed)
*  Pulm  Pharmacy Patient Advocate Encounter   Received notification from Pt Calls Messages that prior authorization for Advair HFA is required/requested.   Insurance verification completed.   The patient is insured through [employer] .   Per test claim:  Brand Advair HFA is preferred by the insurance.  If suggested medication is appropriate, Please send in a new RX and discontinue this one. If not, please advise as to why it's not appropriate so that we may request a Prior Authorization. Please note, some preferred medications may still require a PA.  If the suggested medications have not been trialed and there are no contraindications to their use, the PA will not be submitted, as it will not be approved.

## 2024-01-19 NOTE — Telephone Encounter (Signed)
 Pharmacy has to order med but will have this week. Pt notified NFN

## 2024-01-19 NOTE — Telephone Encounter (Signed)
 Pharmacy has to order rx but will have for patient. Pt notified NFN

## 2024-02-16 ENCOUNTER — Encounter (HOSPITAL_BASED_OUTPATIENT_CLINIC_OR_DEPARTMENT_OTHER): Payer: Self-pay | Admitting: Pulmonary Disease

## 2024-02-16 ENCOUNTER — Ambulatory Visit (HOSPITAL_BASED_OUTPATIENT_CLINIC_OR_DEPARTMENT_OTHER): Payer: Medicaid Other | Admitting: Pulmonary Disease

## 2024-02-16 VITALS — BP 132/80 | HR 69 | Ht 63.0 in | Wt 147.5 lb

## 2024-02-16 DIAGNOSIS — J4530 Mild persistent asthma, uncomplicated: Secondary | ICD-10-CM | POA: Diagnosis not present

## 2024-02-16 MED ORDER — FLUTICASONE-SALMETEROL 230-21 MCG/ACT IN AERO
2.0000 | INHALATION_SPRAY | Freq: Two times a day (BID) | RESPIRATORY_TRACT | 3 refills | Status: AC
Start: 2024-02-16 — End: ?

## 2024-02-16 MED ORDER — FLUTICASONE-SALMETEROL 230-21 MCG/ACT IN AERO: 2.0000 | INHALATION_SPRAY | Freq: Two times a day (BID) | RESPIRATORY_TRACT | 11 refills | Status: DC

## 2024-02-16 NOTE — Progress Notes (Signed)
 Subjective:   PATIENT ID: Eduardo West GENDER: male DOB: 14-Sep-1961, MRN: [id number]   HPI  Chief Complaint  Patient presents with   Follow-up    Asthma    Reason for Visit: Follow-up  [name] is a 63 year old former smoker with asthma, history pulmonary embolism who presents for follow-up. Vietnamese interpretor present.  Since our last visit, he reports his asthma is well controlled. He is compliant with his Advair and rarely uses his albuterol. Denies chronic cough, shortness of breath or wheezing. He seems to have worsening dyspnea when laying down at night but this improves with sleeping on two pillows. He thinks this is related to his stomach issues. He was recently hospitalized for SBO last month which he intermittently has with his hx of in the past.   04/14/23 Symptoms well controlled during the day with Advair 115-21. Still has nasal congestion and nasal drip. Will have shortness of breath, cough and wheezing in the night. May use rescue at that time. Has not had any exacerbations that has been treated.  02/16/24 Since our last visit he has been compliant with his Advair 230-21 however has had difficulty with obtaining his inhaler due to a prior authorization or sometimes he has to wait due to supply issues so sometimes he is without an inhaler for a few days. Denies shortness of breath, cough or wheezing. No exacerbations since our last visit. No nighttime symptoms.  Social History: Previously smoked 20 years for 1/2 ppd. Quit 11 years in 2011. 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   Right leg numbness       Family History  Problem Relation Age of Onset   Cancer Mother      Social History   Occupational History   Occupation: unemployed  Tobacco Use   Smoking status: Former    Current packs/day: 0.00    Average packs/day: 0.5 packs/day for 20.0 years (10.0 ttl pk-yrs)    Types: Cigarettes    Start date: 11/30/1989    Quit date: 11/30/2009    Years since quitting: 14.2   Smokeless tobacco: Never  Substance and Sexual Activity   Alcohol use: No   Drug use: No   Sexual activity: Not on file    No Known Allergies   Outpatient Medications Prior to Visit  Medication Sig Dispense Refill   acetaminophen (TYLENOL) 500 MG tablet Take 1,000 mg by mouth every 6 (six) hours as needed for moderate pain.     albuterol (VENTOLIN HFA) 108 (90 Base) MCG/ACT inhaler Inhale 2 puffs into the lungs every 6 (six) hours as needed for wheezing or shortness of breath. 1 each 11   alum & mag hydroxide-simeth (MAALOX/MYLANTA) 200-200-20 MG/5ML suspension Take 30 mLs by mouth every 6 (six) hours as needed for indigestion or heartburn (or bloating). 355 mL 0   aspirin EC 81 MG tablet Take 81 mg by mouth daily.     atorvastatin (LIPITOR) 40 MG tablet Take 1 tablet (40 mg total) by mouth daily. 90 tablet 3  Chlorpheniramine-DM (CORICIDIN HBP COUGH/COLD PO) Take 1 tablet by mouth daily as needed (cough).     Cholecalciferol (VITAMIN D) 50 MCG (2000 UT) tablet Take 2,000 Units by mouth daily.     flecainide (TAMBOCOR) 100 MG tablet Take 1 tablet (100 mg total) by mouth 2 (two) times daily. 180 tablet 3   ibuprofen (ADVIL,MOTRIN) 200 MG tablet Take 400 mg by mouth every 6 (six) hours as needed for headache or moderate pain.     lisinopril (PRINIVIL,ZESTRIL) 20 MG tablet TAKE 1 TABLET BY MOUTH EVERY DAY 90 tablet 3   metoprolol succinate (TOPROL XL) 25 MG 24 hr tablet Take 1 tablet (25 mg total) by mouth at bedtime. 90 tablet 3   ondansetron (ZOFRAN) 4 MG tablet Take 1 tablet (4 mg total) by mouth every 8 (eight)  hours as needed for nausea or vomiting. 20 tablet 0   Propylene Glycol (SYSTANE COMPLETE OP) Place 2 drops into both eyes daily as needed (dry eyes).     tamsulosin (FLOMAX) 0.4 MG CAPS capsule Take 0.4 mg by mouth daily.     fluticasone-salmeterol (ADVAIR HFA) 230-21 MCG/ACT inhaler Inhale 2 puffs into the lungs 2 (two) times daily. 1 each 11   pantoprazole (PROTONIX) 40 MG tablet Take 1 tablet (40 mg total) by mouth daily for 15 days. 15 tablet 0   No facility-administered medications prior to visit.    Review of Systems  Constitutional:  Negative for chills, diaphoresis, fever, malaise/fatigue and weight loss.  HENT:  Negative for congestion.   Respiratory:  Negative for cough, hemoptysis, sputum production, shortness of breath and wheezing.   Cardiovascular:  Negative for chest pain, palpitations and leg swelling.   Objective:   Vitals:   02/16/24 0847  BP: 132/80  Pulse: 69  SpO2: 98%  Weight: 147 lb 8 oz (66.9 kg)  Height: 5\' 3"  (1.6 m)    SpO2: 98 %  Physical Exam: General: Well-appearing, no acute distress HENT: [place], AT Eyes: EOMI, no scleral icterus Respiratory: Clear to auscultation bilaterally.  No crackles, wheezing or rales Cardiovascular: RRR, -M/R/G, no JVD Extremities:-Edema,-tenderness Neuro: AAO x4, CNII-XII grossly intact Psych: Normal mood, normal affect  Data Reviewed:  Imaging: CXR 11/12/11 - Small loculated left pleural effusion with basilar scarring CXR 02/04/12 - Tiny left pleural effusion. Unchanged left basilar scarring CXR 04/04/12 - Left blunting of costophrenic angle CT A/P 10/10/12 - Bibasilar pleural effusions. Left basilar pleural thickening CT A/P 01/24/14 - Similar left basilar thickening. SBO. S/p right colectomy CT HR Chest 01/13/17 - Pleural parenchymal scarring CT Chest 02/28/21 -  No evidence of pleural effusion. Residual scarring in left lower lobe CT A/P 03/18/22 - SBO, colonic diverticulosis CXR 04/15/23 Unchanged LLL scarring. No  infiltrate, effusion, effusion  PFT: 07/26/17  FVC 3.62 (97%) FEV1 2.81 (93%) Ratio 70  TLC 74% DLCO 87% Interpretation: Mixed mild obstructive and restrictive defect. No significant bronchodilator response  02/17/21 FVC 3.41 (93%) FEV1 2.67 (91%) Ratio 76  TLC 74% DLCO 82% Interpretation: Mild restrictive defect. No significant bronchodilator response  Labs: CBC    Component Value Date/Time   WBC 8.9 03/18/2022 0111   RBC 4.04 (L) 03/18/2022 0111   HGB 13.0 03/18/2022 0111   HCT 39.4 03/18/2022 0111   PLT 351 03/18/2022 0111   MCV 97.5 03/18/2022 0111   MCH 32.2 03/18/2022 0111   MCHC 33.0 03/18/2022 0111   RDW 11.9 03/18/2022 0111   LYMPHSABS 1.0 03/18/2022 0111   MONOABS 0.5 03/18/2022 0111  EOSABS 0.3 03/18/2022 0111   BASOSABS 0.0 03/18/2022 0111  Absolute eos 12/05/14 - 300    Assessment & Plan:   Discussion: 63 year old male with asthma, prior pleural effusion in 2011 with residual scarring and HTN who presents for follow-up. Discussed clinical course and management of asthma including bronchodilator regimen, preventive care and action plan for exacerbation.  Mild Persistent Asthma - controlled, not in exacerbation --CONTINUE Advair HFA 230-21 mg TWO puffs TWO times daily. This is your EVERYDAY medicine --CONTINUE Albuterol TWO puffs AS NEEDED. This is your EMERGENCY inhaler  Asthma Action Plan Use albuterol for worsening shortness of breath, wheezing and cough. If you symptoms do not improve in 24-48 hours, please our office for evaluation and/or prednisone taper.  Mild restrictive defect, lung scarring Chest imaging from 2011-2018 with chronic left pleural parenchymal scarring likely related to prior pleural effusion.  CXR with unchanged scarring. No new effusions  Health Maintenance Immunization History  Administered Date(s) Administered   Influenza Inj Mdck Quad Pf 11/15/2018   Influenza Split 11/15/2018   Influenza, High Dose Seasonal PF 10/01/2020    Influenza, Seasonal, Injecte, Preservative Fre 11/16/2019   Influenza,inj,Quad PF,6+ Mos 09/30/2016   Influenza-Unspecified 08/14/2017   Moderna Covid-19 Fall Seasonal Vaccine 49yrs & older 11/17/2023   Moderna SARS-COV2 Booster Vaccination 10/01/2020   Moderna Sars-Covid-2 Vaccination 02/20/2020, 03/19/2020   PNEUMOCOCCAL CONJUGATE-20 04/30/2022   Tdap 04/30/2022   Unspecified SARS-COV-2 Vaccination 10/01/2020   Zoster Recombinant(Shingrix) 04/30/2022   CT Lung Screen - not indicated  No orders of the defined types were placed in this encounter.  Meds ordered this encounter  Medications   DISCONTD: fluticasone-salmeterol (ADVAIR HFA) 230-21 MCG/ACT inhaler    Sig: Inhale 2 puffs into the lungs 2 (two) times daily.    Dispense:  1 each    Refill:  11   fluticasone-salmeterol (ADVAIR HFA) 230-21 MCG/ACT inhaler    Sig: Inhale 2 puffs into the lungs 2 (two) times daily.    Dispense:  36 g    Refill:  3    Please distribute 3 inhalers.    Return in about 8 months (around 10/18/2024).  I have spent a total time of 30-minutes on the day of the appointment including chart review, data review, collecting history, coordinating care and discussing medical diagnosis and plan with the patient/family. Past medical history, allergies, medications were reviewed. Pertinent imaging, labs and tests included in this note have been reviewed and interpreted independently by me.  Andromeda Poppen Mechele Collin, MD [facility] 02/16/2024 9:09 AM  Office Number [phone]

## 2024-02-16 NOTE — Patient Instructions (Signed)
  Mild Persistent Asthma - controlled --CONTINUE Advair HFA 230-21 mg TWO puffs TWO times daily. This is your EVERYDAY medicine --CONTINUE Albuterol TWO puffs AS NEEDED. This is your EMERGENCY inhaler  Asthma Action Plan Use albuterol for worsening shortness of breath, wheezing and cough. If you symptoms do not improve in 24-48 hours, please our office for evaluation and/or prednisone taper.

## 2024-12-29 ENCOUNTER — Telehealth (HOSPITAL_COMMUNITY): Payer: Self-pay | Admitting: *Deleted

## 2024-12-29 NOTE — Telephone Encounter (Signed)
 Instructions left on VM for stress test by interpreter services.

## 2025-01-01 ENCOUNTER — Other Ambulatory Visit: Payer: Self-pay | Admitting: Internal Medicine

## 2025-01-03 ENCOUNTER — Other Ambulatory Visit: Payer: Self-pay | Admitting: *Deleted

## 2025-01-03 DIAGNOSIS — Z5181 Encounter for therapeutic drug level monitoring: Secondary | ICD-10-CM

## 2025-01-03 NOTE — Telephone Encounter (Signed)
 Refill sent

## 2025-01-03 NOTE — Progress Notes (Unsigned)
"    Placed attestation for Dr Mona to sign and routed   Order was place at  last office appointment  01/14/24 "

## 2025-01-04 ENCOUNTER — Ambulatory Visit (HOSPITAL_COMMUNITY)
Admission: RE | Admit: 2025-01-04 | Discharge: 2025-01-04 | Disposition: A | Source: Ambulatory Visit | Attending: Internal Medicine | Admitting: Internal Medicine

## 2025-01-04 DIAGNOSIS — Z79899 Other long term (current) drug therapy: Secondary | ICD-10-CM | POA: Diagnosis not present

## 2025-01-04 DIAGNOSIS — Z5181 Encounter for therapeutic drug level monitoring: Secondary | ICD-10-CM

## 2025-01-04 LAB — EXERCISE TOLERANCE TEST
Angina Index: 0
Duke Treadmill Score: 7
Estimated workload: 7.3
Exercise duration (min): 6 min
Exercise duration (sec): 38 s
MPHR: 159 {beats}/min
Peak HR: 130 {beats}/min
Percent HR: 81 %
Rest HR: 73 {beats}/min
ST Depression (mm): 0 mm

## 2025-01-05 ENCOUNTER — Ambulatory Visit: Payer: Self-pay | Admitting: Internal Medicine
